# Patient Record
Sex: Male | Born: 1960 | Race: Black or African American | Hispanic: No | Smoking: Heavy tobacco smoker
Health system: Southern US, Community
[De-identification: ages and names within clinical notes are randomized; demographics above are authoritative.]

## PROBLEM LIST (undated history)

## (undated) DIAGNOSIS — J45909 Unspecified asthma, uncomplicated: Secondary | ICD-10-CM

## (undated) DIAGNOSIS — E119 Type 2 diabetes mellitus without complications: Secondary | ICD-10-CM

## (undated) DIAGNOSIS — K219 Gastro-esophageal reflux disease without esophagitis: Secondary | ICD-10-CM

## (undated) DIAGNOSIS — I499 Cardiac arrhythmia, unspecified: Secondary | ICD-10-CM

## (undated) DIAGNOSIS — I509 Heart failure, unspecified: Secondary | ICD-10-CM

## (undated) DIAGNOSIS — F32A Depression, unspecified: Secondary | ICD-10-CM

## (undated) DIAGNOSIS — E7800 Pure hypercholesterolemia, unspecified: Secondary | ICD-10-CM

## (undated) DIAGNOSIS — G8929 Other chronic pain: Secondary | ICD-10-CM

## (undated) DIAGNOSIS — F209 Schizophrenia, unspecified: Secondary | ICD-10-CM

## (undated) DIAGNOSIS — J189 Pneumonia, unspecified organism: Secondary | ICD-10-CM

## (undated) DIAGNOSIS — Z8719 Personal history of other diseases of the digestive system: Secondary | ICD-10-CM

## (undated) DIAGNOSIS — J449 Chronic obstructive pulmonary disease, unspecified: Secondary | ICD-10-CM

## (undated) DIAGNOSIS — J42 Unspecified chronic bronchitis: Secondary | ICD-10-CM

## (undated) DIAGNOSIS — F329 Major depressive disorder, single episode, unspecified: Secondary | ICD-10-CM

## (undated) DIAGNOSIS — M1990 Unspecified osteoarthritis, unspecified site: Secondary | ICD-10-CM

## (undated) DIAGNOSIS — F319 Bipolar disorder, unspecified: Secondary | ICD-10-CM

## (undated) DIAGNOSIS — F419 Anxiety disorder, unspecified: Secondary | ICD-10-CM

## (undated) DIAGNOSIS — M25559 Pain in unspecified hip: Secondary | ICD-10-CM

## (undated) DIAGNOSIS — I10 Essential (primary) hypertension: Secondary | ICD-10-CM

## (undated) HISTORY — PX: CLOSED REDUCTION HAND FRACTURE: SHX973

## (undated) HISTORY — PX: ELBOW FRACTURE SURGERY: SHX616

## (undated) HISTORY — PX: FRACTURE SURGERY: SHX138

---

## 2013-04-28 ENCOUNTER — Encounter (HOSPITAL_BASED_OUTPATIENT_CLINIC_OR_DEPARTMENT_OTHER): Payer: Self-pay | Admitting: Emergency Medicine

## 2013-04-28 ENCOUNTER — Emergency Department (HOSPITAL_BASED_OUTPATIENT_CLINIC_OR_DEPARTMENT_OTHER)
Admission: EM | Admit: 2013-04-28 | Discharge: 2013-04-28 | Disposition: A | Discharge status: Home / Self Care | Payer: Medicaid Other | Attending: Emergency Medicine | Admitting: Emergency Medicine

## 2013-04-28 DIAGNOSIS — J441 Chronic obstructive pulmonary disease with (acute) exacerbation: Secondary | ICD-10-CM | POA: Insufficient documentation

## 2013-04-28 HISTORY — DX: Bipolar disorder, unspecified: F31.9

## 2013-04-28 HISTORY — DX: Heart failure, unspecified: I50.9

## 2013-04-28 HISTORY — DX: Other chronic pain: G89.29

## 2013-04-28 HISTORY — DX: Chronic obstructive pulmonary disease, unspecified: J44.9

## 2013-04-28 HISTORY — DX: Essential (primary) hypertension: I10

## 2013-04-28 HISTORY — DX: Pain in unspecified hip: M25.559

## 2013-04-28 MED ORDER — ALBUTEROL SULFATE (2.5 MG/3ML) 0.083% IN NEBU: 2.5000 mg | INHALATION_SOLUTION | RESPIRATORY_TRACT | Status: DC | PRN

## 2013-04-28 MED ORDER — ALBUTEROL SULFATE (5 MG/ML) 0.5% IN NEBU
15.0000 mg/h | INHALATION_SOLUTION | Freq: Once | RESPIRATORY_TRACT | Status: AC
  Administered 2013-04-28: 15 mg/h via RESPIRATORY_TRACT
  Filled 2013-04-28: qty 3

## 2013-04-28 MED ORDER — PREDNISONE 20 MG PO TABS: 60.0000 mg | ORAL_TABLET | Freq: Every day | ORAL | Status: DC

## 2013-04-28 MED ORDER — ALBUTEROL SULFATE HFA 108 (90 BASE) MCG/ACT IN AERS
2.0000 | INHALATION_SPRAY | RESPIRATORY_TRACT | Status: DC | PRN
  Administered 2013-04-28: 2 via RESPIRATORY_TRACT
  Filled 2013-04-28: qty 6.7

## 2013-04-28 NOTE — ED Provider Notes (Signed)
CSN: [id number]     Arrival date & time 04/28/13  1721 History   First MD Initiated Contact with Patient 04/28/13 1722     No chief complaint on file.  (Consider location/radiation/quality/duration/timing/severity/associated sxs/prior Treatment) HPI Pt with history of ?COPD or asthma who continues to smoke brought to the ED by EMS for 2-3 days of SOB, wheezing and cough but no fever. States he was seen at [facility] recently and discharged. He reports he is at [facility] 'constantly'. 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 Given Albuterol/Atrovent and solu-medrol by EMS with some improvement in symptoms.    No past medical history on file. No past surgical history on file. No family history on file. History  Substance Use Topics  . Smoking status: Not on file  . Smokeless tobacco: Not on file  . Alcohol Use: Not on file    Review of Systems All other systems reviewed and are negative except as noted in HPI.   Allergies  Review of patient's allergies indicates not on file.  Home Medications  No current outpatient prescriptions on file. BP 129/74  Pulse 104  Resp 26  Ht 5\' 11"  (1.803 m)  Wt 174 lb (78.926 kg)  BMI 24.28 kg/m2  SpO2 100% Physical Exam  Nursing note and vitals reviewed. Constitutional: He is oriented to person, place, and time. He appears well-developed and well-nourished.  HENT:  Head: Normocephalic and atraumatic.  Eyes: EOM are normal. Pupils are equal, round, and reactive to light.  Neck: Normal range of motion. Neck supple.  Cardiovascular: Normal rate, normal heart sounds and intact distal pulses.   Pulmonary/Chest: Effort normal. He has wheezes.  Abdominal: Bowel sounds are normal. He exhibits no distension. There is no tenderness.  Musculoskeletal: Normal range of motion. He exhibits no edema and no tenderness.  Neurological: He is alert and oriented to person, place, and time. He has normal strength. No cranial  nerve deficit or sensory deficit.  Skin: Skin is warm and dry. No rash noted.  Psychiatric: He has a normal mood and affect.    ED Course  Procedures (including critical care time) Labs Review Labs Reviewed - No data to display Imaging Review No results found.  EKG Interpretation   None       MDM   1. COPD exacerbation     Review of available medical records from [facility] faxed to [place] shows visits there on the nights of 11/19-20 and 11/23-24. He was treated over night and discharged at the first visit after comprehensive cardiac examination was negative. He left AMA from the second visit after being offered IM solumedrol which he refused. He has been uncooperative at times here, refusing to change into a gown at first and cursing at nursing/EMT staff. On reassessment he is sleeping with continued moderate wheezing and SpO2 92% on RA. He will be given a 15mg  CAT. Pt asking for pain medication, offered APAP or Motrin but he refused saying he can't take either, although review of [place] Controlled Substance Database reveals he takes chronic oxycodone/APAP 10/325 prescribed by his PCP, Dd. Georgena Spurling. No narcotics indicated for this ED visit at this time. Will plan reassessment after CAT.   7:42 PM Pt with improved exam. Still 90-92% on RA but patient states he is close enough to his baseline for discharge. Will give HFA and refill for nebs as well as increasing Prednisone back to 60mg  daily for 5 days with PCP followup.  Magie Ciampa B. Bernette Mayers, MD 04/28/13 1946

## 2013-04-28 NOTE — ED Notes (Signed)
Patient demands his door be left open. Explained privacy regulations that involve keeping patient doors cloised. He states "well I have rights and you gon keep the door open, because I'm bi-polar". Charge RN made aware.

## 2013-04-28 NOTE — ED Notes (Signed)
[facility] report-pt c/o SOB "for days"-released from [facility] 2 days ago for same-upon EMS arrival, pt with audible wheezing-reports he was taking prednisone and nebs x 15-pt initial O2 sat 93%-given A/A neb en route-sats up to 100%-IV started left forearm

## 2013-04-28 NOTE — ED Notes (Signed)
MD at bedside. 

## 2013-04-28 NOTE — ED Notes (Signed)
Patient was asked to get undressed per Dr. Domenica Fail request.  Pt stated he was trying to get in touch with his mother and could not be bothered at this time.  Explained that the Doctor needed to examine him and requested he undress.  Pt took off his nebulizer and threw it on the floor, and stated,"I don't need this shit, I am bipolar and I have not had my medication".  Pt then grabbed the gown out of my hand and told me he does not need any help.  Pt demanded to have the door to his room open, explained that we would open the door after the pt had on his gown.  The door was shut for privacy as this nurse exited the room.  Approximately two minutes later the patient opened the door and was standing at the foot of the bed. Dr. Bernette Mayers notified of activity.

## 2013-08-14 ENCOUNTER — Emergency Department (HOSPITAL_COMMUNITY)
Admission: EM | Admit: 2013-08-14 | Discharge: 2013-08-20 | Disposition: A | Discharge status: Other Acute Inpatient Hospital | Payer: Medicaid Other | Attending: Emergency Medicine | Admitting: Emergency Medicine

## 2013-08-14 ENCOUNTER — Encounter (HOSPITAL_COMMUNITY): Payer: Self-pay | Admitting: Emergency Medicine

## 2013-08-14 ENCOUNTER — Emergency Department (HOSPITAL_COMMUNITY): Payer: Medicaid Other

## 2013-08-14 DIAGNOSIS — R45851 Suicidal ideations: Secondary | ICD-10-CM

## 2013-08-14 DIAGNOSIS — J4489 Other specified chronic obstructive pulmonary disease: Secondary | ICD-10-CM | POA: Insufficient documentation

## 2013-08-14 DIAGNOSIS — R0789 Other chest pain: Secondary | ICD-10-CM | POA: Insufficient documentation

## 2013-08-14 DIAGNOSIS — K469 Unspecified abdominal hernia without obstruction or gangrene: Secondary | ICD-10-CM | POA: Insufficient documentation

## 2013-08-14 DIAGNOSIS — M25559 Pain in unspecified hip: Secondary | ICD-10-CM | POA: Insufficient documentation

## 2013-08-14 DIAGNOSIS — J449 Chronic obstructive pulmonary disease, unspecified: Secondary | ICD-10-CM | POA: Insufficient documentation

## 2013-08-14 DIAGNOSIS — G8929 Other chronic pain: Secondary | ICD-10-CM | POA: Insufficient documentation

## 2013-08-14 DIAGNOSIS — F329 Major depressive disorder, single episode, unspecified: Secondary | ICD-10-CM | POA: Insufficient documentation

## 2013-08-14 DIAGNOSIS — J45909 Unspecified asthma, uncomplicated: Secondary | ICD-10-CM | POA: Insufficient documentation

## 2013-08-14 DIAGNOSIS — R0602 Shortness of breath: Secondary | ICD-10-CM | POA: Insufficient documentation

## 2013-08-14 DIAGNOSIS — I10 Essential (primary) hypertension: Secondary | ICD-10-CM | POA: Insufficient documentation

## 2013-08-14 DIAGNOSIS — F3289 Other specified depressive episodes: Secondary | ICD-10-CM | POA: Insufficient documentation

## 2013-08-14 DIAGNOSIS — I509 Heart failure, unspecified: Secondary | ICD-10-CM | POA: Insufficient documentation

## 2013-08-14 DIAGNOSIS — F319 Bipolar disorder, unspecified: Secondary | ICD-10-CM | POA: Insufficient documentation

## 2013-08-14 HISTORY — DX: Major depressive disorder, single episode, unspecified: F32.9

## 2013-08-14 HISTORY — DX: Depression, unspecified: F32.A

## 2013-08-14 LAB — BASIC METABOLIC PANEL
BUN: 11 mg/dL (ref 6–23)
CO2: 26 meq/L (ref 19–32)
CREATININE: 1.04 mg/dL (ref 0.50–1.35)
Calcium: 9.8 mg/dL (ref 8.4–10.5)
Chloride: 103 mEq/L (ref 96–112)
GFR calc non Af Amer: 80 mL/min — ABNORMAL LOW (ref >90)
Glucose, Bld: 83 mg/dL (ref 70–99)
POTASSIUM: 4.2 meq/L (ref 3.7–5.3)
SODIUM: 143 meq/L (ref 137–147)

## 2013-08-14 LAB — PRO B NATRIURETIC PEPTIDE: PRO B NATRI PEPTIDE: 16.3 pg/mL (ref 0–125)

## 2013-08-14 LAB — LIPASE, BLOOD: Lipase: 23 U/L (ref 11–59)

## 2013-08-14 LAB — RAPID URINE DRUG SCREEN, HOSP PERFORMED
AMPHETAMINES: NOT DETECTED
BENZODIAZEPINES: NOT DETECTED
Barbiturates: NOT DETECTED
COCAINE: POSITIVE — AB
OPIATES: NOT DETECTED
Tetrahydrocannabinol: NOT DETECTED

## 2013-08-14 LAB — CBC
HCT: 44.9 % (ref 39.0–52.0)
Hemoglobin: 14.9 g/dL (ref 13.0–17.0)
MCH: 29.2 pg (ref 26.0–34.0)
MCHC: 33.2 g/dL (ref 30.0–36.0)
MCV: 87.9 fL (ref 78.0–100.0)
Platelets: 294 10*3/uL (ref 150–400)
RBC: 5.11 MIL/uL (ref 4.22–5.81)
RDW: 14.7 % (ref 11.5–15.5)
WBC: 4.6 10*3/uL (ref 4.0–10.5)

## 2013-08-14 LAB — ETHANOL

## 2013-08-14 MED ORDER — ALBUTEROL SULFATE (2.5 MG/3ML) 0.083% IN NEBU
5.0000 mg | INHALATION_SOLUTION | Freq: Once | RESPIRATORY_TRACT | Status: AC
  Administered 2013-08-14: 5 mg via RESPIRATORY_TRACT
  Filled 2013-08-14: qty 6

## 2013-08-14 MED ORDER — DULOXETINE HCL 30 MG PO CPEP
30.0000 mg | ORAL_CAPSULE | Freq: Every day | ORAL | Status: DC
  Administered 2013-08-14 – 2013-08-20 (×7): 30 mg via ORAL
  Filled 2013-08-14 (×8): qty 1

## 2013-08-14 MED ORDER — ATORVASTATIN CALCIUM 20 MG PO TABS
20.0000 mg | ORAL_TABLET | Freq: Every day | ORAL | Status: DC
Start: 2013-08-14 — End: 2013-08-20
  Administered 2013-08-14 – 2013-08-20 (×6): 20 mg via ORAL
  Filled 2013-08-14 (×8): qty 1

## 2013-08-14 MED ORDER — ALBUTEROL SULFATE (2.5 MG/3ML) 0.083% IN NEBU: 2.5000 mg | INHALATION_SOLUTION | RESPIRATORY_TRACT | Status: DC | PRN

## 2013-08-14 MED ORDER — ONDANSETRON HCL 4 MG PO TABS
4.0000 mg | ORAL_TABLET | Freq: Three times a day (TID) | ORAL | Status: DC | PRN
  Administered 2013-08-14 – 2013-08-19 (×6): 4 mg via ORAL
  Filled 2013-08-14 (×6): qty 1

## 2013-08-14 MED ORDER — ACETAMINOPHEN 500 MG PO TABS
1000.0000 mg | ORAL_TABLET | Freq: Once | ORAL | Status: AC
  Administered 2013-08-14: 1000 mg via ORAL
  Filled 2013-08-14: qty 2

## 2013-08-14 MED ORDER — GI COCKTAIL ~~LOC~~
30.0000 mL | Freq: Once | ORAL | Status: AC
  Administered 2013-08-14: 30 mL via ORAL
  Filled 2013-08-14: qty 30

## 2013-08-14 MED ORDER — PANTOPRAZOLE SODIUM 40 MG PO TBEC
40.0000 mg | DELAYED_RELEASE_TABLET | Freq: Every day | ORAL | Status: DC
  Filled 2013-08-14: qty 1

## 2013-08-14 MED ORDER — NICOTINE 21 MG/24HR TD PT24
21.0000 mg | MEDICATED_PATCH | Freq: Every day | TRANSDERMAL | Status: DC
  Filled 2013-08-14: qty 1

## 2013-08-14 MED ORDER — IPRATROPIUM BROMIDE 0.02 % IN SOLN
0.5000 mg | Freq: Once | RESPIRATORY_TRACT | Status: AC
  Administered 2013-08-14: 0.5 mg via RESPIRATORY_TRACT
  Filled 2013-08-14: qty 2.5

## 2013-08-14 MED ORDER — AMLODIPINE BESYLATE 5 MG PO TABS
5.0000 mg | ORAL_TABLET | Freq: Every day | ORAL | Status: DC
  Administered 2013-08-14 – 2013-08-20 (×6): 5 mg via ORAL
  Filled 2013-08-14 (×9): qty 1

## 2013-08-14 MED ORDER — DIVALPROEX SODIUM 250 MG PO DR TAB
1000.0000 mg | DELAYED_RELEASE_TABLET | Freq: Three times a day (TID) | ORAL | Status: DC
  Administered 2013-08-14 – 2013-08-15 (×3): 1000 mg via ORAL
  Filled 2013-08-14 (×4): qty 4

## 2013-08-14 MED ORDER — THEOPHYLLINE ER 300 MG PO CP24
300.0000 mg | ORAL_CAPSULE | Freq: Every day | ORAL | Status: DC
  Filled 2013-08-14 (×2): qty 1

## 2013-08-14 MED ORDER — QUETIAPINE FUMARATE 25 MG PO TABS
150.0000 mg | ORAL_TABLET | Freq: Every day | ORAL | Status: DC
  Administered 2013-08-14: 150 mg via ORAL
  Filled 2013-08-14: qty 6

## 2013-08-14 MED ORDER — PANTOPRAZOLE SODIUM 40 MG PO TBEC
40.0000 mg | DELAYED_RELEASE_TABLET | Freq: Every day | ORAL | Status: DC
  Administered 2013-08-14 – 2013-08-20 (×6): 40 mg via ORAL
  Filled 2013-08-14 (×7): qty 1

## 2013-08-14 MED ORDER — METHYLPREDNISOLONE SODIUM SUCC 125 MG IJ SOLR
125.0000 mg | Freq: Once | INTRAMUSCULAR | Status: AC
  Administered 2013-08-14: 125 mg via INTRAVENOUS
  Filled 2013-08-14: qty 2

## 2013-08-14 MED ORDER — FLUPHENAZINE HCL 5 MG PO TABS
5.0000 mg | ORAL_TABLET | Freq: Every day | ORAL | Status: DC
  Administered 2013-08-14 – 2013-08-20 (×7): 5 mg via ORAL
  Filled 2013-08-14 (×9): qty 1

## 2013-08-14 MED ORDER — THEOPHYLLINE ER 300 MG PO TB12
300.0000 mg | ORAL_TABLET | Freq: Every day | ORAL | Status: DC
  Administered 2013-08-14 – 2013-08-20 (×7): 300 mg via ORAL
  Filled 2013-08-14 (×8): qty 1

## 2013-08-14 NOTE — ED Notes (Signed)
Pt reports he's had tightness in his chest since last night. He says he vomited 3 times last night and noticed blood. He says he has a hx of hiatal hernia and copd. He thinks "they are acting up."

## 2013-08-14 NOTE — ED Notes (Signed)
IV team paged and responded.  Dr Judd Lienelo aware.

## 2013-08-14 NOTE — ED Provider Notes (Addendum)
CSN: [id number]     Arrival date & time 08/14/13  0905 History   First MD Initiated Contact with Patient 08/14/13 [phone]     Chief Complaint  Patient presents with  . Chest Pain     (Consider location/radiation/quality/duration/timing/severity/associated sxs/prior Treatment) HPI Comments: Patient is a 53 year old male with history of COPD and congestive heart failure. He is [facility] and it sounds as though he is treated there quite frequently. He presents here today with complaints of difficulty breathing. He also complains of burning of the epigastric region and throat. When he has difficulty breathing, he states that he makes himself throw up and this sometimes helps. He induced vomiting and has noted a slight amount of blood. He denies any radiation of this discomfort to the arm or jaw. He feels short of breath but denies diaphoresis. He tells me that they always admit him at [facility] when he has the symptoms, otherwise he always has to come back to the ER.  Patient is a 53 y.o. male presenting with chest pain. The history is provided by the patient.  Chest Pain Pain location:  Epigastric Pain quality: burning   Pain radiates to:  Does not radiate Pain radiates to the back: no   Pain severity:  Moderate Onset quality:  Gradual Timing:  Constant Progression:  Worsening Chronicity:  New Relieved by:  Nothing Worsened by:  Nothing tried Ineffective treatments:  None tried   Past Medical History  Diagnosis Date  . Asthma   . CHF (congestive heart failure)   . COPD (chronic obstructive pulmonary disease)   . Hypertension   . Bipolar disorder   . Hernia   . Chronic hip pain    Past Surgical History  Procedure Laterality Date  . Hand surgery    . Shoulder surgery     History reviewed. No pertinent family history. History  Substance Use Topics  . Smoking status: Current Every Day Smoker  . Smokeless tobacco: Not on file  . Alcohol  Use: Yes    Review of Systems  Cardiovascular: Positive for chest pain.  All other systems reviewed and are negative.      Allergies  Aspirin and Penicillins  Home Medications   Current Outpatient Rx  Name  Route  Sig  Dispense  Refill  . albuterol (PROVENTIL) (2.5 MG/3ML) 0.083% nebulizer solution   Nebulization   Take 3 mLs (2.5 mg total) by nebulization every 4 (four) hours as needed for wheezing or shortness of breath.   10 vial   0   . amLODipine (NORVASC) 5 MG tablet   Oral   Take 5 mg by mouth daily.         Marland Kitchen. atorvastatin (LIPITOR) 20 MG tablet   Oral   Take 20 mg by mouth daily.         . divalproex (DEPAKOTE) 500 MG DR tablet   Oral   Take 1,000 mg by mouth 3 (three) times daily.         . DULoxetine (CYMBALTA) 30 MG capsule   Oral   Take 30 mg by mouth daily.         . fluPHENAZine (PROLIXIN) 5 MG tablet   Oral   Take 5 mg by mouth daily.         Marland Kitchen. omeprazole (PRILOSEC) 40 MG capsule   Oral   Take 40 mg by mouth daily.         . predniSONE (DELTASONE)  20 MG tablet   Oral   Take 3 tablets (60 mg total) by mouth daily.   15 tablet   0   . predniSONE (STERAPRED UNI-PAK) 10 MG tablet   Oral   Take by mouth daily.         . QUEtiapine (SEROQUEL) 100 MG tablet   Oral   Take 150 mg by mouth at bedtime.         . sulfamethoxazole-trimethoprim (BACTRIM DS) 800-160 MG per tablet   Oral   Take 1 tablet by mouth 2 (two) times daily.         . theophylline (THEO-24) 300 MG 24 hr capsule   Oral   Take 300 mg by mouth daily.          BP 113/67  Pulse 67  Temp(Src) 98.6 F (37 C) (Oral)  Resp 17  Ht 5\' 11"  (1.803 m)  Wt 175 lb 11.2 oz (79.697 kg)  BMI 24.52 kg/m2  SpO2 95% Physical Exam  Nursing note and vitals reviewed. Constitutional: He is oriented to person, place, and time. He appears well-developed and well-nourished. No distress.  HENT:  Head: Normocephalic.  Mouth/Throat: Oropharynx is clear and moist.  Neck:  Normal range of motion. Neck supple.  Cardiovascular: Normal rate, regular rhythm and normal heart sounds.   No murmur heard. Pulmonary/Chest: Effort normal. He has wheezes. He has no rales. He exhibits no tenderness.  There are slight rales present bilaterally. There is no respiratory distress or tachypnea.  Abdominal: Soft. Bowel sounds are normal. He exhibits no distension. There is no tenderness.  Musculoskeletal: Normal range of motion. He exhibits no edema.  Neurological: He is alert and oriented to person, place, and time.  Skin: Skin is warm and dry. He is not diaphoretic.    ED Course  Procedures (including critical care time) Labs Review Labs Reviewed  CBC  BASIC METABOLIC PANEL  PRO B NATRIURETIC PEPTIDE  LIPASE, BLOOD  I-STAT TROPOININ, ED   Imaging Review No results found.   EKG Interpretation   Date/Time:  Saturday August 14 2013 09:11:37 EDT Ventricular Rate:  57 PR Interval:  150 QRS Duration: 80 QT Interval:  406 QTC Calculation: 395 R Axis:   68 Text Interpretation:  Sinus bradycardia Otherwise normal ECG Confirmed by  DELOS  MD, Avion Kutzer ([id number]) on 08/14/2013 9:40:01 AM      MDM   Final diagnoses:  None    Patient is a 53 year old male with history of CHF COPD hypertension and bipolar. He is currently at a drug abuse treatment center in [place]. He is normally a patient of regional physicians in [facility]. He presents today initially with complaints of tightness in his chest and difficulty breathing. He is given an albuterol treatment and Solu-Medrol and appears to be feeling better. His oxygen saturations are in the upper 90s and his respiratory rate is very normal. He appears to be medically stable.  Is now complaining that he feels suicidal and depressed. States his mother died 2 weeks ago and he has a lot going on. He feels as though he will require hospitalization. TTS will be consult with and they will give the final disposition. From my  standpoint, I feel as though he is medically cleared for this.    Geoffery Lyons, MD 08/14/13 1422  Geoffery Lyons, MD 08/14/13 1515
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
## 2013-08-14 NOTE — ED Notes (Signed)
2 attempts at IV placement with no success by this rn.

## 2013-08-15 LAB — CBG MONITORING, ED: Glucose-Capillary: 104 mg/dL — ABNORMAL HIGH (ref 70–99)

## 2013-08-15 LAB — TROPONIN I: Troponin I: <0.3 ng/mL (ref <0.30)

## 2013-08-15 MED ORDER — QUETIAPINE FUMARATE 25 MG PO TABS
100.0000 mg | ORAL_TABLET | Freq: Every day | ORAL | Status: DC
  Administered 2013-08-18 – 2013-08-19 (×2): 100 mg via ORAL
  Filled 2013-08-15 (×4): qty 4

## 2013-08-15 MED ORDER — PANTOPRAZOLE SODIUM 40 MG PO TBEC
40.0000 mg | DELAYED_RELEASE_TABLET | Freq: Once | ORAL | Status: AC
  Administered 2013-08-15: 40 mg via ORAL

## 2013-08-15 MED ORDER — DIVALPROEX SODIUM 250 MG PO DR TAB
500.0000 mg | DELAYED_RELEASE_TABLET | Freq: Three times a day (TID) | ORAL | Status: DC
  Administered 2013-08-16 – 2013-08-20 (×14): 500 mg via ORAL
  Filled 2013-08-15 (×15): qty 2

## 2013-08-15 MED ORDER — PROMETHAZINE HCL 25 MG/ML IJ SOLN
25.0000 mg | Freq: Once | INTRAMUSCULAR | Status: AC
  Administered 2013-08-15: 25 mg via INTRAMUSCULAR
  Filled 2013-08-15: qty 1

## 2013-08-15 MED ORDER — GI COCKTAIL ~~LOC~~
30.0000 mL | Freq: Once | ORAL | Status: AC
  Administered 2013-08-15: 30 mL via ORAL
  Filled 2013-08-15: qty 30

## 2013-08-15 NOTE — Progress Notes (Signed)
No Case Manager needs identified awaits placement.

## 2013-08-15 NOTE — ED Provider Notes (Signed)
Filed Vitals:   08/15/13 0627  BP: 127/91  Pulse: 83  Temp:   Resp: 17   Pt is resting comfortably, no issues overnight.  [facility], has problems with drugs, has been depressed and SI passively due to passing of mother a few weeks ago.  TTS assessment recommends inpt admission.  Awaiting placement.      Gavin PoundMichael Y. Oletta LamasGhim, MD 08/15/13 [id number]

## 2013-08-15 NOTE — Progress Notes (Signed)
Spoke with pt's ACT team member at [facility] through their crisis line [phone]) with pt's permission, team members name is Charmin.  Team member states that pt just had an appointment on Thursday or Friday with his therapist and things seemed fine with him.  Stated that pt frequents the ED's and most of the time just needs a ride back home in which if that's the case will get him a voucher for.  Team member was given the number to the ED and name to pt's nurse.  Stated she will call the ED to get more information form pt and update TTS.   Francisco Beard Disposition [facility]

## 2013-08-15 NOTE — Progress Notes (Addendum)
Spoke with pt's ACT team member, Charmin, who reported talked with pt and was told that pt was no longer SI/HI but [facility], pt has a therapist that he meets with once a week and has an appointment coming up this week either Monday or Tuesday.   ACT team member then reported she asked pt if she would like for her to come and pick him up and pt stated after his stomach feels better then he would be ready to go.   Pt was then re-assessed by counselor here in TTS but reported to her that he was still feeling SI/HI.  TTS will continue to seek inpt placement for pt.  Again spoke with Charmin, pts ACT team member, after re-assessment.  Reported that at this point pt still meets criteria for inptx and TTS will continue to seek placement for him.  Phone number [phone] was given to TTS in case we should need to contact them at any time.  Stated they could help with finding placement for him and had been trying to get pt into [facility] just this past week.  TTS number was given to ACT team, will pass on to oncoming shift.    Tomi BambergerMariya Jesseka Drinkard Disposition MHT

## 2013-08-15 NOTE — Progress Notes (Addendum)
MHT initiated inpatient placement for treatment at the following hospitals:  1)[facility]-declined no outside referrals per Dannielle Huhanny, [facility] when [place] told to fax 2)[facility]-declined due to acuity 3)[facility]-fax referral 4)[facility]-under review 5)[facility]-faxed referral 6)[facility]  [facility] ZOXW#[id number]#[id number].  Blain PaisMichelle L Najir Roop, MHT/NS

## 2013-08-15 NOTE — BH Assessment (Signed)
[facility] Assessment Progress Note  Spoke with pt via tele assessment, and he states that he is not feeling well and hasn't been able to eat or drink since arriving in the ED, and is vomiting blood.  Rn, Kriste BasqueBecky is aware.   Pt states that he is continuing to think of his mom as he is lying there and is still suicidal and homicidal.  TTS will continue to seek IP placement.

## 2013-08-16 MED ORDER — ONDANSETRON 4 MG PO TBDP
4.0000 mg | ORAL_TABLET | Freq: Once | ORAL | Status: AC
  Administered 2013-08-16: 4 mg via ORAL
  Filled 2013-08-16: qty 1

## 2013-08-16 MED ORDER — GI COCKTAIL ~~LOC~~
30.0000 mL | Freq: Three times a day (TID) | ORAL | Status: DC | PRN
  Administered 2013-08-16 – 2013-08-18 (×6): 30 mL via ORAL
  Filled 2013-08-16 (×9): qty 30

## 2013-08-16 NOTE — ED Provider Notes (Signed)
Pt alert, content, no distress. Vitals normal. Pt states feeling somewhat better, but still depressed.  Pt is from [place], and references multiple visits to their ED/hospital - ?whether patients current/recent symptoms more chronic than acute. Will order telepsychiatrist reassessment.  Psych team dispo remains pending.    Suzi RootsKevin E Mysty Kielty, MD 08/16/13 [phone]

## 2013-08-16 NOTE — Progress Notes (Signed)
MHT contacted the following hospitals with bed availability for psychiatry:  1)[facility]-declined due to acuity 2)[facility]- 3)[facility]-no beds 4)[facility]-no beds 5)[facility]-no beds 6)[facility]-declined due to acuity 7)[facility]-no beds 8)[facility]-pt accepted to waitlist per Britta MccreedyBarbara, RN  Blain PaisMichelle L Halle Davlin, MHT/NS

## 2013-08-16 NOTE — ED Notes (Signed)
Pt reports indigestion, states GI cocktail has helped in the past. Dr. Anitra LauthPlunkett made aware

## 2013-08-16 NOTE — ED Notes (Signed)
PT had 300mL emesis. Dr. Anitra LauthPlunkett made aware.

## 2013-08-16 NOTE — Progress Notes (Signed)
B.Sacred Roa, MHT completed placement search by contacting the following facilities listed below;  [facility] faxed for review [facility] fax[facility] for review [facility] fax[facility] for review [facility] fax[facility] for review 

## 2013-08-16 NOTE — ED Notes (Signed)
States he has not been drinking anything since he has been here-- continues to c/o nausea-- requesting GI Cocktail

## 2013-08-16 NOTE — Progress Notes (Signed)
[facility] as [facility] stated cannot handle dual diagnosis.  Demographics called into [place], RN with [facility] [id number]#[id number].  Blain PaisMichelle L Lawrance Wiedemann, MHT/NS

## 2013-08-17 ENCOUNTER — Inpatient Hospital Stay (HOSPITAL_COMMUNITY): Admission: AD | Admit: 2013-08-17 | Payer: Self-pay | Source: Intra-hospital | Admitting: Psychiatry

## 2013-08-17 ENCOUNTER — Encounter (HOSPITAL_COMMUNITY): Payer: Self-pay | Admitting: Family

## 2013-08-17 DIAGNOSIS — F1010 Alcohol abuse, uncomplicated: Secondary | ICD-10-CM

## 2013-08-17 DIAGNOSIS — R45851 Suicidal ideations: Secondary | ICD-10-CM

## 2013-08-17 DIAGNOSIS — F3130 Bipolar disorder, current episode depressed, mild or moderate severity, unspecified: Secondary | ICD-10-CM

## 2013-08-17 DIAGNOSIS — F1994 Other psychoactive substance use, unspecified with psychoactive substance-induced mood disorder: Secondary | ICD-10-CM

## 2013-08-17 DIAGNOSIS — R45850 Homicidal ideations: Secondary | ICD-10-CM

## 2013-08-17 NOTE — Progress Notes (Signed)
Per, Nani SkillernJohn Conrad Withrow, NP the patient meets criteria for inpatient hospitalization on a [address].  [facility]  The Dispo Tech Smitty Cords(Bruce) has referred the patient for placement.

## 2013-08-17 NOTE — ED Notes (Signed)
Patient ambulated to the BR using the walker, back to bed.  Requested something for his indigestion.
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
## 2013-08-17 NOTE — Progress Notes (Signed)
The patient will receive a Tele Psych at 1:00pm with the NP, Nani SkillernJohn Conrad Withrow.

## 2013-08-17 NOTE — ED Notes (Signed)
PT reports central epigastric pain and nausea. Going to give GI cocktail as this seems to work best for the PT

## 2013-08-17 NOTE — Treatment Plan (Addendum)
On 3/14 [facility] and declined by out on-call extender due to acuity related to inability for care for himself.  Pt needs home health nurse for med administration and ADL's due to chronic medical conditions.  Pt. Still needs inpatient treatment and it will be sought in a more appropriate hospital elsewhere by the disposition tech.

## 2013-08-18 NOTE — ED Notes (Signed)
C/o abd pain-- no vomiting

## 2013-08-18 NOTE — Progress Notes (Signed)
[facility] per, Leotis ShamesJeffery.  The [employer] [employer] has referred the patient to the following facilities Review:[facility], [facility], [facility], [facility], [address], [facility], [facility], [facility], [facility].
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
## 2013-08-18 NOTE — ED Notes (Signed)
Dinner tray delivered.

## 2013-08-19 MED ORDER — GI COCKTAIL ~~LOC~~
30.0000 mL | Freq: Three times a day (TID) | ORAL | Status: DC | PRN
  Administered 2013-08-19 (×2): 30 mL via ORAL
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
## 2013-08-19 NOTE — ED Provider Notes (Signed)
Patient calm this AM, eating breakfast. No issues as per nursing. Psych saw patient yesterday, recommend admission for suicidal, homicidal ideations. Pending placement today.   Richardean Canalavid H Yao, MD 08/19/13 [phone]

## 2013-08-19 NOTE — ED Notes (Signed)
Pt requests to not have his meds now. Pt negotiated to have his meds in 20 minutes.

## 2013-08-19 NOTE — ED Notes (Signed)
Pt reports belly pain, pt asking for medications to relieve belly pain.

## 2013-08-19 NOTE — ED Notes (Signed)
Patient is resting comfortably, with sitter at the bedside. 

## 2013-08-19 NOTE — ED Notes (Signed)
telepsych being done 

## 2013-08-20 LAB — VALPROIC ACID LEVEL: Valproic Acid Lvl: 49.6 ug/mL — ABNORMAL LOW (ref 50.0–100.0)

## 2013-08-20 NOTE — ED Provider Notes (Signed)
5:31 PM Pt accepted by [facility], Wende CreaseJane Russom, RN with [facility] team accepting. I was not directly involved in pt care or dispo planning.   1. Suicidal ideations      Shanna CiscoMegan E Docherty, MD 08/20/13 [phone]

## 2013-08-20 NOTE — Discharge Instructions (Signed)
Suicidal Feelings, How to Help Yourself °Everyone feels sad or unhappy at times, but depressing thoughts and feelings of hopelessness can lead to thoughts of suicide. It can seem as if life is too tough to handle. If you feel as though you have reached the point where suicide is the only answer, it is time to let someone know immediately.  °HOW TO COPE AND PREVENT SUICIDE °· Let family, friends, teachers, or counselors know. Get help. Try not to isolate yourself from those who care about you. Even though you may not feel sociable, talk with someone every day. It is best if it is face-to-face. Remember, they will want to help you. °· Eat a regularly spaced and well-balanced diet. °· Get plenty of rest. °· Avoid alcohol and drugs because they will only make you feel worse and may also lower your inhibitions. Remove them from the home. If you are thinking of taking an overdose of your prescribed medicines, give your medicines to someone who can give them to you one day at a time. If you are on antidepressants, let your caregiver know of your feelings so he or she can provide a safer medicine, if that is a concern. °· Remove weapons or poisons from your home. °· Try to stick to routines. Follow a schedule and remind yourself that you have to keep that schedule every day. °· Set some realistic goals and achieve them. Make a list and cross things off as you go. Accomplishments give a sense of worth. Wait until you are feeling better before doing things you find difficult or unpleasant to do. °· If you are able, try to start exercising. Even half-hour periods of exercise each day will make you feel better. Getting out in the sun or into nature helps you recover from depression faster. If you have a favorite place to walk, take advantage of that. °· Increase safe activities that have always given you pleasure. This may include playing your favorite music, reading a good book, painting a picture, or playing your favorite  instrument. Do whatever takes your mind off your depression. °· Keep your living space well-lighted. °GET HELP °Contact a suicide hotline, crisis center, or local suicide prevention center for help right away. Local centers may include a hospital, clinic, community service organization, social service provider, or health department. °· Call your local emergency services (911 in the [place]). °· Call a suicide hotline: °· 1-800-273-TALK (1-800-273-8255) in the [place]. °· 1-800-SUICIDE (1-800-784-2433) in the [place]. °· 1-888-628-9454 in the [place] for Spanish-speaking counselors. °· 1-800-799-4TTY (1-800-799-4889) in the [place] for TTY users. °· Visit the following websites for information and help: °· National Suicide Prevention Lifeline: www.suicidepreventionlifeline.org °· Hopeline: www.hopeline.com °· American Foundation for Suicide Prevention: www.afsp.org °· For lesbian, gay, bisexual, transgender, or questioning youth, contact The Trevor Project: °· 1-866-4-U-TREVOR (1-866-488-7386) in the [place]. °· www.thetrevorproject.org °· In [place], treatment resources are listed in each province with listings available under [employer] or similar titles. Another source for Crisis Centres by Province is located at http://www.suicideprevention.ca/in-crisis-now/find-a-crisis-centre-now/crisis-centres °Document Released: 11/24/2002 Document Revised: 08/12/2011 Document Reviewed: 04/14/2007 °ExitCare® Patient Information ©2014 [employer]. ° °

## 2013-08-20 NOTE — Progress Notes (Signed)
[facility] report [facility] at any time.  She was accepted per Lorine Bearsonnie Mullins, RN.  RN report#[phone].  Donnella ShamMichelle L Dianne Bady,MHT/NS

## 2013-08-20 NOTE — Progress Notes (Signed)
CSW spoke with pt who shared that he has [facility]. Pt gave CSW permission to speak with team and they will send over a representative from the team to see him. Peyton NajjarLarry, [place] counselor from [facility] to spoke with pt and pt voices he would [facility]. This information shared with Dietitianrogram Manager at [facility],Darrell Rivers. Mr Anda KraftRivers reported that they are meeting in regards to pt to day to determine if he meets requirements to remain in program because of pt.'s physical and medical issues. Pt [facility]. Pt is homeless.   [address]Salam Micucci, [place] [phone]

## 2013-08-20 NOTE — ED Notes (Signed)
[facility] HAS ARRIVED TO TRANSPORT PT TO [facility]. HE WAS GIVEN A  SANDWICH AND SODA BEFORE TRANSPORT

## 2016-04-01 ENCOUNTER — Inpatient Hospital Stay (HOSPITAL_COMMUNITY)
Admission: EM | Admit: 2016-04-01 | Discharge: 2016-04-04 | DRG: 191 | Disposition: A | Discharge status: Home / Self Care | Payer: Medicaid Other | Attending: Family Medicine | Admitting: Family Medicine

## 2016-04-01 ENCOUNTER — Encounter (HOSPITAL_COMMUNITY): Payer: Self-pay

## 2016-04-01 ENCOUNTER — Emergency Department (HOSPITAL_COMMUNITY): Payer: Medicaid Other

## 2016-04-01 DIAGNOSIS — Z880 Allergy status to penicillin: Secondary | ICD-10-CM | POA: Diagnosis not present

## 2016-04-01 DIAGNOSIS — Z9114 Patient's other noncompliance with medication regimen: Secondary | ICD-10-CM | POA: Diagnosis not present

## 2016-04-01 DIAGNOSIS — M879 Osteonecrosis, unspecified: Secondary | ICD-10-CM | POA: Diagnosis present

## 2016-04-01 DIAGNOSIS — Z7951 Long term (current) use of inhaled steroids: Secondary | ICD-10-CM

## 2016-04-01 DIAGNOSIS — I10 Essential (primary) hypertension: Secondary | ICD-10-CM | POA: Diagnosis present

## 2016-04-01 DIAGNOSIS — W19XXXA Unspecified fall, initial encounter: Secondary | ICD-10-CM

## 2016-04-01 DIAGNOSIS — M87051 Idiopathic aseptic necrosis of right femur: Secondary | ICD-10-CM | POA: Diagnosis not present

## 2016-04-01 DIAGNOSIS — F259 Schizoaffective disorder, unspecified: Secondary | ICD-10-CM | POA: Diagnosis present

## 2016-04-01 DIAGNOSIS — R0602 Shortness of breath: Secondary | ICD-10-CM | POA: Diagnosis present

## 2016-04-01 DIAGNOSIS — Z79899 Other long term (current) drug therapy: Secondary | ICD-10-CM | POA: Diagnosis not present

## 2016-04-01 DIAGNOSIS — F17210 Nicotine dependence, cigarettes, uncomplicated: Secondary | ICD-10-CM | POA: Diagnosis present

## 2016-04-01 DIAGNOSIS — M25551 Pain in right hip: Secondary | ICD-10-CM | POA: Diagnosis present

## 2016-04-01 DIAGNOSIS — F319 Bipolar disorder, unspecified: Secondary | ICD-10-CM | POA: Diagnosis present

## 2016-04-01 DIAGNOSIS — E785 Hyperlipidemia, unspecified: Secondary | ICD-10-CM | POA: Diagnosis present

## 2016-04-01 DIAGNOSIS — K219 Gastro-esophageal reflux disease without esophagitis: Secondary | ICD-10-CM | POA: Diagnosis present

## 2016-04-01 DIAGNOSIS — Z91018 Allergy to other foods: Secondary | ICD-10-CM | POA: Diagnosis not present

## 2016-04-01 DIAGNOSIS — G8929 Other chronic pain: Secondary | ICD-10-CM | POA: Diagnosis present

## 2016-04-01 DIAGNOSIS — Z888 Allergy status to other drugs, medicaments and biological substances status: Secondary | ICD-10-CM

## 2016-04-01 DIAGNOSIS — J441 Chronic obstructive pulmonary disease with (acute) exacerbation: Secondary | ICD-10-CM | POA: Diagnosis present

## 2016-04-01 LAB — BASIC METABOLIC PANEL
ANION GAP: 8 (ref 5–15)
BUN: 15 mg/dL (ref 6–20)
CALCIUM: 9.4 mg/dL (ref 8.9–10.3)
CO2: 22 mmol/L (ref 22–32)
Chloride: 113 mmol/L — ABNORMAL HIGH (ref 101–111)
Creatinine, Ser: 1.08 mg/dL (ref 0.61–1.24)
GLUCOSE: 85 mg/dL (ref 65–99)
POTASSIUM: 4 mmol/L (ref 3.5–5.1)
SODIUM: 143 mmol/L (ref 135–145)

## 2016-04-01 LAB — CBC
HCT: 43.8 % (ref 39.0–52.0)
HEMOGLOBIN: 14.6 g/dL (ref 13.0–17.0)
MCH: 28.1 pg (ref 26.0–34.0)
MCHC: 33.3 g/dL (ref 30.0–36.0)
MCV: 84.4 fL (ref 78.0–100.0)
Platelets: 255 10*3/uL (ref 150–400)
RBC: 5.19 MIL/uL (ref 4.22–5.81)
RDW: 15 % (ref 11.5–15.5)
WBC: 6 10*3/uL (ref 4.0–10.5)

## 2016-04-01 LAB — I-STAT TROPONIN, ED: TROPONIN I, POC: 0 ng/mL (ref 0.00–0.08)

## 2016-04-01 LAB — BRAIN NATRIURETIC PEPTIDE: B NATRIURETIC PEPTIDE 5: 43.2 pg/mL (ref 0.0–100.0)

## 2016-04-01 LAB — GLUCOSE, CAPILLARY: GLUCOSE-CAPILLARY: 321 mg/dL — AB (ref 65–99)

## 2016-04-01 MED ORDER — MORPHINE SULFATE (PF) 4 MG/ML IV SOLN
4.0000 mg | Freq: Once | INTRAVENOUS | Status: AC
  Administered 2016-04-01: 4 mg via INTRAVENOUS
  Filled 2016-04-01: qty 1

## 2016-04-01 MED ORDER — PANTOPRAZOLE SODIUM 40 MG PO TBEC
40.0000 mg | DELAYED_RELEASE_TABLET | Freq: Two times a day (BID) | ORAL | Status: DC
  Administered 2016-04-01 – 2016-04-04 (×6): 40 mg via ORAL
  Filled 2016-04-01 (×6): qty 1

## 2016-04-01 MED ORDER — ONDANSETRON HCL 4 MG/2ML IJ SOLN: 4.0000 mg | Freq: Three times a day (TID) | INTRAMUSCULAR | Status: AC | PRN

## 2016-04-01 MED ORDER — THEOPHYLLINE ER 300 MG PO CP24
300.0000 mg | ORAL_CAPSULE | Freq: Every day | ORAL | Status: DC
  Filled 2016-04-01: qty 1

## 2016-04-01 MED ORDER — ALBUTEROL (5 MG/ML) CONTINUOUS INHALATION SOLN
10.0000 mg/h | INHALATION_SOLUTION | RESPIRATORY_TRACT | Status: DC
  Administered 2016-04-01: 10 mg/h via RESPIRATORY_TRACT
  Filled 2016-04-01: qty 20

## 2016-04-01 MED ORDER — ALBUTEROL SULFATE HFA 108 (90 BASE) MCG/ACT IN AERS: 1.0000 | INHALATION_SPRAY | RESPIRATORY_TRACT | Status: DC | PRN

## 2016-04-01 MED ORDER — DULOXETINE HCL 30 MG PO CPEP
30.0000 mg | ORAL_CAPSULE | Freq: Every day | ORAL | Status: DC
  Administered 2016-04-01 – 2016-04-04 (×4): 30 mg via ORAL
  Filled 2016-04-01 (×4): qty 1

## 2016-04-01 MED ORDER — ENOXAPARIN SODIUM 40 MG/0.4ML ~~LOC~~ SOLN
40.0000 mg | SUBCUTANEOUS | Status: DC
  Filled 2016-04-01 (×2): qty 0.4

## 2016-04-01 MED ORDER — FLUPHENAZINE HCL 10 MG PO TABS: 10.0000 mg | ORAL_TABLET | Freq: Every day | ORAL | Status: DC

## 2016-04-01 MED ORDER — PREDNISONE 20 MG PO TABS
50.0000 mg | ORAL_TABLET | Freq: Every day | ORAL | Status: DC
  Administered 2016-04-02 – 2016-04-04 (×3): 50 mg via ORAL
  Filled 2016-04-01 (×3): qty 3

## 2016-04-01 MED ORDER — THEOPHYLLINE ER 300 MG PO TB12
300.0000 mg | ORAL_TABLET | Freq: Every evening | ORAL | Status: DC
  Administered 2016-04-01: 300 mg via ORAL
  Filled 2016-04-01 (×2): qty 1

## 2016-04-01 MED ORDER — AMANTADINE HCL 100 MG PO CAPS
100.0000 mg | ORAL_CAPSULE | Freq: Three times a day (TID) | ORAL | Status: DC
  Administered 2016-04-01 – 2016-04-03 (×5): 100 mg via ORAL
  Filled 2016-04-01 (×5): qty 1

## 2016-04-01 MED ORDER — ACETAMINOPHEN 325 MG PO TABS
650.0000 mg | ORAL_TABLET | Freq: Three times a day (TID) | ORAL | Status: DC
  Administered 2016-04-01 – 2016-04-04 (×8): 650 mg via ORAL
  Filled 2016-04-01 (×8): qty 2

## 2016-04-01 MED ORDER — LORATADINE 10 MG PO TABS: 10.0000 mg | ORAL_TABLET | Freq: Every day | ORAL | Status: DC | PRN

## 2016-04-01 MED ORDER — ALBUTEROL SULFATE (2.5 MG/3ML) 0.083% IN NEBU
2.5000 mg | INHALATION_SOLUTION | RESPIRATORY_TRACT | Status: AC
  Administered 2016-04-01 – 2016-04-02 (×3): 2.5 mg via RESPIRATORY_TRACT
  Filled 2016-04-01 (×4): qty 3

## 2016-04-01 MED ORDER — HYDROCODONE-ACETAMINOPHEN 5-325 MG PO TABS
1.0000 | ORAL_TABLET | Freq: Four times a day (QID) | ORAL | Status: DC | PRN
  Administered 2016-04-02 – 2016-04-04 (×6): 1 via ORAL
  Filled 2016-04-01 (×6): qty 1

## 2016-04-01 MED ORDER — ALBUTEROL SULFATE (2.5 MG/3ML) 0.083% IN NEBU
5.0000 mg | INHALATION_SOLUTION | Freq: Once | RESPIRATORY_TRACT | Status: AC
  Administered 2016-04-01: 5 mg via RESPIRATORY_TRACT
  Filled 2016-04-01: qty 6

## 2016-04-01 MED ORDER — ONDANSETRON HCL 4 MG/2ML IJ SOLN
4.0000 mg | Freq: Once | INTRAMUSCULAR | Status: AC
  Administered 2016-04-01: 4 mg via INTRAVENOUS
  Filled 2016-04-01: qty 2

## 2016-04-01 MED ORDER — MONTELUKAST SODIUM 10 MG PO TABS
10.0000 mg | ORAL_TABLET | Freq: Every day | ORAL | Status: DC
  Administered 2016-04-01 – 2016-04-03 (×3): 10 mg via ORAL
  Filled 2016-04-01 (×3): qty 1

## 2016-04-01 MED ORDER — ATORVASTATIN CALCIUM 10 MG PO TABS
20.0000 mg | ORAL_TABLET | Freq: Every day | ORAL | Status: DC
  Administered 2016-04-01 – 2016-04-03 (×3): 20 mg via ORAL
  Filled 2016-04-01 (×3): qty 2

## 2016-04-01 MED ORDER — HYDROMORPHONE HCL 2 MG/ML IJ SOLN
1.0000 mg | INTRAMUSCULAR | Status: AC | PRN
  Administered 2016-04-01 – 2016-04-02 (×2): 1 mg via INTRAVENOUS
  Filled 2016-04-01 (×2): qty 1

## 2016-04-01 MED ORDER — INSULIN ASPART 100 UNIT/ML ~~LOC~~ SOLN
5.0000 [IU] | Freq: Once | SUBCUTANEOUS | Status: AC
  Administered 2016-04-02: 5 [IU] via SUBCUTANEOUS

## 2016-04-01 MED ORDER — TIOTROPIUM BROMIDE MONOHYDRATE 18 MCG IN CAPS
18.0000 ug | ORAL_CAPSULE | Freq: Every day | RESPIRATORY_TRACT | Status: DC
  Administered 2016-04-02 – 2016-04-04 (×3): 18 ug via RESPIRATORY_TRACT
  Filled 2016-04-01 (×2): qty 5

## 2016-04-01 MED ORDER — ALBUTEROL SULFATE (2.5 MG/3ML) 0.083% IN NEBU
2.5000 mg | INHALATION_SOLUTION | RESPIRATORY_TRACT | Status: DC | PRN
  Administered 2016-04-02 (×3): 2.5 mg via RESPIRATORY_TRACT
  Filled 2016-04-01 (×3): qty 3

## 2016-04-01 MED ORDER — FOLIC ACID 1 MG PO TABS
1.0000 mg | ORAL_TABLET | Freq: Every day | ORAL | Status: DC
  Administered 2016-04-02 – 2016-04-04 (×3): 1 mg via ORAL
  Filled 2016-04-01 (×3): qty 1

## 2016-04-01 MED ORDER — SUCRALFATE 1 G PO TABS
1.0000 g | ORAL_TABLET | Freq: Three times a day (TID) | ORAL | Status: DC
  Administered 2016-04-01 – 2016-04-04 (×11): 1 g via ORAL
  Filled 2016-04-01 (×11): qty 1

## 2016-04-01 MED ORDER — METHYLPREDNISOLONE SODIUM SUCC 125 MG IJ SOLR
125.0000 mg | Freq: Once | INTRAMUSCULAR | Status: AC
  Administered 2016-04-01: 125 mg via INTRAVENOUS
  Filled 2016-04-01: qty 2

## 2016-04-01 MED ORDER — DICLOFENAC SODIUM 1 % TD GEL
4.0000 g | Freq: Four times a day (QID) | TRANSDERMAL | Status: DC
  Filled 2016-04-01: qty 100

## 2016-04-01 MED ORDER — AZITHROMYCIN 500 MG PO TABS
250.0000 mg | ORAL_TABLET | ORAL | Status: DC
  Administered 2016-04-02 – 2016-04-03 (×2): 250 mg via ORAL
  Filled 2016-04-01 (×2): qty 1

## 2016-04-01 MED ORDER — MOMETASONE FURO-FORMOTEROL FUM 200-5 MCG/ACT IN AERO
1.0000 | INHALATION_SPRAY | Freq: Two times a day (BID) | RESPIRATORY_TRACT | Status: DC
  Administered 2016-04-01 – 2016-04-04 (×5): 1 via RESPIRATORY_TRACT
  Filled 2016-04-01: qty 8.8

## 2016-04-01 MED ORDER — INSULIN ASPART 100 UNIT/ML ~~LOC~~ SOLN
0.0000 [IU] | Freq: Three times a day (TID) | SUBCUTANEOUS | Status: DC
  Administered 2016-04-02 (×3): 2 [IU] via SUBCUTANEOUS
  Administered 2016-04-03: 3 [IU] via SUBCUTANEOUS
  Administered 2016-04-04: 2 [IU] via SUBCUTANEOUS

## 2016-04-01 MED ORDER — AZITHROMYCIN 500 MG PO TABS
500.0000 mg | ORAL_TABLET | Freq: Every day | ORAL | Status: AC
  Administered 2016-04-01: 500 mg via ORAL
  Filled 2016-04-01: qty 1

## 2016-04-01 NOTE — ED Notes (Signed)
Pt returned from x-ray and placed back on monitor.  

## 2016-04-01 NOTE — H&P (Signed)
History and Physical    Marrio Scribner ZOX:[id number] DOB: [birth year] DOA: 04/01/2016   PCP: [name], MD Chief Complaint:  Chief Complaint  Patient presents with  . Shortness of Breath  . Chest Pain    HPI: Francisco Beard is a 55 y.o. male with medical history significant of COPD, still smokes, recent COPD admission and just discharged from [facility] 11 days ago.  Patient presents to the ED with SOB, chest tightness, wheezing.  Symptoms not improved with albuterol, symptoms are persistent and severe.   ED Course: He has respiratory distress with accessory muscle use initially in the ED, this is improved with neb treatments and solumedrol.   Review of Systems: As per HPI otherwise 10 point review of systems negative.    Past Medical History:  Diagnosis Date  . Asthma   . Bipolar disorder (HCC)   . CHF (congestive heart failure) (HCC)   . Chronic hip pain   . COPD (chronic obstructive pulmonary disease) (HCC)   . Depression   . Hernia   . Hypertension     Past Surgical History:  Procedure Laterality Date  . HAND SURGERY    . shoulder surgery       reports that he has been smoking.  He does not have any smokeless tobacco history on file. He reports that he drinks about 17.2 oz of alcohol per week . He reports that he uses drugs, including Cocaine.  Allergies  Allergen Reactions  . Strawberry Extract Anaphylaxis  . Tomato Anaphylaxis  . Aleve [Naproxen] Other (See Comments)    Heart problems so MD told him to not take  . Aspirin Swelling  . Penicillins Swelling    Has patient had a PCN reaction causing immediate rash, facial/tongue/throat swelling, SOB or lightheadedness with hypotension: Yes Has patient had a PCN reaction causing severe rash involving mucus membranes or skin necrosis: Yes Has patient had a PCN reaction that required hospitalization: Yes Has patient had a PCN reaction occurring within the last 10 years: No If all of the above answers are "NO", then may  proceed with Cephalosporin use.   . Seroquel [Quetiapine] Other (See Comments)    In higher doses, this causes excessive sedation  . Onion Nausea And Vomiting and Rash    No family history on file. Significant for HTN, arthritis, asthma, EtOH abuse, and heart disease.   Prior to Admission medications   Medication Sig Start Date End Date Taking? Authorizing Provider  acetaminophen (TYLENOL) 650 MG CR tablet Take 650 mg by mouth every 8 (eight) hours as needed for pain.   Yes Historical Provider, MD  albuterol (PROVENTIL HFA;VENTOLIN HFA) 108 (90 BASE) MCG/ACT inhaler Inhale 1-2 puffs into the lungs every 4 (four) hours as needed for wheezing or shortness of breath.   Yes Historical Provider, MD  albuterol (PROVENTIL) (2.5 MG/3ML) 0.083% nebulizer solution Take 3 mLs (2.5 mg total) by nebulization every 4 (four) hours as needed for wheezing or shortness of breath. 04/28/13  Yes Susy Frizzle, MD  amantadine (SYMMETREL) 100 MG capsule Take 100 mg by mouth 3 (three) times daily.   Yes Historical Provider, MD  Ascorbic Acid (VITAMIN C PO) Take 1 tablet by mouth daily.   Yes Historical Provider, MD  atorvastatin (LIPITOR) 20 MG tablet Take 20 mg by mouth daily.   Yes Historical Provider, MD  Cholecalciferol (VITAMIN D3) 1000 units CAPS Take 1 capsule by mouth daily.   Yes Historical Provider, MD  diclofenac sodium (VOLTAREN) 1 % GEL Apply  4 g topically 4 (four) times daily. TO RIGHT HIP   Yes Historical Provider, MD  DULoxetine (CYMBALTA) 30 MG capsule Take 30 mg by mouth daily.   Yes Historical Provider, MD  folic acid (FOLVITE) 1 MG tablet Take 1 mg by mouth daily.   Yes Historical Provider, MD  HYDROcodone-acetaminophen (NORCO/VICODIN) 5-325 MG tablet Take 1 tablet by mouth every 6 (six) hours as needed for moderate pain.   Yes Historical Provider, MD  loratadine (CLARITIN) 10 MG tablet Take 10 mg by mouth daily as needed for allergies.   Yes Historical Provider, MD  mometasone-formoterol  (DULERA) 200-5 MCG/ACT AERO Inhale 1 puff into the lungs 2 (two) times daily.   Yes Historical Provider, MD  montelukast (SINGULAIR) 10 MG tablet Take 10 mg by mouth at bedtime.   Yes Historical Provider, MD  pantoprazole (PROTONIX) 40 MG tablet Take 40 mg by mouth 2 (two) times daily.   Yes Historical Provider, MD  sucralfate (CARAFATE) 1 g tablet Take 1 g by mouth 4 (four) times daily -  with meals and at bedtime.   Yes Historical Provider, MD  theophylline (THEO-24) 300 MG 24 hr capsule Take 300 mg by mouth daily.   Yes Historical Provider, MD  tiotropium (SPIRIVA HANDIHALER) 18 MCG inhalation capsule Place 18 mcg into inhaler and inhale daily.   Yes Historical Provider, MD  amLODipine (NORVASC) 5 MG tablet Take 5 mg by mouth daily.    Historical Provider, MD  divalproex (DEPAKOTE) 500 MG DR tablet Take 500-1,000 mg by mouth 2 (two) times daily. Take 1 tablet at supper and 2 tablets at bedtime    Historical Provider, MD  fluPHENAZine (PROLIXIN) 10 MG tablet Take 10 mg by mouth at bedtime.    Historical Provider, MD    Physical Exam: Vitals:   04/01/16 1830 04/01/16 1845 04/01/16 1900 04/01/16 1915  BP: 132/88 130/81 132/91 (!) 143/102  Pulse: (!) 54 60 65 78  Resp: 22 17 21 17   Temp:      TempSrc:      SpO2: 99% 98% 100% 99%  Weight:      Height:          Constitutional: NAD, calm, comfortable Eyes: PERRL, lids and conjunctivae normal ENMT: Mucous membranes are moist. Posterior pharynx clear of any exudate or lesions.Normal dentition.  Neck: normal, supple, no masses, no thyromegaly Respiratory: Diffuse wheezing. Cardiovascular: Regular rate and rhythm, no murmurs / rubs / gallops. No extremity edema. 2+ pedal pulses. No carotid bruits.  Abdomen: no tenderness, no masses palpated. No hepatosplenomegaly. Bowel sounds positive.  Musculoskeletal: no clubbing / cyanosis. No joint deformity upper and lower extremities. Good ROM, no contractures. Normal muscle tone.  Skin: no rashes,  lesions, ulcers. No induration Neurologic: CN 2-12 grossly intact. Sensation intact, DTR normal. Strength 5/5 in all 4.  Psychiatric: Normal judgment and insight. Alert and oriented x 3. Normal mood.    Labs on Admission: I have personally reviewed following labs and imaging studies  CBC:  Recent Labs Lab 04/01/16 1412  WBC 6.0  HGB 14.6  HCT 43.8  MCV 84.4  PLT 255   Basic Metabolic Panel:  Recent Labs Lab 04/01/16 1412  NA 143  K 4.0  CL 113*  CO2 22  GLUCOSE 85  BUN 15  CREATININE 1.08  CALCIUM 9.4   GFR: Estimated Creatinine Clearance: 80.4 mL/min (by C-G formula based on SCr of 1.08 mg/dL). Liver Function Tests: No results for input(s): AST, ALT, ALKPHOS, BILITOT, PROT, ALBUMIN  in the last 168 hours. No results for input(s): LIPASE, AMYLASE in the last 168 hours. No results for input(s): AMMONIA in the last 168 hours. Coagulation Profile: No results for input(s): INR, PROTIME in the last 168 hours. Cardiac Enzymes: No results for input(s): CKTOTAL, CKMB, CKMBINDEX, TROPONINI in the last 168 hours. BNP (last 3 results) No results for input(s): PROBNP in the last 8760 hours. HbA1C: No results for input(s): HGBA1C in the last 72 hours. CBG: No results for input(s): GLUCAP in the last 168 hours. Lipid Profile: No results for input(s): CHOL, HDL, LDLCALC, TRIG, CHOLHDL, LDLDIRECT in the last 72 hours. Thyroid Function Tests: No results for input(s): TSH, T4TOTAL, FREET4, T3FREE, THYROIDAB in the last 72 hours. Anemia Panel: No results for input(s): VITAMINB12, FOLATE, FERRITIN, TIBC, IRON, RETICCTPCT in the last 72 hours. Urine analysis: No results found for: COLORURINE, APPEARANCEUR, LABSPEC, PHURINE, GLUCOSEU, HGBUR, BILIRUBINUR, KETONESUR, PROTEINUR, UROBILINOGEN, NITRITE, LEUKOCYTESUR Sepsis Labs: @LABRCNTIP (procalcitonin:4,lacticidven:4) )No results found for this or any previous visit (from the past 240 hour(s)).   Radiological Exams on Admission: Dg  Chest 2 View  Result Date: 04/01/2016 CLINICAL DATA:  Chest pain, chest congestion and wheezing, 3 days duration. EXAM: CHEST  2 VIEW COMPARISON:  03/18/2016 and multiple previous FINDINGS: Heart size is normal. Mediastinal shadows are normal. There are areas of chronic pulmonary scarring in the perihilar lungs bilaterally. There is emphysematous change at both lung apices. No sign of active infiltrate, mass, effusion or collapse. Chronic degenerative changes affect the spine. IMPRESSION: Findings of chronic lung disease. No sign of active infiltrate, collapse or effusion. Electronically Signed   By: Paulina FusiMark  Shogry M.D.   On: 04/01/2016 14:38   Dg Hip Unilat With Pelvis 2-3 Views Right  Result Date: 04/01/2016 CLINICAL DATA:  Larey SeatFell a week ago onto RIGHT hip, has been walking but with worsening hip pain, history of arthritis EXAM: DG HIP (WITH OR WITHOUT PELVIS) 2-3V RIGHT COMPARISON:  03/21/2015 FINDINGS: Osseous demineralization. Mixed lytic and sclerotic process involving the RIGHT femoral head with significant femoral head deformity. Findings are consistent with avascular necrosis of the RIGHT femoral head with chronic partial collapse of the femoral head. 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 No definite acute fracture, dislocation, or bone destruction. 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 No definite acute bony abnormalities. Electronically Signed   By: Ulyses SouthwardMark  Boles M.D.   On: 04/01/2016 17:57    EKG: Independently reviewed.  Assessment/Plan Principal Problem:   COPD with acute exacerbation (HCC)    1. COPD exacerbation - 1. Adult wheeze protocol for neb treatments 2. Prednisone 3. z pack 4. Needs to quit smoking!   DVT prophylaxis: Lovenox Code Status: Full Family Communication: No family in room Consults called: None Admission status: Admit to  inpatient   Hillary BowGARDNER, JARED M. DO [facility] Pager [phone] from 7PM-7AM  If 7AM-7PM, please contact the day physician for the patient [email or web address] Password [email or web address]  04/01/2016, 7:41 PM

## 2016-04-01 NOTE — ED Triage Notes (Signed)
Patient here with chest pain and increased congestion with wheezing x 3 days. Has CHF and thinks this related, speaking complete sentences but wheezing noted. Alert and oriented, also complains of ongoing left hip pain

## 2016-04-01 NOTE — Significant Event (Signed)
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 1) SSI AC/HS mod scale 2) one time novolog dose of 5 units

## 2016-04-01 NOTE — ED Provider Notes (Signed)
[facility]-EMERGENCY DEPT Provider Note   CSN: [id number] Arrival date & time: 04/01/16  1306     History   Chief Complaint Chief Complaint  Patient presents with  . Shortness of Breath  . Chest Pain    HPI Francisco Beard is a 55 y.o. male.  HPI Patient presents with concern of chest pain, dyspnea. Symptoms began several days ago without clear precipitant. Since onset symptoms of been persistent, with no relief in spite of using his albuterol inhaler. There is chest tightness, but no focal chest pain. No syncope, near syncope, nausea, vomiting. Patient has used his albuterol inhaler, has none left. Patient also notes pain in the right hip following a fall that occurred about 10 days ago. Baseline patient ambulatory with a cane, this is unchanged, he can still bear weight, but the prescription pain throughout the right hip with weightbearing on the right side. Patient notes that he continues to smoke cigarettes, but has reduced his intake from 4 packs daily down to 3 cigarettes daily.    Past Medical History:  Diagnosis Date  . Asthma   . Bipolar disorder (HCC)   . CHF (congestive heart failure) (HCC)   . Chronic hip pain   . COPD (chronic obstructive pulmonary disease) (HCC)   . Depression   . Hernia   . Hypertension     Patient Active Problem List   Diagnosis Date Noted  . Suicidal ideations 08/17/2013    Past Surgical History:  Procedure Laterality Date  . HAND SURGERY    . shoulder surgery         Home Medications    Prior to Admission medications   Medication Sig Start Date End Date Taking? Authorizing Provider  albuterol (PROVENTIL HFA;VENTOLIN HFA) 108 (90 BASE) MCG/ACT inhaler Inhale 1-2 puffs into the lungs every 4 (four) hours as needed for wheezing or shortness of breath.    Historical Provider, MD  albuterol (PROVENTIL) (2.5 MG/3ML) 0.083% nebulizer solution Take 3 mLs (2.5 mg total) by nebulization every 4 (four) hours as needed for wheezing or  shortness of breath. 04/28/13   Susy Frizzle, MD  amLODipine (NORVASC) 5 MG tablet Take 5 mg by mouth daily.    Historical Provider, MD  atorvastatin (LIPITOR) 20 MG tablet Take 20 mg by mouth daily.    Historical Provider, MD  divalproex (DEPAKOTE) 500 MG DR tablet Take 500-1,000 mg by mouth 2 (two) times daily. Take 1 tablet at supper and 2 tablets at bedtime    Historical Provider, MD  DULoxetine (CYMBALTA) 30 MG capsule Take 30 mg by mouth daily.    Historical Provider, MD  fluPHENAZine (PROLIXIN) 10 MG tablet Take 10 mg by mouth at bedtime.    Historical Provider, MD  montelukast (SINGULAIR) 10 MG tablet Take 10 mg by mouth at bedtime.    Historical Provider, MD  omeprazole (PRILOSEC) 40 MG capsule Take 40 mg by mouth daily.    Historical Provider, MD  predniSONE (DELTASONE) 20 MG tablet Take 3 tablets (60 mg total) by mouth daily. 04/28/13   Susy Frizzle, MD  QUEtiapine (SEROQUEL) 100 MG tablet Take 150 mg by mouth at bedtime.    Historical Provider, MD  theophylline (THEO-24) 300 MG 24 hr capsule Take 300 mg by mouth daily.    Historical Provider, MD    Family History No family history on file.  Social History Social History  Substance Use Topics  . Smoking status: Current Every Day Smoker  . Smokeless tobacco: Not on file  .  Alcohol use 17.2 oz/week    12 Cans of beer, 20 Standard drinks or equivalent per week     Allergies   Aleve [naproxen]; Aspirin; Penicillins; Strawberry extract; Tomato; and Onion   Review of Systems Review of Systems  Constitutional:       Per HPI, otherwise negative  HENT:       Per HPI, otherwise negative  Respiratory:       Per HPI, otherwise negative  Cardiovascular:       Per HPI, otherwise negative  Gastrointestinal: Negative for vomiting.  Endocrine:       Negative aside from HPI  Genitourinary:       Neg aside from HPI   Musculoskeletal:       Per HPI, otherwise negative  Skin: Negative.   Neurological: Negative for syncope.      Physical Exam Updated Vital Signs BP 112/70   Pulse (!) 48   Temp 98 F (36.7 C) (Oral)   Resp 17   Ht 5\' 11"  (1.803 m)   Wt 180 lb (81.6 kg)   SpO2 95%   BMI 25.10 kg/m   Physical Exam  Constitutional: He is oriented to person, place, and time. He appears well-developed. No distress.  HENT:  Head: Normocephalic and atraumatic.  Eyes: Conjunctivae and EOM are normal.  Cardiovascular: Normal rate and regular rhythm.   Pulmonary/Chest: Accessory muscle usage present. No stridor. Tachypnea noted. No respiratory distress. He has decreased breath sounds. He has wheezes.  Abdominal: He exhibits no distension.  Musculoskeletal: He exhibits no edema.       Legs: Neurological: He is alert and oriented to person, place, and time.  Skin: Skin is warm and dry.  Psychiatric: He has a normal mood and affect.  Nursing note and vitals reviewed.    ED Treatments / Results  Labs (all labs ordered are listed, but only abnormal results are displayed) Labs Reviewed  BASIC METABOLIC PANEL - Abnormal; Notable for the following:       Result Value   Chloride 113 (*)    All other components within normal limits  CBC  BRAIN NATRIURETIC PEPTIDE  I-STAT TROPOININ, ED    EKG  EKG Interpretation  Date/Time:  Monday April 01 2016 13:24:40 EDT Ventricular Rate:  59 PR Interval:  140 QRS Duration: 68 QT Interval:  392 QTC Calculation: 388 R Axis:   87 Text Interpretation:  Sinus rhythm Artifact ST-t wave abnormality No significant change since last tracing Abnormal ekg Confirmed by Gerhard MunchLOCKWOOD, Alvena Kiernan  MD [id number]) on 04/01/2016 4:23:57 PM       Radiology Dg Chest 2 View  Result Date: 04/01/2016 CLINICAL DATA:  Chest pain, chest congestion and wheezing, 3 days duration. EXAM: CHEST  2 VIEW COMPARISON:  03/18/2016 and multiple previous FINDINGS: Heart size is normal. Mediastinal shadows are normal. There are areas of chronic pulmonary scarring in the perihilar lungs bilaterally. There  is emphysematous change at both lung apices. No sign of active infiltrate, mass, effusion or collapse. Chronic degenerative changes affect the spine. IMPRESSION: Findings of chronic lung disease. No sign of active infiltrate, collapse or effusion. Electronically Signed   By: Paulina FusiMark  Shogry M.D.   On: 04/01/2016 14:38    Procedures Procedures (including critical care time)  Medications Ordered in ED Medications  albuterol (PROVENTIL) (2.5 MG/3ML) 0.083% nebulizer solution 5 mg (5 mg Nebulization Given 04/01/16 1700)  methylPREDNISolone sodium succinate (SOLU-MEDROL) 125 mg/2 mL injection 125 mg (125 mg Intravenous Given 04/01/16 1710)  Initial Impression / Assessment and Plan / ED Course  I have reviewed the triage vital signs and the nursing notes.  Pertinent labs & imaging results that were available during my care of the patient were reviewed by me and considered in my medical decision making (see chart for details).  Clinical Course  Comment By Time  Following initial neb, the patient continues to have wheezing, coarse lung sounds Gerhard Munchobert Elvert Cumpton, MD 10/30 1810    Following continues neb the patient continues to have wheezing.   Final Clinical Impressions(s) / ED Diagnoses  Multiple medical issues presents with chest pain, dyspnea. Patient does have CHF, as well as COPD. Here, evaluation somewhat reassuring, no evidence for comfort heart failure. However, patient does have persistent increased work of breathing, large multiple albuterol sessions, steroids, with moderate improvement in his respiratory status. Given concern for COPD exacerbation, persistent increased work of breathing, the patient was admitted for further evaluation and management.   Gerhard Munchobert Kasey Ewings, MD 04/01/16 [phone]

## 2016-04-01 NOTE — ED Notes (Signed)
Pt transported to xray 

## 2016-04-01 NOTE — ED Notes (Signed)
Pt c/o SOB and sounds congested when speaking

## 2016-04-02 ENCOUNTER — Encounter (HOSPITAL_COMMUNITY): Payer: Self-pay | Admitting: *Deleted

## 2016-04-02 DIAGNOSIS — J441 Chronic obstructive pulmonary disease with (acute) exacerbation: Principal | ICD-10-CM

## 2016-04-02 DIAGNOSIS — M87051 Idiopathic aseptic necrosis of right femur: Secondary | ICD-10-CM

## 2016-04-02 DIAGNOSIS — K219 Gastro-esophageal reflux disease without esophagitis: Secondary | ICD-10-CM

## 2016-04-02 LAB — GLUCOSE, CAPILLARY
GLUCOSE-CAPILLARY: 127 mg/dL — AB (ref 65–99)
GLUCOSE-CAPILLARY: 137 mg/dL — AB (ref 65–99)
GLUCOSE-CAPILLARY: 170 mg/dL — AB (ref 65–99)
Glucose-Capillary: 136 mg/dL — ABNORMAL HIGH (ref 65–99)

## 2016-04-02 MED ORDER — ALUM & MAG HYDROXIDE-SIMETH 200-200-20 MG/5ML PO SUSP
30.0000 mL | Freq: Four times a day (QID) | ORAL | Status: DC | PRN
  Administered 2016-04-02: 30 mL via ORAL
  Filled 2016-04-02: qty 30

## 2016-04-02 MED ORDER — THEOPHYLLINE ER 300 MG PO CP24
300.0000 mg | ORAL_CAPSULE | Freq: Every day | ORAL | Status: DC
  Filled 2016-04-02 (×2): qty 1

## 2016-04-02 NOTE — Progress Notes (Signed)
PROGRESS NOTE    Leanord AsalJohn Perkins  ZOX:[id number] DOB: August 24, 1960 DOA: 04/01/2016 PCP: [name], [name], MD  Brief Narrative: Leanord AsalJohn Ridge is a 55 y.o. male with medical history significant of COPD, still smokes, recent COPD admission and just discharged from [facility] 11 days ago.  Patient presents to the ED with SOB, chest tightness, wheezing.  Symptoms not improved with albuterol, symptoms are persistent and severe. Diagnosed with COPD Exacerbation and found to have AVN of Right Hip so Orthopedics was consulted.   Assessment & Plan:   Acute Exacerbation of COPD -C/w Albuterol Nebs q4h and q4hprn for Wheezing SOB -C/w Montelukast 10 mg daily, Prednisone 50 mg daily, and Duleara 1 puff BID -C/w Theophylline and Spiriva -Given Continuous Neb yesterday -C/w Zithromax 500 mg po -CXR showed Findings of chronic lung disease. No sign of active infiltrate, collapse or effusion. -Smoking Cessation Counseling given  Avascular Necrosis of Right Hip/Arthritis -Ortho Consulted; Likely Outpatient procedure per Dr. Luiz BlareGraves -Pain control with Norco, Diclofenac Gel,  Bipolar Disorder/Depression/Schizoaffective Disorder -C/w Duloxetine -Patient noncompliant with Depakote or Fluphenazine  -  GERD -C/w Pantoprazole 40 mg po BID and with Sucrlafate 1 g TID with Meals and Bedtime -C/w Maalox/Mylanta  CHF  -Per Records Diagnosed 06/08/2015 -Clinically Euvolemic -No ECHO Done at this hospital   HTN -Patient not taking His home Amlodipine at home -Hydralazine prn for elevated BP  HLD -C/w Atorvastatin  DVT prophylaxis: Lovenox Code Status: FULL Family Communication: No Family at Bedside Disposition Plan: Pending PT Eval  Consultants:   Orthopedics  Procedures: None  Antimicrobials: Zithromax  Subjective: Seen and examined at bedside and stated his Right hip hurt tremendously. Breathing was somewhat better.   Objective: Vitals:   04/02/16 0742 04/02/16 1135 04/02/16 1408 04/02/16 1544  BP:    112/61   Pulse:   62   Resp:   16   Temp:   98 F (36.7 C)   TempSrc:   Oral   SpO2: 98% 99% 97% 98%  Weight:      Height:        Intake/Output Summary (Last 24 hours) at 04/02/16 2010 Last data filed at 04/02/16 [id number]  Gross per 24 hour  Intake              170 ml  Output                0 ml  Net              170 ml   Filed Weights   04/01/16 1326  Weight: 81.6 kg (180 lb)    Examination: Physical Exam:  Constitutional: WN/WD, NAD and appears calm and comfortable laying in bed Eyes: Lids and conjunctivae normal, sclerae anicteric  ENMT: External Ears, Nose appear normal. Grossly normal hearing.  Neck: Appears normal, supple, no cervical masses, normal ROM, no appreciable thyromegaly Respiratory: Diminished bilaterally with mild wheezing, no rales, rhonchi or crackles. Normal respiratory effort and patient is not tachypenic. No accessory muscle use.  Cardiovascular: RRR, no murmurs / rubs / gallops. S1 and S2 auscultated. No extremity edema.  Abdomen: Soft, non-tender, non-distended. No masses palpated. No appreciable hepatosplenomegaly. Bowel sounds positive.  GU: Deferred. Musculoskeletal: No clubbing / cyanosis of digits/nails. No joint deformity upper and lower extremities. Decreased ROM in Right Hip; Significant pain on passive motion.  Skin: No rashes, lesions, ulcers. No induration; Warm and dry.  Neurologic: CN 2-12 grossly intact with no focal deficits. Sensation intact in all 4 Extremities. Romberg sign cerebellar reflexes  not assessed.  Psychiatric: Normal judgment and insight. Alert and oriented x 3. Anxious mood and flat affect.   Data Reviewed: I have personally reviewed following labs and imaging studies  CBC:  Recent Labs Lab 04/01/16 1412  WBC 6.0  HGB 14.6  HCT 43.8  MCV 84.4  PLT 255   Basic Metabolic Panel:  Recent Labs Lab 04/01/16 1412  NA 143  K 4.0  CL 113*  CO2 22  GLUCOSE 85  BUN 15  CREATININE 1.08  CALCIUM 9.4    GFR: Estimated Creatinine Clearance: 80.4 mL/min (by C-G formula based on SCr of 1.08 mg/dL). Liver Function Tests: No results for input(s): AST, ALT, ALKPHOS, BILITOT, PROT, ALBUMIN in the last 168 hours. No results for input(s): LIPASE, AMYLASE in the last 168 hours. No results for input(s): AMMONIA in the last 168 hours. Coagulation Profile: No results for input(s): INR, PROTIME in the last 168 hours. Cardiac Enzymes: No results for input(s): CKTOTAL, CKMB, CKMBINDEX, TROPONINI in the last 168 hours. BNP (last 3 results) No results for input(s): PROBNP in the last 8760 hours. HbA1C: No results for input(s): HGBA1C in the last 72 hours. CBG:  Recent Labs Lab 04/01/16 2332 04/02/16 0740 04/02/16 1212 04/02/16 1712  GLUCAP 321* 136* 127* 137*   Lipid Profile: No results for input(s): CHOL, HDL, LDLCALC, TRIG, CHOLHDL, LDLDIRECT in the last 72 hours. Thyroid Function Tests: No results for input(s): TSH, T4TOTAL, FREET4, T3FREE, THYROIDAB in the last 72 hours. Anemia Panel: No results for input(s): VITAMINB12, FOLATE, FERRITIN, TIBC, IRON, RETICCTPCT in the last 72 hours. Sepsis Labs: No results for input(s): PROCALCITON, LATICACIDVEN in the last 168 hours.  No results found for this or any previous visit (from the past 240 hour(s)).   Radiology Studies: Dg Chest 2 View  Result Date: 04/01/2016 CLINICAL DATA:  Chest pain, chest congestion and wheezing, 3 days duration. EXAM: CHEST  2 VIEW COMPARISON:  03/18/2016 and multiple previous FINDINGS: Heart size is normal. Mediastinal shadows are normal. There are areas of chronic pulmonary scarring in the perihilar lungs bilaterally. There is emphysematous change at both lung apices. No sign of active infiltrate, mass, effusion or collapse. Chronic degenerative changes affect the spine. IMPRESSION: Findings of chronic lung disease. No sign of active infiltrate, collapse or effusion. Electronically Signed   By: Paulina Fusi M.D.    On: 04/01/2016 14:38   Dg Hip Unilat With Pelvis 2-3 Views Right  Result Date: 04/01/2016 CLINICAL DATA:  Larey Seat a week ago onto RIGHT hip, has been walking but with worsening hip pain, history of arthritis EXAM: DG HIP (WITH OR WITHOUT PELVIS) 2-3V RIGHT COMPARISON:  03/21/2015 FINDINGS: Osseous demineralization. Mixed lytic and sclerotic process involving the RIGHT femoral head with significant femoral head deformity. Findings are consistent with avascular necrosis of the RIGHT femoral head with chronic partial collapse of the femoral head. 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 No definite acute fracture, dislocation, or bone destruction. 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 No definite acute bony abnormalities. Electronically Signed   By: Ulyses Southward M.D.   On: 04/01/2016 17:57   Scheduled Meds: . acetaminophen  650 mg Oral Q8H  . amantadine  100 mg Oral TID  . atorvastatin  20 mg Oral q1800  . azithromycin  250 mg Oral Q24H  . diclofenac sodium  4 g Topical QID  . DULoxetine  30 mg Oral  Daily  . enoxaparin (LOVENOX) injection  40 mg Subcutaneous Q24H  . folic acid  1 mg Oral Daily  . insulin aspart  0-15 Units Subcutaneous TID WC  . mometasone-formoterol  1 puff Inhalation BID  . montelukast  10 mg Oral QHS  . pantoprazole  40 mg Oral BID  . predniSONE  50 mg Oral Q breakfast  . sucralfate  1 g Oral TID WC & HS  . theophylline  300 mg Oral QHS  . tiotropium  18 mcg Inhalation Daily   Continuous Infusions: . albuterol Stopped (04/01/16 2008)     LOS: 1 day   Merlene Laughtermair Latif Sheikh, DO [facility] Pager [phone]  If 7PM-7AM, please contact night-coverage [email or web address] Password [email or web address] 04/02/2016, 8:10 PM

## 2016-04-02 NOTE — Care Management Note (Signed)
Case Management Note  Patient Details  Name: Francisco Beard MRN: [id number] Date of Birth: [birth year]  Subjective/Objective:                 Patient admitted from home with COPD exacerbation. Patient is indpendent for ADLs. No DME needed.   PCP [facility]. Medicaid for meds.    Action/Plan:  CM will continue to follow as needed, no needs identified at this time.   Expected Discharge Date:                  Expected Discharge Plan:  Home/Self Care  In-House Referral:  NA  Discharge planning Services  CM Consult  Post Acute Care Choice:    Choice offered to:     DME Arranged:    DME Agency:     HH Arranged:    HH Agency:     Status of Service:  Completed, signed off  If discussed at [employer] Length of Stay Meetings, dates discussed:    Additional Comments:  Francisco SabalDebbie Annagrace Carr, RN 04/02/2016, 2:47 PM

## 2016-04-02 NOTE — Consult Note (Signed)
Reason for Consult: Severe right hip pain Referring Physician: Hospitalists  Francisco Beard is an 55 y.o. male.  HPI: The patient is a 55 year old male with known long history of right hip problems. He has been worsening over the last several years. He is currently admitted with an exacerbation of COPD. We are consult in for evaluation and management of right hip pain.  Past Medical History:  Diagnosis Date  . Asthma   . Bipolar disorder ([place])   . CHF (congestive heart failure) ([place])   . Chronic hip pain   . COPD (chronic obstructive pulmonary disease) ([place])   . Depression   . Hernia   . Hypertension     Past Surgical History:  Procedure Laterality Date  . HAND SURGERY    . shoulder surgery      History reviewed. No pertinent family history.  Social History:  reports that he has been smoking.  He has never used smokeless tobacco. He reports that he drinks about 17.2 oz of alcohol per week . He reports that he uses drugs, including Cocaine.  Allergies:  Allergies  Allergen Reactions  . Strawberry Extract Anaphylaxis  . Tomato Anaphylaxis  . Aleve [Naproxen] Other (See Comments)    Heart problems so MD told him to not take  . Aspirin Swelling  . Penicillins Swelling    Has patient had a PCN reaction causing immediate rash, facial/tongue/throat swelling, SOB or lightheadedness with hypotension: Yes Has patient had a PCN reaction causing severe rash involving mucus membranes or skin necrosis: Yes Has patient had a PCN reaction that required hospitalization: Yes Has patient had a PCN reaction occurring within the last 10 years: No If all of the above answers are "NO", then may proceed with Cephalosporin use.   . Seroquel [Quetiapine] Other (See Comments)    In higher doses, this causes excessive sedation  . Onion Nausea And Vomiting and Rash    Medications: I have reviewed the patient's current medications.  Results for orders placed or performed during the hospital  encounter of 04/01/16 (from the past 48 hour(s))  Basic metabolic panel     Status: Abnormal   Collection Time: 04/01/16  2:12 PM  Result Value Ref Range   Sodium 143 135 - 145 mmol/L   Potassium 4.0 3.5 - 5.1 mmol/L   Chloride 113 (H) 101 - 111 mmol/L   CO2 22 22 - 32 mmol/L   Glucose, Bld 85 65 - 99 mg/dL   BUN 15 6 - 20 mg/dL   Creatinine, Ser 1.08 0.61 - 1.24 mg/dL   Calcium 9.4 8.9 - 10.3 mg/dL   GFR calc non Af Amer >60 >60 mL/min   GFR calc Af Amer >60 >60 mL/min    Comment: (NOTE) The eGFR has been calculated using the CKD EPI equation. This calculation has not been validated in all clinical situations. eGFR's persistently <60 mL/min signify possible Chronic Kidney Disease.    Anion gap 8 5 - 15  CBC     Status: None   Collection Time: 04/01/16  2:12 PM  Result Value Ref Range   WBC 6.0 4.0 - 10.5 K/uL   RBC 5.19 4.22 - 5.81 MIL/uL   Hemoglobin 14.6 13.0 - 17.0 g/dL   HCT 43.8 39.0 - 52.0 %   MCV 84.4 78.0 - 100.0 fL   MCH 28.1 26.0 - 34.0 pg   MCHC 33.3 30.0 - 36.0 g/dL   RDW 15.0 11.5 - 15.5 %   Platelets 255 150 -  400 K/uL  Brain natriuretic peptide     Status: None   Collection Time: 04/01/16  2:12 PM  Result Value Ref Range   B Natriuretic Peptide 43.2 0.0 - 100.0 pg/mL  I-stat troponin, ED     Status: None   Collection Time: 04/01/16  2:21 PM  Result Value Ref Range   Troponin i, poc 0.00 0.00 - 0.08 ng/mL   Comment 3            Comment: Due to the release kinetics of cTnI, a negative result within the first hours of the onset of symptoms does not rule out myocardial infarction with certainty. If myocardial infarction is still suspected, repeat the test at appropriate intervals.   Glucose, capillary     Status: Abnormal   Collection Time: 04/01/16 11:32 PM  Result Value Ref Range   Glucose-Capillary 321 (H) 65 - 99 mg/dL  Glucose, capillary     Status: Abnormal   Collection Time: 04/02/16  7:40 AM  Result Value Ref Range   Glucose-Capillary 136  (H) 65 - 99 mg/dL  Glucose, capillary     Status: Abnormal   Collection Time: 04/02/16 12:12 PM  Result Value Ref Range   Glucose-Capillary 127 (H) 65 - 99 mg/dL    Dg Chest 2 View  Result Date: 04/01/2016 CLINICAL DATA:  Chest pain, chest congestion and wheezing, 3 days duration. EXAM: CHEST  2 VIEW COMPARISON:  03/18/2016 and multiple previous FINDINGS: Heart size is normal. Mediastinal shadows are normal. There are areas of chronic pulmonary scarring in the perihilar lungs bilaterally. There is emphysematous change at both lung apices. No sign of active infiltrate, mass, effusion or collapse. Chronic degenerative changes affect the spine. IMPRESSION: Findings of chronic lung disease. No sign of active infiltrate, collapse or effusion. Electronically Signed   By: Nelson Chimes M.D.   On: 04/01/2016 14:38   Dg Hip Unilat With Pelvis 2-3 Views Right  Result Date: 04/01/2016 CLINICAL DATA:  [place] a week ago onto RIGHT hip, has been walking but with worsening hip pain, history of arthritis EXAM: DG HIP (WITH OR WITHOUT PELVIS) 2-3V RIGHT COMPARISON:  03/21/2015 FINDINGS: Osseous demineralization. Mixed lytic and sclerotic process involving the RIGHT femoral head with significant femoral head deformity. Findings are consistent with avascular necrosis of the RIGHT femoral head with chronic partial collapse of the femoral head. 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 No definite acute fracture, dislocation, or bone destruction. 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 No definite acute bony abnormalities. Electronically Signed   By: Lavonia Dana M.D.   On: 04/01/2016 17:57    ROS  ROS: I have reviewed the patient's review of systems thoroughly and there are no positive responses as relates to the HPI. EXAM: Blood pressure 107/66, pulse (!) 59, temperature 97.8 F (36.6  C), temperature source Oral, resp. rate 18, height 5' 11"  (1.803 m), weight 81.6 kg (180 lb), SpO2 99 %. Physical Exam Well-developed well-nourished patient in no acute distress. Alert and oriented x3 HEENT:within normal limits Cardiac: Regular rate and rhythm Pulmonary: Lungs clear to auscultation Abdomen: Soft and nontender.  Normal active bowel sounds  Musculoskeletal: (Right hip: Pain with range of motion. Limited internal rotation. Neurovascularly intact distally. Assessment/Plan: 55 year old male with long history of right hip pain with known COPD.//The patient has avascular necrosis of the right hip with collapse. He will need a right  total hip replacement. Given the patient's complex medical history and issues of social problems I think he needs to come to my office and have a clear line of sight of where he is going post surgery and how he is going to be taking care of. I think he needs medical, pulmonary, and cardiology clearance. Once he has these things I think we can sit down with him and discuss anterior total hip replacement on the right side. He will obviously do well with this procedure. He had talked a little bit about wanting to get it done on this admission and I just feel that the data shows that he will be better having this done as an elective situation with clear line of sight of where he is going to do post surgery and who is going to be taking care of him. If there needs to be further discussion about trying to do this on this admission basal hesitate to contact me but he would obviously need full clearance from pulmonary and cardiology before we consider this.  Gracyn Santillanes,Cobe L 04/02/2016, 1:39 PM   Cell [phone]

## 2016-04-03 LAB — GLUCOSE, CAPILLARY
GLUCOSE-CAPILLARY: 86 mg/dL (ref 65–99)
GLUCOSE-CAPILLARY: 123 mg/dL — AB (ref 65–99)
Glucose-Capillary: 120 mg/dL — ABNORMAL HIGH (ref 65–99)
Glucose-Capillary: 161 mg/dL — ABNORMAL HIGH (ref 65–99)

## 2016-04-03 MED ORDER — THEOPHYLLINE ER 100 MG PO TB12
100.0000 mg | ORAL_TABLET | Freq: Three times a day (TID) | ORAL | Status: DC
  Administered 2016-04-03 – 2016-04-04 (×3): 100 mg via ORAL
  Filled 2016-04-03 (×4): qty 1

## 2016-04-03 NOTE — Progress Notes (Signed)
PROGRESS NOTE    Francisco Beard  [id number] DOB: July 12, 1960 DOA: 04/01/2016 PCP: [name], [name], MD   Brief Narrative: Francisco Beard is a 55 y.o. with medical history significant of COPD. Patient found to have a COPD exacerbation.   Assessment & Plan:   Principal Problem:   COPD with acute exacerbation (HCC)  COPD exacerbation Improved but still wheezing. -continue DuoNeb -continue azithromycin -continue theophylline -continue prednisone -continue Dulera -discussed smoking cessataion  Right leg pain Patient with a history of avascular necrosis of right femoral head with chronic partial femoral head collapse. Orthopedic surgery recommends outpatient follow-up. -pain management -pt eval   DVT prophylaxis: Lovenox Code Status: Full code Family Communication: None at bedside Disposition Plan: Likely discharge home tomorrow   Consultants:   Orthopedic surgery  Procedures:   None  Antimicrobials:   Azithromycin (10/31>>    Subjective: Patient reports right leg pain and some wheezing. Feels better  Objective: Vitals:   04/02/16 1544 04/02/16 2152 04/03/16 0502 04/03/16 1500  BP:  (!) 104/52 122/68 117/66  Pulse:  63 (!) 59 67  Resp:  20 20   Temp:  98.1 F (36.7 C) 98.5 F (36.9 C)   TempSrc:  Oral    SpO2: 98% 96% 98% 98%  Weight:   74.2 kg (163 lb 8 oz)   Height:        Intake/Output Summary (Last 24 hours) at 04/03/16 1632 Last data filed at 04/03/16 0950  Gross per 24 hour  Intake              360 ml  Output                0 ml  Net              360 ml   Filed Weights   04/01/16 1326 04/03/16 0502  Weight: 81.6 kg (180 lb) 74.2 kg (163 lb 8 oz)    Examination:  General exam: Appears calm and comfortable. Lying on left side Respiratory system: Diminished breath sounds with bilateral wheezing. Respiratory effort normal. Cardiovascular system: S1 & S2 heard, RRR. No murmurs, rubs, gallops or clicks. Gastrointestinal system: Abdomen is  nondistended, soft and nontender. Normal bowel sounds heard. Central nervous system: Alert and oriented. No focal neurological deficits. Extremities: No edema. No calf tenderness. Right leg tenderness Skin: No cyanosis. No rashes Psychiatry: Judgement and insight appear normal. Mood & affect appropriate.     Data Reviewed: I have personally reviewed following labs and imaging studies  CBC:  Recent Labs Lab 04/01/16 1412  WBC 6.0  HGB 14.6  HCT 43.8  MCV 84.4  PLT 255   Basic Metabolic Panel:  Recent Labs Lab 04/01/16 1412  NA 143  K 4.0  CL 113*  CO2 22  GLUCOSE 85  BUN 15  CREATININE 1.08  CALCIUM 9.4   GFR: Estimated Creatinine Clearance: 79.2 mL/min (by C-G formula based on SCr of 1.08 mg/dL). Liver Function Tests: No results for input(s): AST, ALT, ALKPHOS, BILITOT, PROT, ALBUMIN in the last 168 hours. No results for input(s): LIPASE, AMYLASE in the last 168 hours. No results for input(s): AMMONIA in the last 168 hours. Coagulation Profile: No results for input(s): INR, PROTIME in the last 168 hours. Cardiac Enzymes: No results for input(s): CKTOTAL, CKMB, CKMBINDEX, TROPONINI in the last 168 hours. BNP (last 3 results) No results for input(s): PROBNP in the last 8760 hours. HbA1C: No results for input(s): HGBA1C in the last 72 hours. CBG:  Recent  Labs Lab 04/02/16 1212 04/02/16 1712 04/02/16 2154 04/03/16 0752 04/03/16 1200  GLUCAP 127* 137* 170* 86 120*   Lipid Profile: No results for input(s): CHOL, HDL, LDLCALC, TRIG, CHOLHDL, LDLDIRECT in the last 72 hours. Thyroid Function Tests: No results for input(s): TSH, T4TOTAL, FREET4, T3FREE, THYROIDAB in the last 72 hours. Anemia Panel: No results for input(s): VITAMINB12, FOLATE, FERRITIN, TIBC, IRON, RETICCTPCT in the last 72 hours. Sepsis Labs: No results for input(s): PROCALCITON, LATICACIDVEN in the last 168 hours.  No results found for this or any previous visit (from the past 240 hour(s)).      Radiology Studies: Dg Hip Unilat With Pelvis 2-3 Views Right  Result Date: 04/01/2016 CLINICAL DATA:  Larey SeatFell a week ago onto RIGHT hip, has been walking but with worsening hip pain, history of arthritis EXAM: DG HIP (WITH OR WITHOUT PELVIS) 2-3V RIGHT COMPARISON:  03/21/2015 FINDINGS: Osseous demineralization. Mixed lytic and sclerotic process involving the RIGHT femoral head with significant femoral head deformity. Findings are consistent with avascular necrosis of the RIGHT femoral head with chronic partial collapse of the femoral head. 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 No definite acute fracture, dislocation, or bone destruction. 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 No definite acute bony abnormalities. Electronically Signed   By: Ulyses SouthwardMark  Boles M.D.   On: 04/01/2016 17:57     Scheduled Meds: . acetaminophen  650 mg Oral Q8H  . atorvastatin  20 mg Oral q1800  . azithromycin  250 mg Oral Q24H  . diclofenac sodium  4 g Topical QID  . DULoxetine  30 mg Oral Daily  . enoxaparin (LOVENOX) injection  40 mg Subcutaneous Q24H  . folic acid  1 mg Oral Daily  . insulin aspart  0-15 Units Subcutaneous TID WC  . mometasone-formoterol  1 puff Inhalation BID  . montelukast  10 mg Oral QHS  . pantoprazole  40 mg Oral BID  . predniSONE  50 mg Oral Q breakfast  . sucralfate  1 g Oral TID WC & HS  . theophylline  100 mg Oral Q8H  . tiotropium  18 mcg Inhalation Daily   Continuous Infusions: . albuterol Stopped (04/01/16 2008)     LOS: 2 days     Jacquelin Hawkingalph Cecilio Ohlrich [facility] 04/03/2016, 4:32 PM Pager: [phone]  If 7PM-7AM, please contact night-coverage [email or web address] Password [email or web address] 04/03/2016, 4:32 PM

## 2016-04-03 NOTE — Progress Notes (Signed)
Subjective: The patient complains of right hip pain which she has had chronically. He reports hip pain since 2009. He ambulates with a cane.  Objective: Vital signs in last 24 hours: Temp:  [98.1 F (36.7 C)-98.5 F (36.9 C)] 98.5 F (36.9 C) (11/01 0502) Pulse Rate:  [59-67] 67 (11/01 1500) Resp:  [20] 20 (11/01 0502) BP: (104-122)/(52-68) 117/66 (11/01 1500) SpO2:  [96 %-98 %] 98 % (11/01 1500) Weight:  [74.2 kg (163 lb 8 oz)] 74.2 kg (163 lb 8 oz) (11/01 0502)  Intake/Output from previous day: No intake/output data recorded. Intake/Output this shift: Total I/O In: 360 [P.O.:360] Out: -    Recent Labs  04/01/16 1412  HGB 14.6    Recent Labs  04/01/16 1412  WBC 6.0  RBC 5.19  HCT 43.8  PLT 255    Recent Labs  04/01/16 1412  NA 143  K 4.0  CL 113*  CO2 22  BUN 15  CREATININE 1.08  GLUCOSE 85  CALCIUM 9.4   No results for input(s): LABPT, INR in the last 72 hours. Right hip exam: Patient appears comfortable in bed. He has pain with range of motion of his right hip.   Assessment/Plan: Chronic avascular necrosis right hip with chronic partial femoral head collapse. Plan: May weight-bear as tolerated either using a cane or a walker. The patient will need follow-up with Dr. Luiz BlareGraves on an outpatient basis in the office in 1-2 weeks. I asked the patient to call our office to make an appointment.   Seniya Stoffers G 04/03/2016, 6:11 PM

## 2016-04-04 LAB — GLUCOSE, CAPILLARY
GLUCOSE-CAPILLARY: 108 mg/dL — AB (ref 65–99)
Glucose-Capillary: 143 mg/dL — ABNORMAL HIGH (ref 65–99)

## 2016-04-04 MED ORDER — PREDNISONE 50 MG PO TABS: 50.0000 mg | ORAL_TABLET | Freq: Every day | ORAL | 0 refills | Status: AC

## 2016-04-04 MED ORDER — AMLODIPINE BESYLATE 5 MG PO TABS: 5.0000 mg | ORAL_TABLET | Freq: Every day | ORAL | 0 refills | Status: DC

## 2016-04-04 MED ORDER — TIOTROPIUM BROMIDE MONOHYDRATE 18 MCG IN CAPS: 18.0000 ug | ORAL_CAPSULE | Freq: Every day | RESPIRATORY_TRACT | 0 refills | Status: DC

## 2016-04-04 MED ORDER — HYDROCODONE-ACETAMINOPHEN 5-325 MG PO TABS: 1.0000 | ORAL_TABLET | Freq: Four times a day (QID) | ORAL | 0 refills | Status: DC | PRN

## 2016-04-04 MED ORDER — AZITHROMYCIN 250 MG PO TABS: 250.0000 mg | ORAL_TABLET | ORAL | 0 refills | Status: AC

## 2016-04-04 MED ORDER — MOMETASONE FURO-FORMOTEROL FUM 200-5 MCG/ACT IN AERO: 1.0000 | INHALATION_SPRAY | Freq: Two times a day (BID) | RESPIRATORY_TRACT | 0 refills | Status: DC

## 2016-04-04 NOTE — Evaluation (Signed)
Physical Therapy Evaluation Patient Details Name: Francisco AsalJohn Beard MRN: [id number] DOB: May 25, 1961 Today's Date: 04/04/2016   History of Present Illness  Patient is a 55 yo male admitted 04/01/16 with chest pain and SOB.  Patient with COPD exacerbation, and has chronic Rt hip pain.   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  Good balance with gait.  No PT needs identified.  Patient to d/c home today.    Follow Up Recommendations No PT follow up;Supervision - Intermittent    Equipment Recommendations  None recommended by PT    Recommendations for Other Services       Precautions / Restrictions Precautions Precautions: None Restrictions Weight Bearing Restrictions: No      Mobility  Bed Mobility Overal bed mobility: Independent                Transfers Overall transfer level: Independent Equipment used: None                Ambulation/Gait Ambulation/Gait assistance: Modified independent (Device/Increase time) Ambulation Distance (Feet): 86 Feet Assistive device: None Gait Pattern/deviations: Step-through pattern;Decreased stance time - right;Decreased step length - left;Decreased step length - right;Decreased stride length;Decreased weight shift to right;Antalgic Gait velocity: decreased Gait velocity interpretation: Below normal speed for age/gender General Gait Details: Patient with slow, antalgic gait.  Good balance during gait.  Stairs            Wheelchair Mobility    Modified Rankin (Stroke Patients Only)       Balance Overall balance assessment: No apparent balance deficits (not formally assessed)                                           Pertinent Vitals/Pain Pain Assessment: Faces Faces Pain Scale: Hurts even more Pain Location: Rt  hip Pain Descriptors / Indicators: Aching;Grimacing Pain Intervention(s): Limited activity within patient's tolerance;Monitored during session;Repositioned    Home Living Family/patient expects to be discharged to:: Private residence Living Arrangements: Alone Available Help at Discharge: Friend(s);Available PRN/intermittently;Other (Comment) (HH "nursing") Type of Home: Apartment         Home Equipment: Cane - single point      Prior Function Level of Independence: Independent with assistive device(s);Needs assistance   Gait / Transfers Assistance Needed: Ambulates with cane  ADL's / Homemaking Assistance Needed: "HH Nursing" helps patient with bathing per patient.        Hand Dominance        Extremity/Trunk Assessment   Upper Extremity Assessment: Overall WFL for tasks assessed           Lower Extremity Assessment: RLE deficits/detail RLE Deficits / Details: Decreased ROM and strength due to hip pain    Cervical / Trunk Assessment: Normal  Communication   Communication: No difficulties  Cognition Arousal/Alertness: Awake/alert Behavior During Therapy: WFL for tasks assessed/performed;Anxious Overall Cognitive Status: Within Functional Limits for tasks assessed                      General Comments      Exercises     Assessment/Plan    PT Assessment Patent does not need any further PT services  PT Problem List  PT Treatment Interventions      PT Goals (Current goals can be found in the Care Plan section)  Acute Rehab PT Goals PT Goal Formulation: All assessment and education complete, DC therapy    Frequency     Barriers to discharge        Co-evaluation               End of Session   Activity Tolerance: Patient tolerated treatment well;Patient limited by pain Patient left: in bed;with call bell/phone within reach (sitting EOB) Nurse Communication: Mobility status         Time: [id number] PT Time  Calculation (min) (ACUTE ONLY): 9 min   Charges:   PT Evaluation $PT Eval Low Complexity: 1 Procedure     PT G CodesVena Austria:        [name] 04/04/2016, 2:16 PM Durenda HurtSusan H. [name], PT, [facility] Acute [facility]hab [facility] Pager [phone]

## 2016-04-04 NOTE — Discharge Instructions (Signed)
Francisco Beard, you were admitted because of your COPD exacerbation. You were treated with breathing treatments, steroids and an antibiotic. You have improved significantly. You were also found to have some breakdown of one of the bones of your leg. Orthopedic surgery will see you as an outpatient to have this surgery done.

## 2016-04-04 NOTE — Progress Notes (Signed)
Nsg Discharge Note  Admit Date:  04/01/2016 Discharge date: 04/04/2016   Francisco Beard to be D/C'd Home per MD order.  AVS completed.  Copy for chart, and copy for patient signed, and dated. Patient/caregiver able to verbalize understanding.  Discharge Medication:   Medication List    STOP taking these medications   amantadine 100 MG capsule Commonly known as:  SYMMETREL   divalproex 500 MG DR tablet Commonly known as:  DEPAKOTE   fluPHENAZine 10 MG tablet Commonly known as:  PROLIXIN     TAKE these medications   acetaminophen 650 MG CR tablet Commonly known as:  TYLENOL Take 650 mg by mouth every 8 (eight) hours as needed for pain.   albuterol 108 (90 Base) MCG/ACT inhaler Commonly known as:  PROVENTIL HFA;VENTOLIN HFA Inhale 1-2 puffs into the lungs every 4 (four) hours as needed for wheezing or shortness of breath.   albuterol (2.5 MG/3ML) 0.083% nebulizer solution Commonly known as:  PROVENTIL Take 3 mLs (2.5 mg total) by nebulization every 4 (four) hours as needed for wheezing or shortness of breath.   amLODipine 5 MG tablet Commonly known as:  NORVASC Take 1 tablet (5 mg total) by mouth daily.   atorvastatin 20 MG tablet Commonly known as:  LIPITOR Take 20 mg by mouth daily.   azithromycin 250 MG tablet Commonly known as:  ZITHROMAX Take 1 tablet (250 mg total) by mouth daily.   DULoxetine 30 MG capsule Commonly known as:  CYMBALTA Take 30 mg by mouth daily.   folic acid 1 MG tablet Commonly known as:  FOLVITE Take 1 mg by mouth daily.   HYDROcodone-acetaminophen 5-325 MG tablet Commonly known as:  NORCO/VICODIN Take 1 tablet by mouth every 6 (six) hours as needed for moderate pain.   loratadine 10 MG tablet Commonly known as:  CLARITIN Take 10 mg by mouth daily as needed for allergies.   mometasone-formoterol 200-5 MCG/ACT Aero Commonly known as:  DULERA Inhale 1 puff into the lungs 2 (two) times daily. What changed:  when to take this    montelukast 10 MG tablet Commonly known as:  SINGULAIR Take 10 mg by mouth at bedtime.   pantoprazole 40 MG tablet Commonly known as:  PROTONIX Take 40 mg by mouth 2 (two) times daily.   predniSONE 50 MG tablet Commonly known as:  DELTASONE Take 1 tablet (50 mg total) by mouth daily with breakfast. Start taking on:  04/05/2016   sucralfate 1 g tablet Commonly known as:  CARAFATE Take 1 g by mouth 4 (four) times daily -  with meals and at bedtime.   theophylline 300 MG 24 hr capsule Commonly known as:  THEO-24 Take 300 mg by mouth daily.   tiotropium 18 MCG inhalation capsule Commonly known as:  SPIRIVA HANDIHALER Place 1 capsule (18 mcg total) into inhaler and inhale daily. Start taking on:  04/05/2016 What changed:  when to take this   VITAMIN C PO Take 1 tablet by mouth daily.   Vitamin D3 1000 units Caps Take 1 capsule by mouth daily.   VOLTAREN 1 % Gel Generic drug:  diclofenac sodium Apply 4 g topically 4 (four) times daily. TO RIGHT HIP       Discharge Assessment: Vitals:   04/03/16 2212 04/04/16 0607  BP: 117/66 128/70  Pulse: 60 (!) 56  Resp: 17 17  Temp: 98.8 F (37.1 C) 98 F (36.7 C)   Skin clean, dry and intact without evidence of skin break down, no evidence  of skin tears noted. IV catheter discontinued intact. Site without signs and symptoms of complications - no redness or edema noted at insertion site, patient denies c/o pain - only slight tenderness at site.  Dressing with slight pressure applied.  D/c Instructions-Education: Discharge instructions given to patient/family with verbalized understanding. D/c education completed with patient/family including follow up instructions, medication list, d/c activities limitations if indicated, with other d/c instructions as indicated by MD - patient able to verbalize understanding, all questions fully answered. Patient instructed to return to ED, call 911, or call MD for any changes in condition.   Patient escorted via WC, and D/C home via private auto.  Francisco Deschepper Consuella Loselaine, RN 04/04/2016 2:15 PM

## 2016-04-08 DIAGNOSIS — M87051 Idiopathic aseptic necrosis of right femur: Secondary | ICD-10-CM

## 2016-04-08 NOTE — Discharge Summary (Signed)
Physician Discharge Summary  Francisco Beard RUE:[id number] DOB: 05/26/1961 DOA: 04/01/2016  PCP: [name], MD  Admit date: 04/01/2016 Discharge date: 04/08/2016  Admitted From: Home Disposition:  Home  Recommendations for Outpatient Follow-up:  1. Follow up with PCP in 1 week 2. Follow up with Pulmonology 3. Follow-up with cardiology for surgical clearance 4. Follow-up with orthopedic surgery in 1 week for follow-up of avascular necrosis   Discharge Condition: Stable CODE STATUS: Full code  Brief/Interim Summary:  HPI written by Dr. Lyda Perone on 04/01/2016  HPI: Francisco Beard is a 55 y.o. male with medical history significant of COPD, still smokes, recent COPD admission and just discharged from [facility] days ago.  Patient presents to the ED with SOB, chest tightness, wheezing.  Symptoms not improved with albuterol, symptoms are persistent and severe.   ED Course: He has respiratory distress with accessory muscle use initially in the ED, this is improved with neb treatments and solumedrol.   Hospital course:  COPD exacerbation Patient found to be wheezing on presentation. On room air. Started on prednisone, azithromycin and albuterol nebulizer treatments.  Avascular necrosis of  Right femoral head This is a chronic issue for the past 8 years. Orthopedic surgery consulted and will manage as an outpatient. Patient sent home with a short prescription of narcotics for analgesics   Discharge Diagnoses:  Principal Problem:   COPD with acute exacerbation (HCC) Active Problems:   Avascular necrosis of bone of right hip [facility])    Discharge Instructions     Medication List    STOP taking these medications   amantadine 100 MG capsule Commonly known as:  SYMMETREL   divalproex 500 MG DR tablet Commonly known as:  DEPAKOTE   fluPHENAZine 10 MG tablet Commonly known as:  PROLIXIN     TAKE these medications   acetaminophen 650 MG CR tablet Commonly known as:   TYLENOL Take 650 mg by mouth every 8 (eight) hours as needed for pain.   albuterol 108 (90 Base) MCG/ACT inhaler Commonly known as:  PROVENTIL HFA;VENTOLIN HFA Inhale 1-2 puffs into the lungs every 4 (four) hours as needed for wheezing or shortness of breath.   albuterol (2.5 MG/3ML) 0.083% nebulizer solution Commonly known as:  PROVENTIL Take 3 mLs (2.5 mg total) by nebulization every 4 (four) hours as needed for wheezing or shortness of breath.   amLODipine 5 MG tablet Commonly known as:  NORVASC Take 1 tablet (5 mg total) by mouth daily.   atorvastatin 20 MG tablet Commonly known as:  LIPITOR Take 20 mg by mouth daily.   DULoxetine 30 MG capsule Commonly known as:  CYMBALTA Take 30 mg by mouth daily.   folic acid 1 MG tablet Commonly known as:  FOLVITE Take 1 mg by mouth daily.   HYDROcodone-acetaminophen 5-325 MG tablet Commonly known as:  NORCO/VICODIN Take 1 tablet by mouth every 6 (six) hours as needed for moderate pain.   loratadine 10 MG tablet Commonly known as:  CLARITIN Take 10 mg by mouth daily as needed for allergies.   mometasone-formoterol 200-5 MCG/ACT Aero Commonly known as:  DULERA Inhale 1 puff into the lungs 2 (two) times daily. What changed:  when to take this   montelukast 10 MG tablet Commonly known as:  SINGULAIR Take 10 mg by mouth at bedtime.   pantoprazole 40 MG tablet Commonly known as:  PROTONIX Take 40 mg by mouth 2 (two) times daily.   sucralfate 1 g tablet Commonly known as:  CARAFATE Take  1 g by mouth 4 (four) times daily -  with meals and at bedtime.   theophylline 300 MG 24 hr capsule Commonly known as:  THEO-24 Take 300 mg by mouth daily.   tiotropium 18 MCG inhalation capsule Commonly known as:  SPIRIVA HANDIHALER Place 1 capsule (18 mcg total) into inhaler and inhale daily. What changed:  when to take this   VITAMIN C PO Take 1 tablet by mouth daily.   Vitamin D3 1000 units Caps Take 1 capsule by mouth daily.    VOLTAREN 1 % Gel Generic drug:  diclofenac sodium Apply 4 g topically 4 (four) times daily. TO RIGHT HIP     ASK your doctor about these medications   azithromycin 250 MG tablet Commonly known as:  ZITHROMAX Take 1 tablet (250 mg total) by mouth daily. Ask about: Should I take this medication?   predniSONE 50 MG tablet Commonly known as:  DELTASONE Take 1 tablet (50 mg total) by mouth daily with breakfast. Ask about: Should I take this medication?      Follow-up Information    GRAVES,Hezzie L, MD. Schedule an appointment as soon as possible for a visit in 10 day(s).   Specialty:  Orthopedic Surgery Contact information: [address] [place] [address] [phone]        [name], [name], MD. Schedule an appointment as soon as possible for a visit in 1 week(s).   Specialty:  Internal Medicine Contact information: [address] [facility] [address] [phone]        [facility]. Schedule an appointment as soon as possible for a visit in 1 week(s).   Specialty:  Pulmonology Why:  Pulmonary function testing Contact information: [address] [place] [id number] [phone]       [facility]. Schedule an appointment as soon as possible for a visit in 1 week(s).   Contact information: [address] [place] [id number] [phone]         Allergies  Allergen Reactions  . Strawberry Extract Anaphylaxis  . Tomato Anaphylaxis  . Aleve [Naproxen] Other (See Comments)    Heart problems so MD told him to not take  . Aspirin Swelling  . Penicillins Swelling    Has patient had a PCN reaction causing immediate rash, facial/tongue/throat swelling, SOB or lightheadedness with hypotension: Yes Has patient had a PCN reaction causing severe rash involving mucus membranes or skin necrosis: Yes Has patient had a PCN reaction that required  hospitalization: Yes Has patient had a PCN reaction occurring within the last 10 years: No If all of the above answers are "NO", then may proceed with Cephalosporin use.   . Seroquel [Quetiapine] Other (See Comments)    In higher doses, this causes excessive sedation  . Onion Nausea And Vomiting and Rash    Consultations:  None   Procedures/Studies: Dg Chest 2 View  Result Date: 04/01/2016 CLINICAL DATA:  Chest pain, chest congestion and wheezing, 3 days duration. EXAM: CHEST  2 VIEW COMPARISON:  03/18/2016 and multiple previous FINDINGS: Heart size is normal. Mediastinal shadows are normal. There are areas of chronic pulmonary scarring in the perihilar lungs bilaterally. There is emphysematous change at both lung apices. No sign of active infiltrate, mass, effusion or collapse. Chronic degenerative changes affect the spine. IMPRESSION: Findings of chronic lung disease. No sign of active infiltrate, collapse or effusion. Electronically Signed   By: Scherrie BatemanMark  Shogry M.D.  On: 04/01/2016 14:38   Dg Hip Unilat With Pelvis 2-3 Views Right  Result Date: 04/01/2016 CLINICAL DATA:  Larey SeatFell a week ago onto RIGHT hip, has been walking but with worsening hip pain, history of arthritis EXAM: DG HIP (WITH OR WITHOUT PELVIS) 2-3V RIGHT COMPARISON:  03/21/2015 FINDINGS: Osseous demineralization. Mixed lytic and sclerotic process involving the RIGHT femoral head with significant femoral head deformity. Findings are consistent with avascular necrosis of the RIGHT femoral head with chronic partial collapse of the femoral head. 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 No definite acute fracture, dislocation, or bone destruction. 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 No definite acute bony abnormalities. Electronically Signed   By: Ulyses SouthwardMark  Boles M.D.   On: 04/01/2016 17:57       Subjective: Patient reports improvement in dyspnea. Still has significant right hip pain.  Discharge Exam: Vitals:   04/03/16 2212 04/04/16 0607  BP: 117/66 128/70  Pulse: 60 (!) 56  Resp: 17 17  Temp: 98.8 F (37.1 C) 98 F (36.7 C)   Vitals:   04/03/16 1500 04/03/16 1958 04/03/16 2212 04/04/16 0607  BP: 117/66  117/66 128/70  Pulse: 67  60 (!) 56  Resp:   17 17  Temp:   98.8 F (37.1 C) 98 F (36.7 C)  TempSrc:   Oral Oral  SpO2: 98% 98% 97% 99%  Weight:    75.4 kg (166 lb 3.6 oz)  Height:        General exam: Appears calm and comfortable. Lying on left side Respiratory system: Clear to auscultation bilaterally. Respiratory effort normal. Cardiovascular system: S1 & S2 heard, RRR. No murmurs, rubs, gallops or clicks. Gastrointestinal system: Abdomen is nondistended, soft and nontender. Normal bowel sounds heard. Central nervous system: Alert and oriented. No focal neurological deficits. Extremities: No edema. No calf tenderness. Right leg tenderness Skin: No cyanosis. No rashes Psychiatry: Judgement and insight appear normal. Mood & affect appropriate.   The results of significant diagnostics from this hospitalization (including imaging, microbiology, ancillary and laboratory) are listed below for reference.     Microbiology: No results found for this or any previous visit (from the past 240 hour(s)).   Labs: BNP (last 3 results)  Recent Labs  04/01/16 1412  BNP 43.2   CBG:  Recent Labs Lab 04/03/16 1200 04/03/16 1636 04/03/16 2213 04/04/16 0752 04/04/16 1205  GLUCAP 120* 161* 123* 143* 108*    Time coordinating discharge: Over 30 minutes  SIGNED:   Jacquelin Hawkingalph Joren Rehm, MD [facility] 04/08/2016, 10:57 PM Pager [phone]  If 7PM-7AM, please contact night-coverage [email or web address] Password [email or web address]

## 2016-04-13 ENCOUNTER — Emergency Department (HOSPITAL_COMMUNITY): Payer: Medicaid Other

## 2016-04-13 ENCOUNTER — Inpatient Hospital Stay (HOSPITAL_COMMUNITY)
Admission: EM | Admit: 2016-04-13 | Discharge: 2016-04-17 | DRG: 191 | Disposition: A | Discharge status: Home / Self Care | Payer: Medicaid Other | Attending: Internal Medicine | Admitting: Internal Medicine

## 2016-04-13 ENCOUNTER — Encounter (HOSPITAL_COMMUNITY): Payer: Self-pay

## 2016-04-13 DIAGNOSIS — E785 Hyperlipidemia, unspecified: Secondary | ICD-10-CM | POA: Diagnosis present

## 2016-04-13 DIAGNOSIS — R791 Abnormal coagulation profile: Secondary | ICD-10-CM | POA: Diagnosis present

## 2016-04-13 DIAGNOSIS — I10 Essential (primary) hypertension: Secondary | ICD-10-CM | POA: Diagnosis present

## 2016-04-13 DIAGNOSIS — R079 Chest pain, unspecified: Secondary | ICD-10-CM

## 2016-04-13 DIAGNOSIS — F319 Bipolar disorder, unspecified: Secondary | ICD-10-CM | POA: Diagnosis present

## 2016-04-13 DIAGNOSIS — K5790 Diverticulosis of intestine, part unspecified, without perforation or abscess without bleeding: Secondary | ICD-10-CM | POA: Diagnosis present

## 2016-04-13 DIAGNOSIS — J449 Chronic obstructive pulmonary disease, unspecified: Secondary | ICD-10-CM | POA: Diagnosis present

## 2016-04-13 DIAGNOSIS — F17200 Nicotine dependence, unspecified, uncomplicated: Secondary | ICD-10-CM | POA: Diagnosis present

## 2016-04-13 DIAGNOSIS — Z880 Allergy status to penicillin: Secondary | ICD-10-CM

## 2016-04-13 DIAGNOSIS — R911 Solitary pulmonary nodule: Secondary | ICD-10-CM | POA: Diagnosis present

## 2016-04-13 DIAGNOSIS — Z79899 Other long term (current) drug therapy: Secondary | ICD-10-CM

## 2016-04-13 DIAGNOSIS — R109 Unspecified abdominal pain: Secondary | ICD-10-CM

## 2016-04-13 DIAGNOSIS — J9811 Atelectasis: Secondary | ICD-10-CM | POA: Diagnosis present

## 2016-04-13 DIAGNOSIS — Z886 Allergy status to analgesic agent status: Secondary | ICD-10-CM | POA: Diagnosis not present

## 2016-04-13 DIAGNOSIS — J441 Chronic obstructive pulmonary disease with (acute) exacerbation: Principal | ICD-10-CM | POA: Diagnosis present

## 2016-04-13 DIAGNOSIS — Z888 Allergy status to other drugs, medicaments and biological substances status: Secondary | ICD-10-CM

## 2016-04-13 DIAGNOSIS — K8020 Calculus of gallbladder without cholecystitis without obstruction: Secondary | ICD-10-CM | POA: Diagnosis present

## 2016-04-13 DIAGNOSIS — K2990 Gastroduodenitis, unspecified, without bleeding: Secondary | ICD-10-CM | POA: Diagnosis not present

## 2016-04-13 DIAGNOSIS — Z91018 Allergy to other foods: Secondary | ICD-10-CM

## 2016-04-13 DIAGNOSIS — R1013 Epigastric pain: Secondary | ICD-10-CM | POA: Diagnosis not present

## 2016-04-13 DIAGNOSIS — R0602 Shortness of breath: Secondary | ICD-10-CM | POA: Diagnosis not present

## 2016-04-13 DIAGNOSIS — K2970 Gastritis, unspecified, without bleeding: Secondary | ICD-10-CM | POA: Diagnosis not present

## 2016-04-13 LAB — CBC
HEMATOCRIT: 46.5 % (ref 39.0–52.0)
Hemoglobin: 15.4 g/dL (ref 13.0–17.0)
MCH: 27.8 pg (ref 26.0–34.0)
MCHC: 33.1 g/dL (ref 30.0–36.0)
MCV: 84.1 fL (ref 78.0–100.0)
PLATELETS: 232 10*3/uL (ref 150–400)
RBC: 5.53 MIL/uL (ref 4.22–5.81)
RDW: 15.3 % (ref 11.5–15.5)
WBC: 5.6 10*3/uL (ref 4.0–10.5)

## 2016-04-13 LAB — BASIC METABOLIC PANEL
ANION GAP: 8 (ref 5–15)
BUN: 11 mg/dL (ref 6–20)
CHLORIDE: 108 mmol/L (ref 101–111)
CO2: 23 mmol/L (ref 22–32)
Calcium: 9.6 mg/dL (ref 8.9–10.3)
Creatinine, Ser: 1.19 mg/dL (ref 0.61–1.24)
GFR calc non Af Amer: >60 mL/min (ref >60)
Glucose, Bld: 95 mg/dL (ref 65–99)
POTASSIUM: 3.7 mmol/L (ref 3.5–5.1)
SODIUM: 139 mmol/L (ref 135–145)

## 2016-04-13 LAB — D-DIMER, QUANTITATIVE (NOT AT ARMC): D DIMER QUANT: 0.71 ug{FEU}/mL — AB (ref 0.00–0.50)

## 2016-04-13 LAB — I-STAT TROPONIN, ED: Troponin i, poc: 0 ng/mL (ref 0.00–0.08)

## 2016-04-13 MED ORDER — GUAIFENESIN ER 600 MG PO TB12
600.0000 mg | ORAL_TABLET | Freq: Two times a day (BID) | ORAL | Status: DC
  Administered 2016-04-13 – 2016-04-17 (×8): 600 mg via ORAL
  Filled 2016-04-13 (×8): qty 1

## 2016-04-13 MED ORDER — LEVOFLOXACIN IN D5W 750 MG/150ML IV SOLN
750.0000 mg | Freq: Once | INTRAVENOUS | Status: AC
  Administered 2016-04-13: 750 mg via INTRAVENOUS
  Filled 2016-04-13: qty 150

## 2016-04-13 MED ORDER — ATORVASTATIN CALCIUM 20 MG PO TABS
20.0000 mg | ORAL_TABLET | Freq: Every day | ORAL | Status: DC
  Administered 2016-04-13 – 2016-04-17 (×5): 20 mg via ORAL
  Filled 2016-04-13 (×5): qty 1

## 2016-04-13 MED ORDER — ONDANSETRON HCL 4 MG/2ML IJ SOLN
4.0000 mg | Freq: Once | INTRAMUSCULAR | Status: AC
  Administered 2016-04-13: 4 mg via INTRAVENOUS
  Filled 2016-04-13: qty 2

## 2016-04-13 MED ORDER — ALBUTEROL SULFATE (2.5 MG/3ML) 0.083% IN NEBU
5.0000 mg | INHALATION_SOLUTION | Freq: Once | RESPIRATORY_TRACT | Status: AC
  Administered 2016-04-13: 5 mg via RESPIRATORY_TRACT
  Filled 2016-04-13: qty 6

## 2016-04-13 MED ORDER — PROCHLORPERAZINE EDISYLATE 5 MG/ML IJ SOLN
10.0000 mg | Freq: Once | INTRAMUSCULAR | Status: AC
  Administered 2016-04-13: 10 mg via INTRAVENOUS
  Filled 2016-04-13: qty 2

## 2016-04-13 MED ORDER — ENOXAPARIN SODIUM 40 MG/0.4ML ~~LOC~~ SOLN
40.0000 mg | SUBCUTANEOUS | Status: DC
  Filled 2016-04-13: qty 0.4

## 2016-04-13 MED ORDER — LEVALBUTEROL HCL 0.63 MG/3ML IN NEBU: 0.6300 mg | INHALATION_SOLUTION | Freq: Four times a day (QID) | RESPIRATORY_TRACT | Status: DC | PRN

## 2016-04-13 MED ORDER — LEVALBUTEROL HCL 0.63 MG/3ML IN NEBU
0.6300 mg | INHALATION_SOLUTION | Freq: Four times a day (QID) | RESPIRATORY_TRACT | Status: DC
  Administered 2016-04-14 – 2016-04-16 (×6): 0.63 mg via RESPIRATORY_TRACT
  Filled 2016-04-13 (×11): qty 3

## 2016-04-13 MED ORDER — DICLOFENAC SODIUM 1 % TD GEL
4.0000 g | Freq: Four times a day (QID) | TRANSDERMAL | Status: DC
  Administered 2016-04-13 – 2016-04-16 (×12): 4 g via TOPICAL
  Filled 2016-04-13: qty 100

## 2016-04-13 MED ORDER — FENTANYL CITRATE (PF) 100 MCG/2ML IJ SOLN
50.0000 ug | Freq: Once | INTRAMUSCULAR | Status: AC
  Administered 2016-04-13: 50 ug via INTRAVENOUS
  Filled 2016-04-13: qty 2

## 2016-04-13 MED ORDER — ONDANSETRON HCL 4 MG PO TABS: 4.0000 mg | ORAL_TABLET | Freq: Four times a day (QID) | ORAL | Status: DC | PRN

## 2016-04-13 MED ORDER — ACETAMINOPHEN 325 MG PO TABS: 650.0000 mg | ORAL_TABLET | Freq: Three times a day (TID) | ORAL | Status: DC | PRN

## 2016-04-13 MED ORDER — TIOTROPIUM BROMIDE MONOHYDRATE 18 MCG IN CAPS
18.0000 ug | ORAL_CAPSULE | Freq: Every day | RESPIRATORY_TRACT | Status: DC
Start: 2016-04-14 — End: 2016-04-14
  Filled 2016-04-13: qty 5

## 2016-04-13 MED ORDER — IOPAMIDOL (ISOVUE-370) INJECTION 76%
INTRAVENOUS | Status: AC
  Administered 2016-04-13: 100 mL
  Filled 2016-04-13: qty 100

## 2016-04-13 MED ORDER — METHYLPREDNISOLONE SODIUM SUCC 40 MG IJ SOLR
40.0000 mg | Freq: Two times a day (BID) | INTRAMUSCULAR | Status: DC
  Administered 2016-04-13 – 2016-04-15 (×4): 40 mg via INTRAVENOUS
  Filled 2016-04-13 (×4): qty 1

## 2016-04-13 MED ORDER — AMLODIPINE BESYLATE 5 MG PO TABS
5.0000 mg | ORAL_TABLET | Freq: Every day | ORAL | Status: DC
  Administered 2016-04-13 – 2016-04-14 (×2): 5 mg via ORAL
  Filled 2016-04-13 (×2): qty 1

## 2016-04-13 MED ORDER — FOLIC ACID 1 MG PO TABS
1.0000 mg | ORAL_TABLET | Freq: Every day | ORAL | Status: DC
  Administered 2016-04-14 – 2016-04-17 (×4): 1 mg via ORAL
  Filled 2016-04-13 (×4): qty 1

## 2016-04-13 MED ORDER — ONDANSETRON HCL 4 MG/2ML IJ SOLN
4.0000 mg | Freq: Four times a day (QID) | INTRAMUSCULAR | Status: DC | PRN
  Administered 2016-04-13 – 2016-04-15 (×3): 4 mg via INTRAVENOUS
  Filled 2016-04-13 (×3): qty 2

## 2016-04-13 MED ORDER — GI COCKTAIL ~~LOC~~
30.0000 mL | Freq: Once | ORAL | Status: AC
  Administered 2016-04-13: 30 mL via ORAL
  Filled 2016-04-13: qty 30

## 2016-04-13 MED ORDER — METHYLPREDNISOLONE SODIUM SUCC 125 MG IJ SOLR
125.0000 mg | Freq: Once | INTRAMUSCULAR | Status: AC
  Administered 2016-04-13: 125 mg via INTRAVENOUS
  Filled 2016-04-13: qty 2

## 2016-04-13 MED ORDER — HYDROCODONE-ACETAMINOPHEN 5-325 MG PO TABS
1.0000 | ORAL_TABLET | Freq: Four times a day (QID) | ORAL | Status: DC | PRN
  Administered 2016-04-13 – 2016-04-17 (×8): 1 via ORAL
  Filled 2016-04-13 (×8): qty 1

## 2016-04-13 MED ORDER — DULOXETINE HCL 30 MG PO CPEP
30.0000 mg | ORAL_CAPSULE | Freq: Every day | ORAL | Status: DC
  Administered 2016-04-13 – 2016-04-17 (×5): 30 mg via ORAL
  Filled 2016-04-13 (×5): qty 1

## 2016-04-13 MED ORDER — LORATADINE 10 MG PO TABS: 10.0000 mg | ORAL_TABLET | Freq: Every day | ORAL | Status: DC | PRN

## 2016-04-13 MED ORDER — SUCRALFATE 1 G PO TABS
1.0000 g | ORAL_TABLET | Freq: Three times a day (TID) | ORAL | Status: DC
  Administered 2016-04-13 – 2016-04-17 (×13): 1 g via ORAL
  Filled 2016-04-13 (×13): qty 1

## 2016-04-13 MED ORDER — THEOPHYLLINE ER 300 MG PO CP24
300.0000 mg | ORAL_CAPSULE | Freq: Every day | ORAL | Status: DC
  Filled 2016-04-13: qty 1

## 2016-04-13 MED ORDER — SODIUM CHLORIDE 0.9% FLUSH
3.0000 mL | Freq: Two times a day (BID) | INTRAVENOUS | Status: DC
  Administered 2016-04-13 – 2016-04-16 (×5): 3 mL via INTRAVENOUS

## 2016-04-13 MED ORDER — PROCHLORPERAZINE MALEATE 10 MG PO TABS
10.0000 mg | ORAL_TABLET | Freq: Four times a day (QID) | ORAL | Status: DC | PRN
  Administered 2016-04-14 – 2016-04-15 (×2): 10 mg via ORAL
  Filled 2016-04-13 (×3): qty 1

## 2016-04-13 MED ORDER — THEOPHYLLINE ER 300 MG PO TB12
300.0000 mg | ORAL_TABLET | Freq: Every day | ORAL | Status: DC
  Administered 2016-04-13 – 2016-04-17 (×5): 300 mg via ORAL
  Filled 2016-04-13 (×5): qty 1

## 2016-04-13 MED ORDER — IPRATROPIUM BROMIDE 0.02 % IN SOLN
0.5000 mg | Freq: Four times a day (QID) | RESPIRATORY_TRACT | Status: DC
  Administered 2016-04-14 – 2016-04-16 (×6): 0.5 mg via RESPIRATORY_TRACT
  Filled 2016-04-13 (×11): qty 2.5

## 2016-04-13 MED ORDER — PANTOPRAZOLE SODIUM 40 MG PO TBEC
40.0000 mg | DELAYED_RELEASE_TABLET | Freq: Two times a day (BID) | ORAL | Status: DC
  Administered 2016-04-13 – 2016-04-17 (×8): 40 mg via ORAL
  Filled 2016-04-13 (×8): qty 1

## 2016-04-13 MED ORDER — MONTELUKAST SODIUM 10 MG PO TABS
10.0000 mg | ORAL_TABLET | Freq: Every day | ORAL | Status: DC
  Administered 2016-04-13 – 2016-04-16 (×4): 10 mg via ORAL
  Filled 2016-04-13 (×4): qty 1

## 2016-04-13 MED ORDER — MOMETASONE FURO-FORMOTEROL FUM 200-5 MCG/ACT IN AERO
1.0000 | INHALATION_SPRAY | Freq: Two times a day (BID) | RESPIRATORY_TRACT | Status: DC
  Administered 2016-04-14 – 2016-04-17 (×5): 1 via RESPIRATORY_TRACT
  Filled 2016-04-13: qty 8.8

## 2016-04-13 NOTE — ED Notes (Signed)
Pt reports he has sleep apnea and his oxygen saturation drops when he falls asleep. Pts O2 dropped tp 69%. Dr. Rubin PayorPickering was made aware. Pt placed on NRB and O2 increased to 100%. Pt awake now. NRB removed anf pt placed on 2L [place]. Then, Pt transported to CT accompanied by Kennyth ArnoldStacy, RN with continuous pulse oximetry monitoring.

## 2016-04-13 NOTE — Progress Notes (Signed)
Notified provider of patient continued nausea and complaint of chest pain. Orders received for EKG, stat Troponin and IV dose of compazine. Dr notified of EKG results. Compazine given IV per order. Will continue to monitor. Petra Kubarica Zayneb Baucum RN 04/13/2016 11:26 PM

## 2016-04-13 NOTE — H&P (Signed)
History and Physical    Francisco Beard:[id number] DOB: [birth year] DOA: 04/13/2016  Referring MD/NP/PA: Dr. Criss Alvine  PCP: [name], MD   Patient coming from: home  Chief Complaint: dyspnea   HPI: Francisco Beard is a 55 y.o. male with known COPD, still smokes, recent COPD admission and just discharged recently. Patient presents to the ED with SOB, chest tightness, wheezing. Symptoms not improved with albuterol, symptoms are persistent and severe, worse with exertion, associated with productive cough of yellow sputum, no fevers, chills. Pt denies chest pain,no abd or urinary concerns.   ED Course:He is currenlty hemodynamically stable and with oxygen saturation 96% on 2 L oxygen via [place]. Blood work reviewed, stable. TEle bed requested.   Review of Systems:  Constitutional: Negative for appetite change and fatigue.  HENT: Negative for ear pain, nosebleeds, congestion, facial swelling, rhinorrhea, neck pain, neck stiffness and ear discharge.   Eyes: Negative for pain, discharge, redness, itching and visual disturbance.  Respiratory: Negative for chocking or stridor.   Cardiovascular: Negative for chest pain, palpitations and leg swelling.  Gastrointestinal: Negative for abdominal distention.  Genitourinary: Negative for dysuria, urgency, frequency, hematuria, flank pain, decreased urine volume, difficulty urinating and dyspareunia.  Musculoskeletal: Negative for back pain, joint swelling, arthralgias and gait problem.  Neurological: Negative for dizziness, tremors, seizures, syncope, facial asymmetry, speech difficulty, weakness  Hematological: Negative for adenopathy. Does not bruise/bleed easily.  Psychiatric/Behavioral: Negative for hallucinations, behavioral problems, confusion, dysphoric mood, decreased concentration and agitation.   Past Medical History:  Diagnosis Date  . Asthma   . Bipolar disorder (HCC)   . CHF (congestive heart failure) (HCC)   . Chronic hip pain   .  COPD (chronic obstructive pulmonary disease) (HCC)   . Depression   . Hernia   . Hypertension     Past Surgical History:  Procedure Laterality Date  . HAND SURGERY    . shoulder surgery     Social Hx:  reports that he has been smoking.  He has never used smokeless tobacco. He reports that he drinks about 17.2 oz of alcohol per week . He reports that he uses drugs, including Cocaine.  Allergies  Allergen Reactions  . Strawberry Extract Anaphylaxis  . Tomato Anaphylaxis  . Aleve [Naproxen] Other (See Comments)    Heart problems so MD told him to not take  . Aspirin Swelling  . Penicillins Swelling    Has patient had a PCN reaction causing immediate rash, facial/tongue/throat swelling, SOB or lightheadedness with hypotension: Yes Has patient had a PCN reaction causing severe rash involving mucus membranes or skin necrosis: Yes Has patient had a PCN reaction that required hospitalization: Yes Has patient had a PCN reaction occurring within the last 10 years: No If all of the above answers are "NO", then may proceed with Cephalosporin use.   . Seroquel [Quetiapine] Other (See Comments)    In higher doses, this causes excessive sedation  . Onion Nausea And Vomiting and Rash    Family history of HTN  Prior to Admission medications   Medication Sig Start Date End Date Taking? Authorizing Provider  acetaminophen (TYLENOL) 650 MG CR tablet Take 650 mg by mouth every 8 (eight) hours as needed for pain.    Historical Provider, MD  albuterol (PROVENTIL HFA;VENTOLIN HFA) 108 (90 BASE) MCG/ACT inhaler Inhale 1-2 puffs into the lungs every 4 (four) hours as needed for wheezing or shortness of breath.    Historical Provider, MD  albuterol (PROVENTIL) (2.5 MG/3ML) 0.083% nebulizer solution  Take 3 mLs (2.5 mg total) by nebulization every 4 (four) hours as needed for wheezing or shortness of breath. 04/28/13   Susy Frizzleharles Sheldon, MD  amLODipine (NORVASC) 5 MG tablet Take 1 tablet (5 mg total) by  mouth daily. 04/04/16   Narda Bondsalph A Nettey, MD  Ascorbic Acid (VITAMIN C PO) Take 1 tablet by mouth daily.    Historical Provider, MD  atorvastatin (LIPITOR) 20 MG tablet Take 20 mg by mouth daily.    Historical Provider, MD  Cholecalciferol (VITAMIN D3) 1000 units CAPS Take 1 capsule by mouth daily.    Historical Provider, MD  diclofenac sodium (VOLTAREN) 1 % GEL Apply 4 g topically 4 (four) times daily. TO RIGHT HIP    Historical Provider, MD  DULoxetine (CYMBALTA) 30 MG capsule Take 30 mg by mouth daily.    Historical Provider, MD  folic acid (FOLVITE) 1 MG tablet Take 1 mg by mouth daily.    Historical Provider, MD  HYDROcodone-acetaminophen (NORCO/VICODIN) 5-325 MG tablet Take 1 tablet by mouth every 6 (six) hours as needed for moderate pain. 04/04/16   Narda Bondsalph A Nettey, MD  loratadine (CLARITIN) 10 MG tablet Take 10 mg by mouth daily as needed for allergies.    Historical Provider, MD  mometasone-formoterol (DULERA) 200-5 MCG/ACT AERO Inhale 1 puff into the lungs 2 (two) times daily. 04/04/16   Narda Bondsalph A Nettey, MD  montelukast (SINGULAIR) 10 MG tablet Take 10 mg by mouth at bedtime.    Historical Provider, MD  pantoprazole (PROTONIX) 40 MG tablet Take 40 mg by mouth 2 (two) times daily.    Historical Provider, MD  sucralfate (CARAFATE) 1 g tablet Take 1 g by mouth 4 (four) times daily -  with meals and at bedtime.    Historical Provider, MD  theophylline (THEO-24) 300 MG 24 hr capsule Take 300 mg by mouth daily.    Historical Provider, MD  tiotropium (SPIRIVA HANDIHALER) 18 MCG inhalation capsule Place 1 capsule (18 mcg total) into inhaler and inhale daily. 04/05/16   Narda Bondsalph A Nettey, MD    Physical Exam: Vitals:   04/13/16 1700 04/13/16 1730 04/13/16 1800 04/13/16 1830  BP: 115/73 115/74 121/75 136/87  Pulse: 77 62 75 (!) 59  Resp: (!) 32 19 22   Temp:      TempSrc:      SpO2: 97% 98% 100% 99%  Weight:      Height:        Constitutional: NAD, calm, comfortable Vitals:   04/13/16 1700  04/13/16 1730 04/13/16 1800 04/13/16 1830  BP: 115/73 115/74 121/75 136/87  Pulse: 77 62 75 (!) 59  Resp: (!) 32 19 22   Temp:      TempSrc:      SpO2: 97% 98% 100% 99%  Weight:      Height:       Eyes: PERRL, lids and conjunctivae normal, disheveled look  ENMT: Mucous membranes are moist. Posterior pharynx clear of any exudate or lesions.Normal dentition.  Neck: normal, supple, no masses, no thyromegaly Respiratory: course breath sounds bilaterally with exp wheezing  Cardiovascular: Regular rate and rhythm, no murmurs / rubs / gallops. No extremity edema. 2+ pedal pulses. No carotid bruits.  Abdomen: no tenderness, no masses palpated. No hepatosplenomegaly. Bowel sounds positive.  Musculoskeletal: no clubbing / cyanosis. No joint deformity upper and lower extremities. Good ROM, no contractures. Normal muscle tone.  Skin: no rashes, lesions, ulcers. No induration Neurologic: CN 2-12 grossly intact. Sensation intact, DTR normal. Strength 5/5 in  all 4.  Psychiatric: Normal judgment and insight. Alert and oriented x 3. Normal mood.   Labs on Admission: I have personally reviewed following labs and imaging studies  CBC:  Recent Labs Lab 04/13/16 1355  WBC 5.6  HGB 15.4  HCT 46.5  MCV 84.1  PLT 232   Basic Metabolic Panel:  Recent Labs Lab 04/13/16 1355  NA 139  K 3.7  CL 108  CO2 23  GLUCOSE 95  BUN 11  CREATININE 1.19  CALCIUM 9.6   Radiological Exams on Admission: Dg Chest 2 View  Result Date: 04/13/2016 CLINICAL DATA:  COPD, chest pain and shortness of breath EXAM: CHEST  2 VIEW COMPARISON:  04/01/2016 FINDINGS: Background bullous emphysema with apical blebs, worse on the right. Slightly lower lung volumes with bibasilar atelectasis. Normal heart size and vascularity. No focal pneumonia, collapse or consolidation. Negative for edema, effusion or pneumothorax. Trachea is midline. Minor thoracic endplate degenerative changes. IMPRESSION: Chronic background bullous  emphysema. lower lung volumes with bibasilar atelectasis. Electronically Signed   By: Judie Petit.  Shick M.D.   On: 04/13/2016 14:08   Ct Head Wo Contrast  Result Date: 04/13/2016 CLINICAL DATA:  Patient with cough, chest pain and shortness of breath. Unresponsive. Elevated D-dimer. EXAM: CT HEAD WITHOUT CONTRAST TECHNIQUE: Contiguous axial images were obtained from the base of the skull through the vertex without intravenous contrast. COMPARISON:  Brain CT 03/12/2016. FINDINGS: Brain: Ventricles and sulci are appropriate for patient's age. No evidence for acute cortically based infarct, intracranial hemorrhage, mass lesion or mass-effect. Vascular: Unremarkable. Skull: Intact. Sinuses/Orbits: Mucosal thickening and fluid within the left maxillary sinus. Ethmoid air cell mucosal thickening. Mastoid air cells unremarkable. Other: None. IMPRESSION: No acute intracranial process. Mucosal thickening and fluid within the left maxillary sinus and ethmoid air cells. Electronically Signed   By: Annia Belt M.D.   On: 04/13/2016 16:49   Ct Angio Chest Pe W And/or Wo Contrast  Result Date: 04/13/2016 CLINICAL DATA:  Chest pain and coughing.  Shortness of breath. EXAM: CT ANGIOGRAPHY CHEST WITH CONTRAST TECHNIQUE: Multidetector CT imaging of the chest was performed using the standard protocol during bolus administration of intravenous contrast. Multiplanar CT image reconstructions and MIPs were obtained to evaluate the vascular anatomy. CONTRAST:  100 cc of Isovue 370 COMPARISON:  Chest x-ray from earlier today and CT scan September 15, 2015 FINDINGS: Cardiovascular: Coronary artery calcifications are identified. The thoracic aorta is normal in caliber with no dissection. The heart is unchanged. Evaluation for emboli in the bases is limited due to respiratory motion. There is significant resulting stairstep artifact which limits evaluation for emboli beyond the segmental level. A filling defect seen in a left lower lobe pulmonary  artery on series 15, image 173 is favored to be artifactual, given the extensive artifact in this region and the lack of filling defects seen elsewhere. Mediastinum/Nodes: There is a small hiatal hernia. No adenopathy. No effusions. Lungs/Pleura: Central airways are normal. No pneumothorax. Bullous changes are seen in the lung apices, right greater than left. Emphysematous changes identified. In the left upper lobe. A nodule in the right mid lung on series 16, image 52, abutting the fissure, measures 6 mm on axial images. On coronal images, this appears to represent some atelectasis with adjacent thickening of the fissure. The finding is not particularly nodular on the sagittal image either. Mild atelectasis in the right upper lung laterally. There is a ground-glass nodule in the left apex which is ill defined measuring up to 10 mm.  This has been present since March of 2014. No other nodules. No masses or infiltrates. Upper Abdomen: No acute abnormality. Musculoskeletal: No chest wall abnormality. No acute or significant osseous findings. Review of the MIP images confirms the above findings. IMPRESSION: 1. Evaluation for pulmonary emboli is limited in the lung bases due to significant respiratory motion. No definitive pulmonary emboli identified. A filling defect in the left lower lobe as described above is felt to be artifactual rather than a single embolus. No central emboli are identified. If there is continued concern, a V/Q scan could further evaluate. 2. There is a ground-glass nodule in the left upper lobe. This has been present since 2014. It is more prominent today, possibly due to difference in slice selection. Recommend attention on a follow-up chest CT in 3 months. 3. 6 mm nodule in the right lung base. Based on coronal and sagittal images, this is felt to likely represent atelectasis and adjacent pleural thickening. Recommend attention on follow-up. Electronically Signed   By: Gerome Samavid  Williams III M.D    On: 04/13/2016 17:03    EKG: pending   Assessment/Plan Active Problems:   COPD exacerbation (HCC) - acute exacerbation, requiring admission for stabilization - place on solumedrol IV, BD' scheduled and as needed - keep on oxygen via [place]    HTN, essential - continue home medical regimen    HLD - continue statin  DVT prophylaxis: Lovenox SQ Code Status: Full Family Communication: Pt updated at bedside Disposition Plan: Home in few days  Consults called: None Admission status: Inpatient   Debbora PrestoMAGICK-Mcdaniel Ohms MD [facility] Pager [phone]  If 7PM-7AM, please contact night-coverage [email or web address] Password [email or web address]  04/13/2016, 7:10 PM

## 2016-04-13 NOTE — ED Provider Notes (Addendum)
[facility]-EMERGENCY DEPT Provider Note   CSN: [id number] Arrival date & time: 04/13/16  1312     History   Chief Complaint Chief Complaint  Patient presents with  . Chest Pain  . Shortness of Breath    HPI Tarvares Lant is a 55 y.o. male.  HPI Patient presents with chest pain and shortness of breath cough nausea vomiting. States his had it for the last 4 days. Recent admission to hospital for COPD exacerbation. Discharged around a week ago. States the pain is dull. States it is severe. States it is worse with the stone out. States he has had coughing. No relief with a 7 breathing treatments at home. Pain is dull.   Past Medical History:  Diagnosis Date  . Asthma   . Bipolar disorder (HCC)   . CHF (congestive heart failure) (HCC)   . Chronic hip pain   . COPD (chronic obstructive pulmonary disease) (HCC)   . Depression   . Hernia   . Hypertension     Patient Active Problem List   Diagnosis Date Noted  . Avascular necrosis of bone of right hip (HCC) 04/08/2016  . COPD with acute exacerbation (HCC) 04/01/2016  . Suicidal ideations 08/17/2013    Past Surgical History:  Procedure Laterality Date  . HAND SURGERY    . shoulder surgery         Home Medications    Prior to Admission medications   Medication Sig Start Date End Date Taking? Authorizing Provider  acetaminophen (TYLENOL) 650 MG CR tablet Take 650 mg by mouth every 8 (eight) hours as needed for pain.    Historical Provider, MD  albuterol (PROVENTIL HFA;VENTOLIN HFA) 108 (90 BASE) MCG/ACT inhaler Inhale 1-2 puffs into the lungs every 4 (four) hours as needed for wheezing or shortness of breath.    Historical Provider, MD  albuterol (PROVENTIL) (2.5 MG/3ML) 0.083% nebulizer solution Take 3 mLs (2.5 mg total) by nebulization every 4 (four) hours as needed for wheezing or shortness of breath. 04/28/13   Susy Frizzle, MD  amLODipine (NORVASC) 5 MG tablet Take 1 tablet (5 mg total) by mouth daily. 04/04/16   Narda Bonds, MD  Ascorbic Acid (VITAMIN C PO) Take 1 tablet by mouth daily.    Historical Provider, MD  atorvastatin (LIPITOR) 20 MG tablet Take 20 mg by mouth daily.    Historical Provider, MD  Cholecalciferol (VITAMIN D3) 1000 units CAPS Take 1 capsule by mouth daily.    Historical Provider, MD  diclofenac sodium (VOLTAREN) 1 % GEL Apply 4 g topically 4 (four) times daily. TO RIGHT HIP    Historical Provider, MD  DULoxetine (CYMBALTA) 30 MG capsule Take 30 mg by mouth daily.    Historical Provider, MD  folic acid (FOLVITE) 1 MG tablet Take 1 mg by mouth daily.    Historical Provider, MD  HYDROcodone-acetaminophen (NORCO/VICODIN) 5-325 MG tablet Take 1 tablet by mouth every 6 (six) hours as needed for moderate pain. 04/04/16   Narda Bonds, MD  loratadine (CLARITIN) 10 MG tablet Take 10 mg by mouth daily as needed for allergies.    Historical Provider, MD  mometasone-formoterol (DULERA) 200-5 MCG/ACT AERO Inhale 1 puff into the lungs 2 (two) times daily. 04/04/16   Narda Bonds, MD  montelukast (SINGULAIR) 10 MG tablet Take 10 mg by mouth at bedtime.    Historical Provider, MD  pantoprazole (PROTONIX) 40 MG tablet Take 40 mg by mouth 2 (two) times daily.    Historical Provider,  MD  sucralfate (CARAFATE) 1 g tablet Take 1 g by mouth 4 (four) times daily -  with meals and at bedtime.    Historical Provider, MD  theophylline (THEO-24) 300 MG 24 hr capsule Take 300 mg by mouth daily.    Historical Provider, MD  tiotropium (SPIRIVA HANDIHALER) 18 MCG inhalation capsule Place 1 capsule (18 mcg total) into inhaler and inhale daily. 04/05/16   Narda Bondsalph A Nettey, MD    Family History No family history on file.  Social History Social History  Substance Use Topics  . Smoking status: Current Every Day Smoker  . Smokeless tobacco: Never Used  . Alcohol use 17.2 oz/week    12 Cans of beer, 20 Standard drinks or equivalent per week     Allergies   Strawberry extract; Tomato; Aleve [naproxen]; Aspirin;  Penicillins; Seroquel [quetiapine]; and Onion   Review of Systems Review of Systems  Constitutional: Positive for appetite change.  HENT: Negative for congestion.   Eyes: Negative for pain.  Respiratory: Positive for shortness of breath.   Cardiovascular: Positive for chest pain.  Gastrointestinal: Positive for nausea and vomiting.  Genitourinary: Negative for dysuria.  Musculoskeletal: Negative for back pain.  Skin: Negative for wound.  Neurological: Negative for headaches.  Psychiatric/Behavioral: Negative for agitation.     Physical Exam Updated Vital Signs BP 126/86   Pulse 60   Temp 98.6 F (37 C) (Oral)   Resp 24   Ht 5\' 11"  (1.803 m)   Wt 180 lb (81.6 kg)   SpO2 100%   BMI 25.10 kg/m   Physical Exam  Constitutional: He appears well-developed.  HENT:  Head: Atraumatic.  Eyes: Pupils are equal, round, and reactive to light.  Neck: Neck supple.  Cardiovascular: Normal rate.   Pulmonary/Chest:  Diffuse harsh breath sounds  Abdominal: Soft. There is no tenderness.  Musculoskeletal: He exhibits no edema.  Neurological: He is alert.  Skin: Skin is warm.     ED Treatments / Results  Labs (all labs ordered are listed, but only abnormal results are displayed) Labs Reviewed  D-DIMER, QUANTITATIVE (NOT [facility]) - Abnormal; Notable for the following:       Result Value   D-Dimer, Quant 0.71 (*)    All other components within normal limits  BASIC METABOLIC PANEL  CBC  I-STAT TROPOININ, ED    EKG  EKG Interpretation  Date/Time:  Saturday April 13 2016 13:17:04 EST Ventricular Rate:  60 PR Interval:    QRS Duration: 75 QT Interval:  418 QTC Calculation: 418 R Axis:   87 Text Interpretation:  Sinus rhythm Minimal ST elevation, inferior leads No significant change since last tracing Confirmed by Rubin PayorPICKERING  MD, Harrold DonathNATHAN [phone]([id number]) on 04/13/2016 1:23:50 PM       Radiology Dg Chest 2 View  Result Date: 04/13/2016 CLINICAL DATA:  COPD, chest pain and  shortness of breath EXAM: CHEST  2 VIEW COMPARISON:  04/01/2016 FINDINGS: Background bullous emphysema with apical blebs, worse on the right. Slightly lower lung volumes with bibasilar atelectasis. Normal heart size and vascularity. No focal pneumonia, collapse or consolidation. Negative for edema, effusion or pneumothorax. Trachea is midline. Minor thoracic endplate degenerative changes. IMPRESSION: Chronic background bullous emphysema. lower lung volumes with bibasilar atelectasis. Electronically Signed   By: Judie PetitM.  Shick M.D.   On: 04/13/2016 14:08    Procedures Procedures (including critical care time)  Medications Ordered in ED Medications  methylPREDNISolone sodium succinate (SOLU-MEDROL) 125 mg/2 mL injection 125 mg (not administered)  albuterol (PROVENTIL) (  2.5 MG/3ML) 0.083% nebulizer solution 5 mg (5 mg Nebulization Given 04/13/16 1346)  ondansetron (ZOFRAN) injection 4 mg (4 mg Intravenous Given 04/13/16 1434)  gi cocktail (Maalox,Lidocaine,Donnatal) (30 mLs Oral Given 04/13/16 1433)  fentaNYL (SUBLIMAZE) injection 50 mcg (50 mcg Intravenous Given 04/13/16 1501)  ondansetron (ZOFRAN) injection 4 mg (4 mg Intravenous Given 04/13/16 1501)  iopamidol (ISOVUE-370) 76 % injection (100 mLs  Contrast Given 04/13/16 1515)     Initial Impression / Assessment and Plan / ED Course  I have reviewed the triage vital signs and the nursing notes.  Pertinent labs & imaging results that were available during my care of the patient were reviewed by me and considered in my medical decision making (see chart for details).  Clinical Course     Patient presents with chest pain nausea vomiting. Also shortness of breath. Recent admission for pneumonia. Found to have negative troponin. D-dimer done due to recent admission hospital and was elevated. Will require CT angiography, however has had difficulty getting good IV access and the axis we did had Lou during CT testing. Patient did fall asleep in the ER  and found that he had Sats to around 70%. Likely will require admission to hospital. Will get IV access.  Care turned over to Dr Criss AlvineGoldston   Final Clinical Impressions(s) / ED Diagnoses   Final diagnoses:  COPD exacerbation (HCC)  Chest pain, unspecified type    New Prescriptions New Prescriptions   No medications on file     Benjiman CoreNathan Tamesha Ellerbrock, MD 04/13/16 1533    Benjiman CoreNathan Corde Antonini, MD 04/13/16 1549

## 2016-04-13 NOTE — Progress Notes (Addendum)
Called re: Nursing due to emesis, chest pain.  These symptoms have been persistent since admission and ongoing for 4 days. Initial troponin was 0.00.  EKG showed sinus rhythm with TWI in V2 only.  He is being treated for a COPD exacerbation.  D dimer elevated, but no PE seen on CT scan of the chest.  He continues to have emesis with zofran not improving his symptoms.  Will given compazine in addition to see if this can help his symptoms.  It appears the chest pain was thought to be due to coughing and COPD exacerbation based on review of notes.  Vitals are WNL at this time with BP 142/80s, pulse in the 50s.  Saturating 100% at last check.   EKG being done now.  Troponin stat X 1 ordered followed by trend every 6 hours.   Nursing to call back if emesis not improved with compazine.   UPDATE:  EKG reassuring, unchanged from previous.  Discussed with nursing, will try IV compazine and I will follow up troponin later tonight.   Troponin X 2 also reassuring.  Patient N/V responded to compazine.    Debe CoderMULLEN, EMILY, MD

## 2016-04-13 NOTE — ED Notes (Signed)
Pt transported to xray at this time

## 2016-04-13 NOTE — ED Provider Notes (Signed)
CT scan without obvious PE. Probably has a COPD exacerbation. On my exam he is still a little wheezy. We'll give Levaquin, he has are to guns steroids, and Dr. Izola PriceMyers will admit to inpatient telemetry monitoring.   Pricilla LovelessScott Jahsir Rama, MD 04/13/16 [phone]

## 2016-04-13 NOTE — ED Triage Notes (Signed)
PER EMS: pt here for left sided CP, SOB and nausea x 4 days. CP radiates to left arm. Wheezing heard bilaterally en route, EMS gave one albuterol txt and one duoneb. BP-116/80, HR-60, CBG-107, O2-100%. A&Ox4.

## 2016-04-13 NOTE — Progress Notes (Signed)
Upon arrival sats were 84%, patient sats dropped to 69% with RN talking to patient telling him to take some deep breaths. Placed patient on NRB due to sats continuously dropping. Very difficult to awake patient at this time. Patient tachypneic and moaning at this time. MD made aware.

## 2016-04-14 DIAGNOSIS — E785 Hyperlipidemia, unspecified: Secondary | ICD-10-CM

## 2016-04-14 DIAGNOSIS — R1013 Epigastric pain: Secondary | ICD-10-CM

## 2016-04-14 LAB — BASIC METABOLIC PANEL
Anion gap: 12 (ref 5–15)
BUN: 13 mg/dL (ref 6–20)
CALCIUM: 9.3 mg/dL (ref 8.9–10.3)
CHLORIDE: 109 mmol/L (ref 101–111)
CO2: 17 mmol/L — ABNORMAL LOW (ref 22–32)
CREATININE: 1.08 mg/dL (ref 0.61–1.24)
GFR calc Af Amer: >60 mL/min (ref >60)
GFR calc non Af Amer: >60 mL/min (ref >60)
Glucose, Bld: 132 mg/dL — ABNORMAL HIGH (ref 65–99)
Potassium: 4.7 mmol/L (ref 3.5–5.1)
SODIUM: 138 mmol/L (ref 135–145)

## 2016-04-14 LAB — CBC
HEMATOCRIT: 46.2 % (ref 39.0–52.0)
Hemoglobin: 15.5 g/dL (ref 13.0–17.0)
MCH: 27.9 pg (ref 26.0–34.0)
MCHC: 33.5 g/dL (ref 30.0–36.0)
MCV: 83.1 fL (ref 78.0–100.0)
Platelets: 249 10*3/uL (ref 150–400)
RBC: 5.56 MIL/uL (ref 4.22–5.81)
RDW: 15.1 % (ref 11.5–15.5)
WBC: 5.9 10*3/uL (ref 4.0–10.5)

## 2016-04-14 LAB — TROPONIN I
Troponin I: <0.03 ng/mL (ref <0.03)
Troponin I: <0.03 ng/mL (ref <0.03)
Troponin I: <0.03 ng/mL (ref <0.03)

## 2016-04-14 MED ORDER — LEVALBUTEROL HCL 0.63 MG/3ML IN NEBU: 0.6300 mg | INHALATION_SOLUTION | RESPIRATORY_TRACT | Status: DC | PRN

## 2016-04-14 MED ORDER — GI COCKTAIL ~~LOC~~
30.0000 mL | Freq: Once | ORAL | Status: AC
  Administered 2016-04-14: 30 mL via ORAL
  Filled 2016-04-14: qty 30

## 2016-04-14 MED ORDER — ALUM & MAG HYDROXIDE-SIMETH 200-200-20 MG/5ML PO SUSP: 30.0000 mL | ORAL | Status: DC | PRN

## 2016-04-14 NOTE — Progress Notes (Signed)
PROGRESS NOTE    Francisco Beard  ZOX:[id number] DOB: 10-28-1960 DOA: 04/13/2016 PCP: [name], MD    Brief Narrative:  55 yo male with COPD, presents with 4 days of worsening dyspnea associated with nausea and vomiting, unable to eat. Admitted for copd exacerbation and intractable nausea and vomiting.    Assessment & Plan:   Active Problems:   COPD exacerbation (HCC)   COPD (chronic obstructive pulmonary disease) (HCC)  1. COPD exacerbation. Will continue systemic steroids with methylprednisolone 40 mg IV q12 hours, will continue oxymetry monitoring and supplemental 02 per [place]. Chest film personally reviewed noted hyperinflation but no infiltrates. Continue bronchodilator therapy with  Albuterol, ipratropium, xopenex. Inhaled corticosteroids with momentasone and long acting b agonist with formoterol. Will continue theophylline and will dc tiotropium.    2. Dyspepsia with intractable nausea and vomiting. Will continue antiemetics and antiacid therapy, will change diet to clear liquids. GI cocktail x1 and sucralfate.  3. HTN. Continue blood pressure monitoring, continue amlodipine, systolic blood pressure 120 to 140 systolic.   4. Dyslipidemia. Continue statin therapy with atorvastatin.   DVT prophylaxis: enoxaparin  Code Status: full  Family Communication: No family at the bedside  Disposition Plan: home .   Consultants:     Procedures:    Antimicrobials:     Subjective: Patient reports nausea and vomiting, moderate to severe in intensity, unable to eat, associated with dyspnea, no significant cough or increase sputum production. No fever or chills.   Objective: Vitals:   04/13/16 1925 04/14/16 0011 04/14/16 0413 04/14/16 0750  BP: (!) 142/81 113/63 127/77   Pulse: (!) 59 62 78   Resp: 16 18 20    Temp: 97.7 F (36.5 C) 98.1 F (36.7 C) 97.8 F (36.6 C)   TempSrc: Oral Oral Oral   SpO2: 100% 100% 100% 95%  Weight: 74 kg (163 lb 1.6 oz)  71.4 kg (157 lb 6.4 oz)    Height: 5\' 11"  (1.803 m)       Intake/Output Summary (Last 24 hours) at 04/14/16 0833 Last data filed at 04/14/16 0414  Gross per 24 hour  Intake                0 ml  Output              250 ml  Net             -250 ml   Filed Weights   04/13/16 1315 04/13/16 1925 04/14/16 0413  Weight: 81.6 kg (180 lb) 74 kg (163 lb 1.6 oz) 71.4 kg (157 lb 6.4 oz)    Examination:  General exam: in pain, deconditioned and ill looking appearing E ENT: mild pallor, but no icterus, oral mucosa dry. Respiratory system: Mild decrease ventilation, but no wheezing, rales or rhonchi. Respiratory effort normal. Cardiovascular system: S1 & S2 heard, RRR. No JVD, murmurs, rubs, gallops or clicks. No pedal edema. Gastrointestinal system: Abdomen is nondistended, soft and nontender. No organomegaly or masses felt. Normal bowel sounds heard. Central nervous system: Alert and oriented. No focal neurological deficits. Extremities: Symmetric 5 x 5 power. Skin: No rashes, lesions or ulcers .     Data Reviewed: I have personally reviewed following labs and imaging studies  CBC:  Recent Labs Lab 04/13/16 1355  WBC 5.6  HGB 15.4  HCT 46.5  MCV 84.1  PLT 232   Basic Metabolic Panel:  Recent Labs Lab 04/13/16 1355 04/14/16 0400  NA 139 138  K 3.7 4.7  CL 108  109  CO2 23 17*  GLUCOSE 95 132*  BUN 11 13  CREATININE 1.19 1.08  CALCIUM 9.6 9.3   GFR: Estimated Creatinine Clearance: 76.2 mL/min (by C-G formula based on SCr of 1.08 mg/dL). Liver Function Tests: No results for input(s): AST, ALT, ALKPHOS, BILITOT, PROT, ALBUMIN in the last 168 hours. No results for input(s): LIPASE, AMYLASE in the last 168 hours. No results for input(s): AMMONIA in the last 168 hours. Coagulation Profile: No results for input(s): INR, PROTIME in the last 168 hours. Cardiac Enzymes:  Recent Labs Lab 04/13/16 2306 04/14/16 0400  TROPONINI <0.03 <0.03   BNP (last 3 results) No results for input(s): PROBNP  in the last 8760 hours. HbA1C: No results for input(s): HGBA1C in the last 72 hours. CBG: No results for input(s): GLUCAP in the last 168 hours. Lipid Profile: No results for input(s): CHOL, HDL, LDLCALC, TRIG, CHOLHDL, LDLDIRECT in the last 72 hours. Thyroid Function Tests: No results for input(s): TSH, T4TOTAL, FREET4, T3FREE, THYROIDAB in the last 72 hours. Anemia Panel: No results for input(s): VITAMINB12, FOLATE, FERRITIN, TIBC, IRON, RETICCTPCT in the last 72 hours. Sepsis Labs: No results for input(s): PROCALCITON, LATICACIDVEN in the last 168 hours.  No results found for this or any previous visit (from the past 240 hour(s)).       Radiology Studies: Dg Chest 2 View  Result Date: 04/13/2016 CLINICAL DATA:  COPD, chest pain and shortness of breath EXAM: CHEST  2 VIEW COMPARISON:  04/01/2016 FINDINGS: Background bullous emphysema with apical blebs, worse on the right. Slightly lower lung volumes with bibasilar atelectasis. Normal heart size and vascularity. No focal pneumonia, collapse or consolidation. Negative for edema, effusion or pneumothorax. Trachea is midline. Minor thoracic endplate degenerative changes. IMPRESSION: Chronic background bullous emphysema. lower lung volumes with bibasilar atelectasis. Electronically Signed   By: Judie PetitM.  Shick M.D.   On: 04/13/2016 14:08   Ct Head Wo Contrast  Result Date: 04/13/2016 CLINICAL DATA:  Patient with cough, chest pain and shortness of breath. Unresponsive. Elevated D-dimer. EXAM: CT HEAD WITHOUT CONTRAST TECHNIQUE: Contiguous axial images were obtained from the base of the skull through the vertex without intravenous contrast. COMPARISON:  Brain CT 03/12/2016. FINDINGS: Brain: Ventricles and sulci are appropriate for patient's age. No evidence for acute cortically based infarct, intracranial hemorrhage, mass lesion or mass-effect. Vascular: Unremarkable. Skull: Intact. Sinuses/Orbits: Mucosal thickening and fluid within the left  maxillary sinus. Ethmoid air cell mucosal thickening. Mastoid air cells unremarkable. Other: None. IMPRESSION: No acute intracranial process. Mucosal thickening and fluid within the left maxillary sinus and ethmoid air cells. Electronically Signed   By: Annia Beltrew  Davis M.D.   On: 04/13/2016 16:49   Ct Angio Chest Pe W And/or Wo Contrast  Result Date: 04/13/2016 CLINICAL DATA:  Chest pain and coughing.  Shortness of breath. EXAM: CT ANGIOGRAPHY CHEST WITH CONTRAST TECHNIQUE: Multidetector CT imaging of the chest was performed using the standard protocol during bolus administration of intravenous contrast. Multiplanar CT image reconstructions and MIPs were obtained to evaluate the vascular anatomy. CONTRAST:  100 cc of Isovue 370 COMPARISON:  Chest x-ray from earlier today and CT scan September 15, 2015 FINDINGS: Cardiovascular: Coronary artery calcifications are identified. The thoracic aorta is normal in caliber with no dissection. The heart is unchanged. Evaluation for emboli in the bases is limited due to respiratory motion. There is significant resulting stairstep artifact which limits evaluation for emboli beyond the segmental level. A filling defect seen in a left lower lobe pulmonary artery  on series 15, image 173 is favored to be artifactual, given the extensive artifact in this region and the lack of filling defects seen elsewhere. Mediastinum/Nodes: There is a small hiatal hernia. No adenopathy. No effusions. Lungs/Pleura: Central airways are normal. No pneumothorax. Bullous changes are seen in the lung apices, right greater than left. Emphysematous changes identified. In the left upper lobe. A nodule in the right mid lung on series 16, image 52, abutting the fissure, measures 6 mm on axial images. On coronal images, this appears to represent some atelectasis with adjacent thickening of the fissure. The finding is not particularly nodular on the sagittal image either. Mild atelectasis in the right upper lung  laterally. There is a ground-glass nodule in the left apex which is ill defined measuring up to 10 mm. This has been present since March of 2014. No other nodules. No masses or infiltrates. Upper Abdomen: No acute abnormality. Musculoskeletal: No chest wall abnormality. No acute or significant osseous findings. Review of the MIP images confirms the above findings. IMPRESSION: 1. Evaluation for pulmonary emboli is limited in the lung bases due to significant respiratory motion. No definitive pulmonary emboli identified. A filling defect in the left lower lobe as described above is felt to be artifactual rather than a single embolus. No central emboli are identified. If there is continued concern, a V/Q scan could further evaluate. 2. There is a ground-glass nodule in the left upper lobe. This has been present since 2014. It is more prominent today, possibly due to difference in slice selection. Recommend attention on a follow-up chest CT in 3 months. 3. 6 mm nodule in the right lung base. Based on coronal and sagittal images, this is felt to likely represent atelectasis and adjacent pleural thickening. Recommend attention on follow-up. Electronically Signed   By: Gerome Samavid  Williams III M.D   On: 04/13/2016 17:03        Scheduled Meds: . amLODipine  5 mg Oral Daily  . atorvastatin  20 mg Oral Daily  . diclofenac sodium  4 g Topical QID  . DULoxetine  30 mg Oral Daily  . enoxaparin (LOVENOX) injection  40 mg Subcutaneous Q24H  . folic acid  1 mg Oral Daily  . guaiFENesin  600 mg Oral BID  . ipratropium  0.5 mg Nebulization Q6H  . levalbuterol  0.63 mg Nebulization Q6H  . methylPREDNISolone (SOLU-MEDROL) injection  40 mg Intravenous Q12H  . mometasone-formoterol  1 puff Inhalation BID  . montelukast  10 mg Oral QHS  . pantoprazole  40 mg Oral BID  . sodium chloride flush  3 mL Intravenous Q12H  . sucralfate  1 g Oral TID WC & HS  . theophylline  300 mg Oral Daily  . tiotropium  18 mcg Inhalation  Daily   Continuous Infusions:   LOS: 1 day       Nyellie Yetter Annett Gulaaniel Vondell Babers, MD [facility] Pager [phone]  If 7PM-7AM, please contact night-coverage [email or web address] Password [email or web address] 04/14/2016, 8:33 AM

## 2016-04-14 NOTE — Progress Notes (Signed)
Patient has been sleeping on and off for most of the day. His only complaint is the pain in his chest which radiates up from his stomach. He does have a history of a hiatal hernia according to the patient. No vomiting this shift, PRN pain and nausea medication given once. Vital signs are stable, No questions or concerns at this time. Call bell in reach, will continue to monitor.

## 2016-04-14 NOTE — Progress Notes (Signed)
Patient had no further episodes of emesis. Patient still states feeling nauseous after eating or drinking.   Petra Kubarica Milina Pagett RN 04/14/2016 6:52 AM

## 2016-04-14 NOTE — Progress Notes (Signed)
Patient complaining of chest pain that that burns from mid sternum to abdomen. When asked about any previous history or a scope he stated that they had done a scope many years ago and he was told he had a hiatal hernia. RN has instructed the patient to remain at a 45 degree angle or better after eating or drinking for at least 30 minutes to see if the problem improves. Pain medication has been given.

## 2016-04-15 ENCOUNTER — Inpatient Hospital Stay (HOSPITAL_COMMUNITY): Payer: Medicaid Other

## 2016-04-15 DIAGNOSIS — K2970 Gastritis, unspecified, without bleeding: Secondary | ICD-10-CM

## 2016-04-15 DIAGNOSIS — K2990 Gastroduodenitis, unspecified, without bleeding: Secondary | ICD-10-CM

## 2016-04-15 LAB — CBC WITH DIFFERENTIAL/PLATELET
BASOS ABS: 0 10*3/uL (ref 0.0–0.1)
Basophils Relative: 0 %
EOS ABS: 0 10*3/uL (ref 0.0–0.7)
Eosinophils Relative: 0 %
HCT: 44 % (ref 39.0–52.0)
HEMOGLOBIN: 14.5 g/dL (ref 13.0–17.0)
LYMPHS ABS: 0.6 10*3/uL — AB (ref 0.7–4.0)
LYMPHS PCT: 7 %
MCH: 27.4 pg (ref 26.0–34.0)
MCHC: 33 g/dL (ref 30.0–36.0)
MCV: 83.2 fL (ref 78.0–100.0)
Monocytes Absolute: 0.3 10*3/uL (ref 0.1–1.0)
Monocytes Relative: 4 %
NEUTROS PCT: 89 %
Neutro Abs: 7.5 10*3/uL (ref 1.7–7.7)
Platelets: 283 10*3/uL (ref 150–400)
RBC: 5.29 MIL/uL (ref 4.22–5.81)
RDW: 15.1 % (ref 11.5–15.5)
WBC: 8.5 10*3/uL (ref 4.0–10.5)

## 2016-04-15 LAB — BASIC METABOLIC PANEL
ANION GAP: 9 (ref 5–15)
BUN: 17 mg/dL (ref 6–20)
CHLORIDE: 104 mmol/L (ref 101–111)
CO2: 21 mmol/L — ABNORMAL LOW (ref 22–32)
Calcium: 9.5 mg/dL (ref 8.9–10.3)
Creatinine, Ser: 1.14 mg/dL (ref 0.61–1.24)
GFR calc Af Amer: >60 mL/min (ref >60)
Glucose, Bld: 177 mg/dL — ABNORMAL HIGH (ref 65–99)
POTASSIUM: 4.1 mmol/L (ref 3.5–5.1)
SODIUM: 134 mmol/L — AB (ref 135–145)

## 2016-04-15 MED ORDER — SODIUM CHLORIDE 0.9 % IV SOLN
INTRAVENOUS | Status: DC
  Administered 2016-04-15: 11:00:00 via INTRAVENOUS
  Administered 2016-04-16: 75 mL/h via INTRAVENOUS
  Administered 2016-04-16 – 2016-04-17 (×2): via INTRAVENOUS

## 2016-04-15 MED ORDER — SENNOSIDES-DOCUSATE SODIUM 8.6-50 MG PO TABS
2.0000 | ORAL_TABLET | Freq: Every evening | ORAL | Status: DC | PRN
  Administered 2016-04-15: 2 via ORAL
  Filled 2016-04-15 (×2): qty 2

## 2016-04-15 MED ORDER — PREDNISONE 20 MG PO TABS
20.0000 mg | ORAL_TABLET | Freq: Every day | ORAL | Status: DC
  Administered 2016-04-16 – 2016-04-17 (×2): 20 mg via ORAL
  Filled 2016-04-15 (×3): qty 1

## 2016-04-15 MED ORDER — IOPAMIDOL (ISOVUE-300) INJECTION 61%
INTRAVENOUS | Status: AC
  Filled 2016-04-15: qty 30

## 2016-04-15 MED ORDER — IOPAMIDOL (ISOVUE-300) INJECTION 61%
INTRAVENOUS | Status: AC
  Administered 2016-04-15: 100 mL
  Filled 2016-04-15: qty 100

## 2016-04-15 NOTE — Progress Notes (Signed)
PROGRESS NOTE    Francisco AsalJohn Beard  ZOX:[id number] DOB: [birth year] DOA: 04/13/2016 PCP: [name], [name], MD   Brief Narrative:  55 yo male with COPD, presents with 4 days of worsening dyspnea associated with nausea and vomiting, unable to eat. Admitted for copd exacerbation and intractable nausea and vomiting.    Assessment & Plan:   Active Problems:   COPD exacerbation (HCC)   COPD (chronic obstructive pulmonary disease) (HCC)  1. COPD exacerbation.  Continue bronchodilator therapy with  Albuterol, ipratropium, xopenex. Inhaled corticosteroids with momentasone and long acting b agonist with formoterol. Will continue theophylline. Will decease steroids, no significant wheezing. Risk of steroid induced gastritis.   2. Dyspepsia with intractable nausea and vomiting. Patient with severe abdominal pain, has been refractive to antiacid therapy, unable to tolerate po intake. Will continue protonix and sucralfate. CT abdomen and pelvis with gallbladder sludge or stones, no colitis, or perforation. Will follow with abdominal US.   3. HTN. Continue amlodipine, systolic blood pressure 120 , with poor oral intake.   4. Dyslipidemia. Continue atorvastatin.   DVT prophylaxis: enoxaparin  Code Status: full  Family Communication: No family at the bedside  Disposition Plan: home .   Consultants:     Procedures:    Antimicrobials:     Subjective: Patient with severe abdominal pain, has not been able to eat clears due to sever pain, nausea and vomiting. No bowel movement and reports not passing gas. No chest pain. Dyspnea has improved.   Objective: Vitals:   04/14/16 2300 04/15/16 0110 04/15/16 0536 04/15/16 0745  BP: (!) 106/57  114/68   Pulse: 73  69   Resp: 18  18   Temp: 99 F (37.2 C)  98.3 F (36.8 C)   TempSrc: Oral  Oral   SpO2: 96% 98% 97% 97%  Weight:   71.8 kg (158 lb 4.8 oz)   Height:        Intake/Output Summary (Last 24 hours) at 04/15/16 0844 Last data filed  at 04/15/16 0132  Gross per 24 hour  Intake              720 ml  Output              750 ml  Net              -30 ml   Filed Weights   04/13/16 1925 04/14/16 0413 04/15/16 0536  Weight: 74 kg (163 lb 1.6 oz) 71.4 kg (157 lb 6.4 oz) 71.8 kg (158 lb 4.8 oz)    Examination:  General exam: In pain, deconditioned, ill looking appearing E ENT: mild pallor, no icterus, oral mucosa dry. Respiratory system: Positive bilateral rhonchi, no wheezing or rales.  Cardiovascular system: S1 & S2 heard, RRR. No JVD, murmurs, rubs, gallops or clicks. No pedal edema. Gastrointestinal system: Abdomen is nondistended, soft, but tender to deep palpation, all quadrants, more on the left lower, no rebound or organomegaly. Decreased bowel sounds heard. Central nervous system: Alert and oriented. No focal neurological deficits. Extremities: Symmetric 5 x 5 power. Skin: No rashes, lesions or ulcers     Data Reviewed: I have personally reviewed following labs and imaging studies  CBC:  Recent Labs Lab 04/13/16 1355 04/14/16 1011 04/15/16 0253  WBC 5.6 5.9 8.5  NEUTROABS  --   --  7.5  HGB 15.4 15.5 14.5  HCT 46.5 46.2 44.0  MCV 84.1 83.1 83.2  PLT 232 249 283   Basic Metabolic Panel:  Recent Labs Lab  04/13/16 1355 04/14/16 0400 04/15/16 0253  NA 139 138 134*  K 3.7 4.7 4.1  CL 108 109 104  CO2 23 17* 21*  GLUCOSE 95 132* 177*  BUN 11 13 17   CREATININE 1.19 1.08 1.14  CALCIUM 9.6 9.3 9.5   GFR: Estimated Creatinine Clearance: 72.6 mL/min (by C-G formula based on SCr of 1.14 mg/dL). Liver Function Tests: No results for input(s): AST, ALT, ALKPHOS, BILITOT, PROT, ALBUMIN in the last 168 hours. No results for input(s): LIPASE, AMYLASE in the last 168 hours. No results for input(s): AMMONIA in the last 168 hours. Coagulation Profile: No results for input(s): INR, PROTIME in the last 168 hours. Cardiac Enzymes:  Recent Labs Lab 04/13/16 2306 04/14/16 0400 04/14/16 1011  04/14/16 1450  TROPONINI <0.03 <0.03 <0.03 <0.03   BNP (last 3 results) No results for input(s): PROBNP in the last 8760 hours. HbA1C: No results for input(s): HGBA1C in the last 72 hours. CBG: No results for input(s): GLUCAP in the last 168 hours. Lipid Profile: No results for input(s): CHOL, HDL, LDLCALC, TRIG, CHOLHDL, LDLDIRECT in the last 72 hours. Thyroid Function Tests: No results for input(s): TSH, T4TOTAL, FREET4, T3FREE, THYROIDAB in the last 72 hours. Anemia Panel: No results for input(s): VITAMINB12, FOLATE, FERRITIN, TIBC, IRON, RETICCTPCT in the last 72 hours. Sepsis Labs: No results for input(s): PROCALCITON, LATICACIDVEN in the last 168 hours.  No results found for this or any previous visit (from the past 240 hour(s)).       Radiology Studies: Dg Chest 2 View  Result Date: 04/13/2016 CLINICAL DATA:  COPD, chest pain and shortness of breath EXAM: CHEST  2 VIEW COMPARISON:  04/01/2016 FINDINGS: Background bullous emphysema with apical blebs, worse on the right. Slightly lower lung volumes with bibasilar atelectasis. Normal heart size and vascularity. No focal pneumonia, collapse or consolidation. Negative for edema, effusion or pneumothorax. Trachea is midline. Minor thoracic endplate degenerative changes. IMPRESSION: Chronic background bullous emphysema. lower lung volumes with bibasilar atelectasis. Electronically Signed   By: Judie Petit.  Shick M.D.   On: 04/13/2016 14:08   Ct Head Wo Contrast  Result Date: 04/13/2016 CLINICAL DATA:  Patient with cough, chest pain and shortness of breath. Unresponsive. Elevated D-dimer. EXAM: CT HEAD WITHOUT CONTRAST TECHNIQUE: Contiguous axial images were obtained from the base of the skull through the vertex without intravenous contrast. COMPARISON:  Brain CT 03/12/2016. FINDINGS: Brain: Ventricles and sulci are appropriate for patient's age. No evidence for acute cortically based infarct, intracranial hemorrhage, mass lesion or  mass-effect. Vascular: Unremarkable. Skull: Intact. Sinuses/Orbits: Mucosal thickening and fluid within the left maxillary sinus. Ethmoid air cell mucosal thickening. Mastoid air cells unremarkable. Other: None. IMPRESSION: No acute intracranial process. Mucosal thickening and fluid within the left maxillary sinus and ethmoid air cells. Electronically Signed   By: Annia Belt M.D.   On: 04/13/2016 16:49   Ct Angio Chest Pe W And/or Wo Contrast  Result Date: 04/13/2016 CLINICAL DATA:  Chest pain and coughing.  Shortness of breath. EXAM: CT ANGIOGRAPHY CHEST WITH CONTRAST TECHNIQUE: Multidetector CT imaging of the chest was performed using the standard protocol during bolus administration of intravenous contrast. Multiplanar CT image reconstructions and MIPs were obtained to evaluate the vascular anatomy. CONTRAST:  100 cc of Isovue 370 COMPARISON:  Chest x-ray from earlier today and CT scan September 15, 2015 FINDINGS: Cardiovascular: Coronary artery calcifications are identified. The thoracic aorta is normal in caliber with no dissection. The heart is unchanged. Evaluation for emboli in the bases is  limited due to respiratory motion. There is significant resulting stairstep artifact which limits evaluation for emboli beyond the segmental level. A filling defect seen in a left lower lobe pulmonary artery on series 15, image 173 is favored to be artifactual, given the extensive artifact in this region and the lack of filling defects seen elsewhere. Mediastinum/Nodes: There is a small hiatal hernia. No adenopathy. No effusions. Lungs/Pleura: Central airways are normal. No pneumothorax. Bullous changes are seen in the lung apices, right greater than left. Emphysematous changes identified. In the left upper lobe. A nodule in the right mid lung on series 16, image 52, abutting the fissure, measures 6 mm on axial images. On coronal images, this appears to represent some atelectasis with adjacent thickening of the fissure.  The finding is not particularly nodular on the sagittal image either. Mild atelectasis in the right upper lung laterally. There is a ground-glass nodule in the left apex which is ill defined measuring up to 10 mm. This has been present since March of 2014. No other nodules. No masses or infiltrates. Upper Abdomen: No acute abnormality. Musculoskeletal: No chest wall abnormality. No acute or significant osseous findings. Review of the MIP images confirms the above findings. IMPRESSION: 1. Evaluation for pulmonary emboli is limited in the lung bases due to significant respiratory motion. No definitive pulmonary emboli identified. A filling defect in the left lower lobe as described above is felt to be artifactual rather than a single embolus. No central emboli are identified. If there is continued concern, a V/Q scan could further evaluate. 2. There is a ground-glass nodule in the left upper lobe. This has been present since 2014. It is more prominent today, possibly due to difference in slice selection. Recommend attention on a follow-up chest CT in 3 months. 3. 6 mm nodule in the right lung base. Based on coronal and sagittal images, this is felt to likely represent atelectasis and adjacent pleural thickening. Recommend attention on follow-up. Electronically Signed   By: Gerome Samavid  Williams III M.D   On: 04/13/2016 17:03        Scheduled Meds: . amLODipine  5 mg Oral Daily  . atorvastatin  20 mg Oral Daily  . diclofenac sodium  4 g Topical QID  . DULoxetine  30 mg Oral Daily  . enoxaparin (LOVENOX) injection  40 mg Subcutaneous Q24H  . folic acid  1 mg Oral Daily  . guaiFENesin  600 mg Oral BID  . ipratropium  0.5 mg Nebulization Q6H  . levalbuterol  0.63 mg Nebulization Q6H  . methylPREDNISolone (SOLU-MEDROL) injection  40 mg Intravenous Q12H  . mometasone-formoterol  1 puff Inhalation BID  . montelukast  10 mg Oral QHS  . pantoprazole  40 mg Oral BID  . sodium chloride flush  3 mL Intravenous Q12H   . sucralfate  1 g Oral TID WC & HS  . theophylline  300 mg Oral Daily   Continuous Infusions:   LOS: 2 days       Francisco Beard Annett Gulaaniel Marijean Montanye, MD [facility] Pager [phone]  If 7PM-7AM, please contact night-coverage [email or web address] Password [email or web address] 04/15/2016, 8:44 AM

## 2016-04-16 ENCOUNTER — Inpatient Hospital Stay (HOSPITAL_COMMUNITY): Payer: Medicaid Other

## 2016-04-16 LAB — BASIC METABOLIC PANEL
Anion gap: 6 (ref 5–15)
BUN: 14 mg/dL (ref 6–20)
CHLORIDE: 103 mmol/L (ref 101–111)
CO2: 24 mmol/L (ref 22–32)
CREATININE: 1.05 mg/dL (ref 0.61–1.24)
Calcium: 9 mg/dL (ref 8.9–10.3)
GFR calc non Af Amer: >60 mL/min (ref >60)
Glucose, Bld: 91 mg/dL (ref 65–99)
Potassium: 3.6 mmol/L (ref 3.5–5.1)
SODIUM: 133 mmol/L — AB (ref 135–145)

## 2016-04-16 LAB — CBC WITH DIFFERENTIAL/PLATELET
BASOS PCT: 0 %
Basophils Absolute: 0 10*3/uL (ref 0.0–0.1)
EOS ABS: 0 10*3/uL (ref 0.0–0.7)
Eosinophils Relative: 0 %
HCT: 42 % (ref 39.0–52.0)
HEMOGLOBIN: 14.1 g/dL (ref 13.0–17.0)
Lymphocytes Relative: 22 %
Lymphs Abs: 1.3 10*3/uL (ref 0.7–4.0)
MCH: 27.6 pg (ref 26.0–34.0)
MCHC: 33.6 g/dL (ref 30.0–36.0)
MCV: 82.4 fL (ref 78.0–100.0)
Monocytes Absolute: 0.8 10*3/uL (ref 0.1–1.0)
Monocytes Relative: 14 %
NEUTROS PCT: 64 %
Neutro Abs: 3.9 10*3/uL (ref 1.7–7.7)
Platelets: 254 10*3/uL (ref 150–400)
RBC: 5.1 MIL/uL (ref 4.22–5.81)
RDW: 14.5 % (ref 11.5–15.5)
WBC: 6 10*3/uL (ref 4.0–10.5)

## 2016-04-16 MED ORDER — LEVALBUTEROL HCL 0.63 MG/3ML IN NEBU
0.6300 mg | INHALATION_SOLUTION | Freq: Three times a day (TID) | RESPIRATORY_TRACT | Status: DC
  Administered 2016-04-17: 0.63 mg via RESPIRATORY_TRACT
  Filled 2016-04-16: qty 3

## 2016-04-16 MED ORDER — IPRATROPIUM BROMIDE 0.02 % IN SOLN
0.5000 mg | Freq: Three times a day (TID) | RESPIRATORY_TRACT | Status: DC
  Administered 2016-04-17: 0.5 mg via RESPIRATORY_TRACT
  Filled 2016-04-16: qty 2.5

## 2016-04-16 MED ORDER — POTASSIUM CHLORIDE 20 MEQ PO PACK
40.0000 meq | PACK | Freq: Once | ORAL | Status: AC
  Administered 2016-04-16: 40 meq via ORAL
  Filled 2016-04-16 (×2): qty 2

## 2016-04-16 NOTE — Progress Notes (Signed)
Patient remains NPO for US of RUQ.  I contacted US department to confirm test is still in progress and patient to remain NPO and they stated to keep him NPO except sips water.  Patient updated on status.

## 2016-04-16 NOTE — Progress Notes (Signed)
Patient fell at home week for admission to hospital and his fall score is HIGH.  He has on yellow socks and armband and had had on bed alarm today.  Stated he wants alarm turned off - alarm off.  Instructed to call for assistance with ambulation.  Call bell within reach.

## 2016-04-16 NOTE — Progress Notes (Signed)
PROGRESS NOTE    Francisco Beard  ZOX:[id number] DOB: [birth year] DOA: 04/13/2016 PCP: [name], [name], MD   Brief Narrative:  55 yo male with COPD, presents with 4 days of worsening dyspnea associated with nausea and vomiting, unable to eat. Admitted for copd exacerbation and intractable nausea and vomiting.   Assessment & Plan:   Active Problems:   COPD exacerbation (HCC)   COPD (chronic obstructive pulmonary disease) (HCC)    1. COPD exacerbation. Patient responding well to medical therapy with bronchodilator,Albuterol, ipratropium, xopenex. Inhaled corticosteroids with momentasone and long acting b agonist with formoterol. Continue theophylline. Decreased dose steroids with plan for a rapid taper.   2. Dyspepsia with intractable nausea and vomiting. Improved abdominal pain, continue clear liquid diet, antiacid therapy with protonix and sucralfate. CT abdomen and pelvis with gallbladder sludge or stones, no colitis, or perforation but follow US with normal gallbladder and no stones. Will advance diet as tolerated.   3. HTN.  Blood pressure stable continue amlodipine. Systolic blood pressure in the low 120's.   4. Dyslipidemia. Continue statin therapy with  atorvastatin.   DVT prophylaxis:enoxaparin  Code Status:full  Family Communication:No family at the bedside  Disposition Plan:home .   Consultants:    Procedures:   Antimicrobials:     Subjective: Patient feeling better, dyspnea has improved, no chest pain. Nausea and vomiting have improved, but still with abdominal pain, epigastric, burning in nature, with radiation in the chest.   Objective: Vitals:   04/15/16 2114 04/16/16 0220 04/16/16 0524 04/16/16 0720  BP: 126/77  119/66   Pulse: 62  64   Resp: 18  17   Temp: 98.7 F (37.1 C)  98.4 F (36.9 C)   TempSrc: Oral  Oral   SpO2: 98% 98% 99% 96%  Weight:   69 kg (152 lb 1.6 oz)   Height:        Intake/Output Summary (Last 24 hours) at  04/16/16 0835 Last data filed at 04/16/16 0830  Gross per 24 hour  Intake             2398 ml  Output             1525 ml  Net              873 ml   Filed Weights   04/14/16 0413 04/15/16 0536 04/16/16 0524  Weight: 71.4 kg (157 lb 6.4 oz) 71.8 kg (158 lb 4.8 oz) 69 kg (152 lb 1.6 oz)    Examination:  General exam: not in pain or dyspnea E ENT: no pallor or icterus, oral mucosa dry. Respiratory system: No wheezing, rales or rhonchi.  Respiratory effort normal. Cardiovascular system: S1 & S2 heard, RRR. No JVD, murmurs, rubs, gallops or clicks. No pedal edema. Gastrointestinal system: Abdomen is nondistended, soft. Tender at the epigastric area to deep palpation, no rebound murphy sign negative.  No organomegaly or masses felt. Decreased  bowel sounds heard. Central nervous system: Alert and oriented. No focal neurological deficits. Extremities: Symmetric 5 x 5 power. Skin: No rashes, lesions or ulcers   Data Reviewed: I have personally reviewed following labs and imaging studies  CBC:  Recent Labs Lab 04/13/16 1355 04/14/16 1011 04/15/16 0253 04/16/16 0408  WBC 5.6 5.9 8.5 6.0  NEUTROABS  --   --  7.5 3.9  HGB 15.4 15.5 14.5 14.1  HCT 46.5 46.2 44.0 42.0  MCV 84.1 83.1 83.2 82.4  PLT 232 249 283 254   Basic Metabolic Panel:  Recent  Labs Lab 04/13/16 1355 04/14/16 0400 04/15/16 0253 04/16/16 0408  NA 139 138 134* 133*  K 3.7 4.7 4.1 3.6  CL 108 109 104 103  CO2 23 17* 21* 24  GLUCOSE 95 132* 177* 91  BUN 11 13 17 14   CREATININE 1.19 1.08 1.14 1.05  CALCIUM 9.6 9.3 9.5 9.0   GFR: Estimated Creatinine Clearance: 75.8 mL/min (by C-G formula based on SCr of 1.05 mg/dL). Liver Function Tests: No results for input(s): AST, ALT, ALKPHOS, BILITOT, PROT, ALBUMIN in the last 168 hours. No results for input(s): LIPASE, AMYLASE in the last 168 hours. No results for input(s): AMMONIA in the last 168 hours. Coagulation Profile: No results for input(s): INR, PROTIME  in the last 168 hours. Cardiac Enzymes:  Recent Labs Lab 04/13/16 2306 04/14/16 0400 04/14/16 1011 04/14/16 1450  TROPONINI <0.03 <0.03 <0.03 <0.03   BNP (last 3 results) No results for input(s): PROBNP in the last 8760 hours. HbA1C: No results for input(s): HGBA1C in the last 72 hours. CBG: No results for input(s): GLUCAP in the last 168 hours. Lipid Profile: No results for input(s): CHOL, HDL, LDLCALC, TRIG, CHOLHDL, LDLDIRECT in the last 72 hours. Thyroid Function Tests: No results for input(s): TSH, T4TOTAL, FREET4, T3FREE, THYROIDAB in the last 72 hours. Anemia Panel: No results for input(s): VITAMINB12, FOLATE, FERRITIN, TIBC, IRON, RETICCTPCT in the last 72 hours. Sepsis Labs: No results for input(s): PROCALCITON, LATICACIDVEN in the last 168 hours.  No results found for this or any previous visit (from the past 240 hour(s)).       Radiology Studies: Ct Abdomen Pelvis W Contrast  Result Date: 04/15/2016 CLINICAL DATA:  55 year old male with upper abdominal pain and nausea. Initial encounter. EXAM: CT ABDOMEN AND PELVIS WITH CONTRAST TECHNIQUE: Multidetector CT imaging of the abdomen and pelvis was performed using the standard protocol following bolus administration of intravenous contrast. CONTRAST:  100 mL ISOVUE-300 IOPAMIDOL (ISOVUE-300) INJECTION 61% COMPARISON:  CT Abdomen and Pelvis 11/14/2015 and earlier. FINDINGS: Lower chest: Improved lung volumes and bibasilar ventilation. Mild residual atelectasis or scarring in the right costophrenic angle. No pericardial or pleural effusion. No upper abdominal free air. Hepatobiliary: Stable liver with several small sub cm hypodense areas in the dome which appear benign (such as small cysts or biliary hamartoma is). There is dependent density in the gallbladder which could reflect sludge or small stones, but no pericholecystic inflammation. No biliary ductal enlargement. Pancreas: Negative pancreas. Spleen: Negative.  Adrenals/Urinary Tract: Negative adrenal glands. The right double-J ureteral stent has been removed. Bilateral renal enhancement and contrast excretion is normal. The bladder is more decompressed which might in part account for circumferential chronic bladder wall thickening. No definite perivesical stranding. Stomach/Bowel: Negative rectum. Negative sigmoid colon aside from redundancy. In the left lower quadrant beginning at the junction of the descending and sigmoid colon and continuing throughout the descending colon there is severe diverticulosis. No active inflammation is identified. The left colon is decompressed. The diverticulosis continues to the splenic flexure but relatively spares the transverse colon otherwise. Negative transverse and right colon aside from occasional diverticula and retained stool. Negative appendix (coronal image 57). Oral contrast has not reached the distal ileum. Small fecalith in the TI appears inconsequential (series 21, image 51). Small gastric hiatal hernia is unchanged. Otherwise negative stomach and duodenum. Vascular/Lymphatic: Major arterial structures are patent and negative aside from mild ectasia. Portal venous system is patent. New line no lymphadenopathy. Reproductive: Negative. Other: No pelvic free fluid. Musculoskeletal: Severe degenerative changes  at the right hip again noted. Stable visualized osseous structures. IMPRESSION: 1. Evidence of layering sludge or stones in the gallbladder but no CT evidence of acute cholecystitis. Recommend right upper quadrant ultrasound follow-up in this clinical setting. 2. Severe diverticulosis throughout the left colon but no active inflammation identified. Negative appendix. No bowel obstruction. Stable small gastric hiatal hernia. 3. Removed right double-J ureteral stent with no adverse features or obstructive uropathy. There is continued circumferential bladder wall thickening which is nonspecific. Electronically Signed   By: Odessa FlemingH   Hall M.D.   On: 04/15/2016 14:57        Scheduled Meds: . atorvastatin  20 mg Oral Daily  . diclofenac sodium  4 g Topical QID  . DULoxetine  30 mg Oral Daily  . enoxaparin (LOVENOX) injection  40 mg Subcutaneous Q24H  . folic acid  1 mg Oral Daily  . guaiFENesin  600 mg Oral BID  . ipratropium  0.5 mg Nebulization Q6H  . levalbuterol  0.63 mg Nebulization Q6H  . mometasone-formoterol  1 puff Inhalation BID  . montelukast  10 mg Oral QHS  . pantoprazole  40 mg Oral BID  . predniSONE  20 mg Oral Q breakfast  . sodium chloride flush  3 mL Intravenous Q12H  . sucralfate  1 g Oral TID WC & HS  . theophylline  300 mg Oral Daily   Continuous Infusions: . sodium chloride 75 mL/hr at 04/16/16 0506     LOS: 3 days       Mauricio Annett Gulaaniel Arrien, MD [facility] Pager [phone]  If 7PM-7AM, please contact night-coverage [email or web address] Password [email or web address] 04/16/2016, 8:35 AM

## 2016-04-16 NOTE — Plan of Care (Signed)
Problem: Nutrition: Goal: Adequate nutrition will be maintained Diet progressed from clear liquid to full liquid

## 2016-04-17 DIAGNOSIS — F319 Bipolar disorder, unspecified: Secondary | ICD-10-CM | POA: Diagnosis present

## 2016-04-17 DIAGNOSIS — J441 Chronic obstructive pulmonary disease with (acute) exacerbation: Principal | ICD-10-CM

## 2016-04-17 DIAGNOSIS — I10 Essential (primary) hypertension: Secondary | ICD-10-CM | POA: Diagnosis present

## 2016-04-17 DIAGNOSIS — R079 Chest pain, unspecified: Secondary | ICD-10-CM

## 2016-04-17 LAB — BASIC METABOLIC PANEL
Anion gap: 7 (ref 5–15)
BUN: 12 mg/dL (ref 6–20)
CALCIUM: 8.9 mg/dL (ref 8.9–10.3)
CHLORIDE: 107 mmol/L (ref 101–111)
CO2: 23 mmol/L (ref 22–32)
CREATININE: 1.05 mg/dL (ref 0.61–1.24)
GFR calc non Af Amer: >60 mL/min (ref >60)
Glucose, Bld: 81 mg/dL (ref 65–99)
Potassium: 4.1 mmol/L (ref 3.5–5.1)
SODIUM: 137 mmol/L (ref 135–145)

## 2016-04-17 MED ORDER — HYDROCODONE-ACETAMINOPHEN 5-325 MG PO TABS: 1.0000 | ORAL_TABLET | Freq: Four times a day (QID) | ORAL | 0 refills | Status: DC | PRN

## 2016-04-17 MED ORDER — ALBUTEROL SULFATE (2.5 MG/3ML) 0.083% IN NEBU: 2.5000 mg | INHALATION_SOLUTION | RESPIRATORY_TRACT | 0 refills | Status: DC | PRN

## 2016-04-17 MED ORDER — LEVALBUTEROL HCL 0.63 MG/3ML IN NEBU: 0.6300 mg | INHALATION_SOLUTION | Freq: Two times a day (BID) | RESPIRATORY_TRACT | Status: DC

## 2016-04-17 MED ORDER — SENNOSIDES-DOCUSATE SODIUM 8.6-50 MG PO TABS: 2.0000 | ORAL_TABLET | Freq: Every day | ORAL | Status: DC

## 2016-04-17 MED ORDER — AMLODIPINE BESYLATE 5 MG PO TABS: 5.0000 mg | ORAL_TABLET | Freq: Every day | ORAL | 0 refills | Status: DC

## 2016-04-17 MED ORDER — ALBUTEROL SULFATE HFA 108 (90 BASE) MCG/ACT IN AERS: 1.0000 | INHALATION_SPRAY | RESPIRATORY_TRACT | 0 refills | Status: DC | PRN

## 2016-04-17 MED ORDER — PREDNISONE 20 MG PO TABS: 20.0000 mg | ORAL_TABLET | Freq: Every day | ORAL | 0 refills | Status: AC

## 2016-04-17 MED ORDER — IPRATROPIUM BROMIDE 0.02 % IN SOLN: 0.5000 mg | Freq: Two times a day (BID) | RESPIRATORY_TRACT | Status: DC

## 2016-04-17 NOTE — Progress Notes (Signed)
CM talked to patient about PCP; patient stated that he does not have a PCP due to his temper and he is Bipolar; [facility]; pt is agreeable to this plan; Alexis GoodellB Ziah Leandro RN,MHA,BSN [phone]

## 2016-04-17 NOTE — Progress Notes (Signed)
Patient has a plan of advancing diet as tolerated. Patient came in with n/v and is no longer experiencing those symptoms. Diet was advanced from clear liquid to full liquid. Patient said he was hungry, so I gave graham crackers. Tolerated well.

## 2016-04-17 NOTE — Discharge Summary (Signed)
Physician Discharge Summary  Francisco Beard ZOX:[id number] DOB: 1961-02-01 DOA: 04/13/2016  PCP: [name], MD  Admit date: 04/13/2016 Discharge date: 04/17/2016  Time spent: 65 minutes  Recommendations for Outpatient Follow-up:  1. Follow-up at [facility] on 04/23/2016. On follow-up patient blood pressure neeed to be reassessed. Patient's antihypertensives were held during the hospitalization the resumed 3 days post discharge. Patient will need repeat CT chest done in 3 months for follow-up on left upper lobe nodule. Patient will likely benefit from referral to pulmonary for PFTs and further evaluation of his COPD.   Discharge Diagnoses:  Principal Problem:   COPD exacerbation (HCC) Active Problems:   COPD (chronic obstructive pulmonary disease) (HCC)   Bipolar disorder (HCC)   HTN (hypertension)   Discharge Condition: Stable and improved  Diet recommendation: Regular  Filed Weights   04/15/16 0536 04/16/16 0524 04/17/16 0544  Weight: 71.8 kg (158 lb 4.8 oz) 69 kg (152 lb 1.6 oz) 69.6 kg (153 lb 6.4 oz)    History of present illness:  Per Dr Rica Koyanagi is a 55 y.o. male with known COPD, still smokes, recent COPD admission and just discharged recently. Patient presented to the ED with SOB, chest tightness, wheezing. Symptoms not improved with albuterol, symptoms were persistent and severe, worse with exertion, associated with productive cough of yellow sputum, no fevers, chills. Pt denied chest pain,no abd or urinary concerns.   ED Course:He was hemodynamically stable and with oxygen saturation 96% on 2 L oxygen via [place]. Blood work reviewed, stable. TEle bed requested.    Hospital Course:  #1 acute COPD exacerbation Patient presented with shortness of breath, chest tightness or wheezing felt to be likely an acute COPD exacerbation. She was placed on telemetry. Patient was placed on IV Solu-Medrol, scheduled bronchodilators, bronchodilators as needed as  well as oxygen. CT angiogram of the chest which was done was negative for pulmonary emboli. Cardiac enzymes which were ordered were negative. Patient was placed on IV Compazine as needed for nausea vomiting. Patient improved clinically and IV Solu-Medrol was titrated down and patient transitioned to oral steroids. Patient was also maintained on theophylline as well as inhaled corticosteroids with mo.metasone long-acting beta agonist with formoterol. Spiriva was discontinued. Patient improved clinically and was subsequently transitioned to oral prednisone which she tolerated. Patient prednisone was tapered down. By day of discharge patient had no further wheezing and was satting 97% on room air. Patient be discharged on a prednisone taper as well as dulera, spiriva, bronchodilators as needed. Patient will follow-up with PCP in outpatient setting.  #2 dyspepsia with intractable nausea vomiting Patient was noted to have nausea vomiting during the hospitalization. CT abdomen and pelvis which was done was initially concerning for layer running sludge or stones in the gallbladder however negative for acute cholecystitis. Severe diverticulosis left colon but no active inflammation. Patient was placed on PPI and sucralfate as well as antiemetics with clinical improvement. Right upper quadrant ultrasound which was done was unremarkable. Patient's diet was advanced which he tolerated and patient will be discharged home in stable and improved condition.  #3 left upper lobe lung nodule Noted on CT angiogram of the chest. Patient need follow-up outpatient CT scan in approximately 3 months.  #4 hypertension Patient's blood pressure was borderline and a such patient's Norvasc was held during the hospitalization. Norvasc will be resumed 3 days post discharge.  #5 hyperlipidemia Patient was maintained on a statin.   Procedures:  CT abdomen and pelvis 04/15/2016  CT angiogram  chest 04/13/2016  CT head  04/13/2016   right upper quadrant ultrasound  Consultations:  None  Discharge Exam: Vitals:   04/17/16 1045 04/17/16 1235  BP: 110/60 108/69  Pulse: 90 75  Resp:  20  Temp: 98.6 F (37 C) 99 F (37.2 C)    General: NAD Cardiovascular: RRR Respiratory: CTAB  Discharge Instructions   Discharge Instructions    Diet general    Complete by:  As directed    Increase activity slowly    Complete by:  As directed      Current Discharge Medication List    START taking these medications   Details  predniSONE (DELTASONE) 20 MG tablet Take 1 tablet (20 mg total) by mouth daily with breakfast. TAKE FOR 2 DAYS THEN STOP. Qty: 2 tablet, Refills: 0    senna-docusate (SENOKOT-S) 8.6-50 MG tablet Take 2 tablets by mouth at bedtime.      CONTINUE these medications which have CHANGED   Details  albuterol (PROVENTIL HFA;VENTOLIN HFA) 108 (90 Base) MCG/ACT inhaler Inhale 1-2 puffs into the lungs every 4 (four) hours as needed for wheezing or shortness of breath. Qty: 18 g, Refills: 0    albuterol (PROVENTIL) (2.5 MG/3ML) 0.083% nebulizer solution Take 3 mLs (2.5 mg total) by nebulization every 4 (four) hours as needed for wheezing or shortness of breath. Qty: 10 vial, Refills: 0    amLODipine (NORVASC) 5 MG tablet Take 1 tablet (5 mg total) by mouth daily. RESUME IN 3 DAYS. Qty: 30 tablet, Refills: 0    HYDROcodone-acetaminophen (NORCO/VICODIN) 5-325 MG tablet Take 1 tablet by mouth every 6 (six) hours as needed for moderate pain. Qty: 20 tablet, Refills: 0      CONTINUE these medications which have NOT CHANGED   Details  Ascorbic Acid (VITAMIN C PO) Take 1 tablet by mouth daily.    atorvastatin (LIPITOR) 20 MG tablet Take 20 mg by mouth daily.    Cholecalciferol (VITAMIN D3) 1000 units CAPS Take 1 capsule by mouth daily.    DULoxetine (CYMBALTA) 30 MG capsule Take 30 mg by mouth daily.    folic acid (FOLVITE) 1 MG tablet Take 1 mg by mouth daily.    loratadine  (CLARITIN) 10 MG tablet Take 10 mg by mouth daily as needed for allergies.    mometasone-formoterol (DULERA) 200-5 MCG/ACT AERO Inhale 1 puff into the lungs 2 (two) times daily. Qty: 1 Inhaler, Refills: 0    montelukast (SINGULAIR) 10 MG tablet Take 10 mg by mouth at bedtime.    pantoprazole (PROTONIX) 40 MG tablet Take 40 mg by mouth 2 (two) times daily.    sucralfate (CARAFATE) 1 g tablet Take 1 g by mouth 4 (four) times daily -  with meals and at bedtime.    theophylline (THEO-24) 300 MG 24 hr capsule Take 300 mg by mouth daily.    tiotropium (SPIRIVA HANDIHALER) 18 MCG inhalation capsule Place 1 capsule (18 mcg total) into inhaler and inhale daily. Qty: 30 capsule, Refills: 0    acetaminophen (TYLENOL) 650 MG CR tablet Take 650 mg by mouth every 8 (eight) hours as needed for pain.    diclofenac sodium (VOLTAREN) 1 % GEL Apply 4 g topically 4 (four) times daily. TO RIGHT HIP       Allergies  Allergen Reactions  . Strawberry Extract Anaphylaxis  . Tomato Anaphylaxis  . Aleve [Naproxen] Other (See Comments)    Heart problems so MD told him to not take  . Aspirin Swelling  .  Penicillins Swelling    Has patient had a PCN reaction causing immediate rash, facial/tongue/throat swelling, SOB or lightheadedness with hypotension: Yes Has patient had a PCN reaction causing severe rash involving mucus membranes or skin necrosis: Yes Has patient had a PCN reaction that required hospitalization: Yes Has patient had a PCN reaction occurring within the last 10 years: No If all of the above answers are "NO", then may proceed with Cephalosporin use.   . Seroquel [Quetiapine] Other (See Comments)    In higher doses, this causes excessive sedation  . Onion Nausea And Vomiting and Rash   Follow-up Information    [facility] Follow up on 04/23/2016.   Why:  At 9:30am for hospital follow up  Contact information: [address] [facility]  [id number] [phone]           The results of significant diagnostics from this hospitalization (including imaging, microbiology, ancillary and laboratory) are listed below for reference.    Significant Diagnostic Studies: Dg Chest 2 View  Result Date: 04/13/2016 CLINICAL DATA:  COPD, chest pain and shortness of breath EXAM: CHEST  2 VIEW COMPARISON:  04/01/2016 FINDINGS: Background bullous emphysema with apical blebs, worse on the right. Slightly lower lung volumes with bibasilar atelectasis. Normal heart size and vascularity. No focal pneumonia, collapse or consolidation. Negative for edema, effusion or pneumothorax. Trachea is midline. Minor thoracic endplate degenerative changes. IMPRESSION: Chronic background bullous emphysema. lower lung volumes with bibasilar atelectasis. Electronically Signed   By: Judie Petit.  Shick M.D.   On: 04/13/2016 14:08   Dg Chest 2 View  Result Date: 04/01/2016 CLINICAL DATA:  Chest pain, chest congestion and wheezing, 3 days duration. EXAM: CHEST  2 VIEW COMPARISON:  03/18/2016 and multiple previous FINDINGS: Heart size is normal. Mediastinal shadows are normal. There are areas of chronic pulmonary scarring in the perihilar lungs bilaterally. There is emphysematous change at both lung apices. No sign of active infiltrate, mass, effusion or collapse. Chronic degenerative changes affect the spine. IMPRESSION: Findings of chronic lung disease. No sign of active infiltrate, collapse or effusion. Electronically Signed   By: Paulina Fusi M.D.   On: 04/01/2016 14:38   Ct Head Wo Contrast  Result Date: 04/13/2016 CLINICAL DATA:  Patient with cough, chest pain and shortness of breath. Unresponsive. Elevated D-dimer. EXAM: CT HEAD WITHOUT CONTRAST TECHNIQUE: Contiguous axial images were obtained from the base of the skull through the vertex without intravenous contrast. COMPARISON:  Brain CT 03/12/2016. FINDINGS: Brain: Ventricles and sulci are appropriate for patient's  age. No evidence for acute cortically based infarct, intracranial hemorrhage, mass lesion or mass-effect. Vascular: Unremarkable. Skull: Intact. Sinuses/Orbits: Mucosal thickening and fluid within the left maxillary sinus. Ethmoid air cell mucosal thickening. Mastoid air cells unremarkable. Other: None. IMPRESSION: No acute intracranial process. Mucosal thickening and fluid within the left maxillary sinus and ethmoid air cells. Electronically Signed   By: Annia Belt M.D.   On: 04/13/2016 16:49   Ct Angio Chest Pe W And/or Wo Contrast  Result Date: 04/13/2016 CLINICAL DATA:  Chest pain and coughing.  Shortness of breath. EXAM: CT ANGIOGRAPHY CHEST WITH CONTRAST TECHNIQUE: Multidetector CT imaging of the chest was performed using the standard protocol during bolus administration of intravenous contrast. Multiplanar CT image reconstructions and MIPs were obtained to evaluate the vascular anatomy. CONTRAST:  100 cc of Isovue 370 COMPARISON:  Chest x-ray from earlier today and CT scan September 15, 2015 FINDINGS: Cardiovascular: Coronary artery calcifications are identified. The  thoracic aorta is normal in caliber with no dissection. The heart is unchanged. Evaluation for emboli in the bases is limited due to respiratory motion. There is significant resulting stairstep artifact which limits evaluation for emboli beyond the segmental level. A filling defect seen in a left lower lobe pulmonary artery on series 15, image 173 is favored to be artifactual, given the extensive artifact in this region and the lack of filling defects seen elsewhere. Mediastinum/Nodes: There is a small hiatal hernia. No adenopathy. No effusions. Lungs/Pleura: Central airways are normal. No pneumothorax. Bullous changes are seen in the lung apices, right greater than left. Emphysematous changes identified. In the left upper lobe. A nodule in the right mid lung on series 16, image 52, abutting the fissure, measures 6 mm on axial images. On  coronal images, this appears to represent some atelectasis with adjacent thickening of the fissure. The finding is not particularly nodular on the sagittal image either. Mild atelectasis in the right upper lung laterally. There is a ground-glass nodule in the left apex which is ill defined measuring up to 10 mm. This has been present since March of 2014. No other nodules. No masses or infiltrates. Upper Abdomen: No acute abnormality. Musculoskeletal: No chest wall abnormality. No acute or significant osseous findings. Review of the MIP images confirms the above findings. IMPRESSION: 1. Evaluation for pulmonary emboli is limited in the lung bases due to significant respiratory motion. No definitive pulmonary emboli identified. A filling defect in the left lower lobe as described above is felt to be artifactual rather than a single embolus. No central emboli are identified. If there is continued concern, a V/Q scan could further evaluate. 2. There is a ground-glass nodule in the left upper lobe. This has been present since 2014. It is more prominent today, possibly due to difference in slice selection. Recommend attention on a follow-up chest CT in 3 months. 3. 6 mm nodule in the right lung base. Based on coronal and sagittal images, this is felt to likely represent atelectasis and adjacent pleural thickening. Recommend attention on follow-up. Electronically Signed   By: Gerome Sam III M.D   On: 04/13/2016 17:03   Ct Abdomen Pelvis W Contrast  Result Date: 04/15/2016 CLINICAL DATA:  55 year old male with upper abdominal pain and nausea. Initial encounter. EXAM: CT ABDOMEN AND PELVIS WITH CONTRAST TECHNIQUE: Multidetector CT imaging of the abdomen and pelvis was performed using the standard protocol following bolus administration of intravenous contrast. CONTRAST:  100 mL ISOVUE-300 IOPAMIDOL (ISOVUE-300) INJECTION 61% COMPARISON:  CT Abdomen and Pelvis 11/14/2015 and earlier. FINDINGS: Lower chest: Improved  lung volumes and bibasilar ventilation. Mild residual atelectasis or scarring in the right costophrenic angle. No pericardial or pleural effusion. No upper abdominal free air. Hepatobiliary: Stable liver with several small sub cm hypodense areas in the dome which appear benign (such as small cysts or biliary hamartoma is). There is dependent density in the gallbladder which could reflect sludge or small stones, but no pericholecystic inflammation. No biliary ductal enlargement. Pancreas: Negative pancreas. Spleen: Negative. Adrenals/Urinary Tract: Negative adrenal glands. The right double-J ureteral stent has been removed. Bilateral renal enhancement and contrast excretion is normal. The bladder is more decompressed which might in part account for circumferential chronic bladder wall thickening. No definite perivesical stranding. Stomach/Bowel: Negative rectum. Negative sigmoid colon aside from redundancy. In the left lower quadrant beginning at the junction of the descending and sigmoid colon and continuing throughout the descending colon there is severe diverticulosis. No active inflammation  is identified. The left colon is decompressed. The diverticulosis continues to the splenic flexure but relatively spares the transverse colon otherwise. Negative transverse and right colon aside from occasional diverticula and retained stool. Negative appendix (coronal image 57). Oral contrast has not reached the distal ileum. Small fecalith in the TI appears inconsequential (series 21, image 51). Small gastric hiatal hernia is unchanged. Otherwise negative stomach and duodenum. Vascular/Lymphatic: Major arterial structures are patent and negative aside from mild ectasia. Portal venous system is patent. New line no lymphadenopathy. Reproductive: Negative. Other: No pelvic free fluid. Musculoskeletal: Severe degenerative changes at the right hip again noted. Stable visualized osseous structures. IMPRESSION: 1. Evidence of  layering sludge or stones in the gallbladder but no CT evidence of acute cholecystitis. Recommend right upper quadrant ultrasound follow-up in this clinical setting. 2. Severe diverticulosis throughout the left colon but no active inflammation identified. Negative appendix. No bowel obstruction. Stable small gastric hiatal hernia. 3. Removed right double-J ureteral stent with no adverse features or obstructive uropathy. There is continued circumferential bladder wall thickening which is nonspecific. Electronically Signed   By: Odessa Fleming M.D.   On: 04/15/2016 14:57   Dg Hip Unilat With Pelvis 2-3 Views Right  Result Date: 04/01/2016 CLINICAL DATA:  Larey Seat a week ago onto RIGHT hip, has been walking but with worsening hip pain, history of arthritis EXAM: DG HIP (WITH OR WITHOUT PELVIS) 2-3V RIGHT COMPARISON:  03/21/2015 FINDINGS: Osseous demineralization. Mixed lytic and sclerotic process involving the RIGHT femoral head with significant femoral head deformity. Findings are consistent with avascular necrosis of the RIGHT femoral head with chronic partial collapse of the femoral head. 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 No definite acute fracture, dislocation, or bone destruction. 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 No definite acute bony abnormalities. Electronically Signed   By: Ulyses Southward M.D.   On: 04/01/2016 17:57   US Abdomen Limited Ruq  Result Date: 04/16/2016 CLINICAL DATA:  55 year old male with right upper quadrant abdominal pain for 3 days. Suggestion of layering sludge or stones in the gallbladder on recent CT Abdomen and Pelvis. Initial encounter. EXAM: US ABDOMEN LIMITED - RIGHT UPPER QUADRANT COMPARISON:  CT Abdomen and Pelvis 04/15/2016 FINDINGS: Gallbladder: Normal. No gallstones or sludge. No wall thickening. No sonographic Murphy sign noted by  sonographer. Common bile duct: Diameter: 2 mm, normal Liver: No focal lesion identified. Within normal limits in parenchymal echogenicity. Other findings: Negative visible right kidney. IMPRESSION: Normal right upper quadrant ultrasound. The gallbladder is normal; the appearance of the gallbladder on the recent CT was artifactual. Electronically Signed   By: Odessa Fleming M.D.   On: 04/16/2016 15:32    Microbiology: No results found for this or any previous visit (from the past 240 hour(s)).   Labs: Basic Metabolic Panel:  Recent Labs Lab 04/13/16 1355 04/14/16 0400 04/15/16 0253 04/16/16 0408 04/17/16 0409  NA 139 138 134* 133* 137  K 3.7 4.7 4.1 3.6 4.1  CL 108 109 104 103 107  CO2 23 17* 21* 24 23  GLUCOSE 95 132* 177* 91 81  BUN 11 13 17 14 12   CREATININE 1.19 1.08 1.14 1.05 1.05  CALCIUM 9.6 9.3 9.5 9.0 8.9   Liver Function Tests: No results for input(s): AST, ALT, ALKPHOS, BILITOT, PROT, ALBUMIN in the last 168 hours. No results for input(s): LIPASE, AMYLASE in the last 168 hours. No results  for input(s): AMMONIA in the last 168 hours. CBC:  Recent Labs Lab 04/13/16 1355 04/14/16 1011 04/15/16 0253 04/16/16 0408  WBC 5.6 5.9 8.5 6.0  NEUTROABS  --   --  7.5 3.9  HGB 15.4 15.5 14.5 14.1  HCT 46.5 46.2 44.0 42.0  MCV 84.1 83.1 83.2 82.4  PLT 232 249 283 254   Cardiac Enzymes:  Recent Labs Lab 04/13/16 2306 04/14/16 0400 04/14/16 1011 04/14/16 1450  TROPONINI <0.03 <0.03 <0.03 <0.03   BNP: BNP (last 3 results)  Recent Labs  04/01/16 1412  BNP 43.2    ProBNP (last 3 results) No results for input(s): PROBNP in the last 8760 hours.  CBG: No results for input(s): GLUCAP in the last 168 hours.     SignedRamiro Harvest:  THOMPSON,DANIEL MD.  [facility] 04/17/2016, 1:01 PM

## 2016-04-17 NOTE — Progress Notes (Signed)
All d/c instructions explained and given to pt at 1400.  Verbalized understanding.  D/c off floor to awaiting transport.  Amanda PeaNellie Amontae Ng, Charity fundraiserN.

## 2016-04-23 ENCOUNTER — Inpatient Hospital Stay: Payer: Self-pay | Admitting: Internal Medicine

## 2016-05-25 ENCOUNTER — Emergency Department (HOSPITAL_BASED_OUTPATIENT_CLINIC_OR_DEPARTMENT_OTHER): Payer: Medicaid Other

## 2016-05-25 ENCOUNTER — Emergency Department (HOSPITAL_BASED_OUTPATIENT_CLINIC_OR_DEPARTMENT_OTHER)
Admission: EM | Admit: 2016-05-25 | Discharge: 2016-05-25 | Disposition: A | Discharge status: Home / Self Care | Payer: Medicaid Other | Attending: Emergency Medicine | Admitting: Emergency Medicine

## 2016-05-25 ENCOUNTER — Encounter (HOSPITAL_BASED_OUTPATIENT_CLINIC_OR_DEPARTMENT_OTHER): Payer: Self-pay | Admitting: Emergency Medicine

## 2016-05-25 DIAGNOSIS — J441 Chronic obstructive pulmonary disease with (acute) exacerbation: Secondary | ICD-10-CM | POA: Insufficient documentation

## 2016-05-25 DIAGNOSIS — R0602 Shortness of breath: Secondary | ICD-10-CM | POA: Diagnosis present

## 2016-05-25 DIAGNOSIS — J45909 Unspecified asthma, uncomplicated: Secondary | ICD-10-CM | POA: Insufficient documentation

## 2016-05-25 DIAGNOSIS — I110 Hypertensive heart disease with heart failure: Secondary | ICD-10-CM | POA: Insufficient documentation

## 2016-05-25 DIAGNOSIS — Z79899 Other long term (current) drug therapy: Secondary | ICD-10-CM | POA: Diagnosis not present

## 2016-05-25 DIAGNOSIS — F17200 Nicotine dependence, unspecified, uncomplicated: Secondary | ICD-10-CM | POA: Insufficient documentation

## 2016-05-25 DIAGNOSIS — I509 Heart failure, unspecified: Secondary | ICD-10-CM | POA: Insufficient documentation

## 2016-05-25 DIAGNOSIS — J383 Other diseases of vocal cords: Secondary | ICD-10-CM | POA: Insufficient documentation

## 2016-05-25 DIAGNOSIS — K219 Gastro-esophageal reflux disease without esophagitis: Secondary | ICD-10-CM | POA: Insufficient documentation

## 2016-05-25 LAB — CBC WITH DIFFERENTIAL/PLATELET
Basophils Absolute: 0 10*3/uL (ref 0.0–0.1)
Basophils Relative: 0 %
Eosinophils Absolute: 0.4 10*3/uL (ref 0.0–0.7)
Eosinophils Relative: 7 %
HEMATOCRIT: 41.5 % (ref 39.0–52.0)
Hemoglobin: 13.8 g/dL (ref 13.0–17.0)
LYMPHS PCT: 29 %
Lymphs Abs: 1.6 10*3/uL (ref 0.7–4.0)
MCH: 28.5 pg (ref 26.0–34.0)
MCHC: 33.3 g/dL (ref 30.0–36.0)
MCV: 85.6 fL (ref 78.0–100.0)
MONO ABS: 0.9 10*3/uL (ref 0.1–1.0)
MONOS PCT: 17 %
NEUTROS ABS: 2.5 10*3/uL (ref 1.7–7.7)
Neutrophils Relative %: 47 %
Platelets: 206 10*3/uL (ref 150–400)
RBC: 4.85 MIL/uL (ref 4.22–5.81)
RDW: 15.8 % — AB (ref 11.5–15.5)
WBC: 5.4 10*3/uL (ref 4.0–10.5)

## 2016-05-25 LAB — BASIC METABOLIC PANEL
ANION GAP: 8 (ref 5–15)
BUN: 17 mg/dL (ref 6–20)
CALCIUM: 9 mg/dL (ref 8.9–10.3)
CO2: 25 mmol/L (ref 22–32)
CREATININE: 1.1 mg/dL (ref 0.61–1.24)
Chloride: 108 mmol/L (ref 101–111)
GFR calc Af Amer: >60 mL/min (ref >60)
GFR calc non Af Amer: >60 mL/min (ref >60)
GLUCOSE: 69 mg/dL (ref 65–99)
Potassium: 4 mmol/L (ref 3.5–5.1)
Sodium: 141 mmol/L (ref 135–145)

## 2016-05-25 LAB — TROPONIN I: Troponin I: <0.03 ng/mL (ref <0.03)

## 2016-05-25 MED ORDER — OMEPRAZOLE 20 MG PO CPDR: 20.0000 mg | DELAYED_RELEASE_CAPSULE | Freq: Two times a day (BID) | ORAL | 0 refills | Status: DC

## 2016-05-25 NOTE — ED Notes (Signed)
Pt given d/c instructions as per chart. Rx  X 1. Verbalizes understanding. No questions.

## 2016-05-25 NOTE — ED Triage Notes (Signed)
Patient states that he is having pain to his chest and SOB x this am. The patient reports that cchest pain as well. [facility]

## 2016-05-25 NOTE — ED Notes (Signed)
ED Provider at bedside. 

## 2016-05-25 NOTE — ED Provider Notes (Signed)
[facility] Provider Note   CSN: [id number] Arrival date & time: 05/25/16  1450  By signing my name below, I, Francisco Beard, attest that this documentation has been prepared under the direction and in the presence of Lyndal Pulley, MD . Electronically Signed: Modena Beard, Scribe. 05/25/2016. 3:53 PM.  History   Chief Complaint Chief Complaint  Patient presents with  . Shortness of Breath   The history is provided by the patient. No language interpreter was used.   HPI Comments: Francisco Beard is a 55 y.o. male with a PMHx of COPD who presents to the Emergency Department complaining of constant moderate left-sided chest pain that started this morning. He states he about to eat when he had a sudden onset of stinging pain with 4-5 episodes of associated vomiting. He also states he has had associated SOB and nausea that started this morning. He denies any fevers or other complaints.     PCP: [name], MD  Past Medical History:  Diagnosis Date  . Asthma   . Bipolar disorder (HCC)   . CHF (congestive heart failure) (HCC)   . Chronic hip pain   . COPD (chronic obstructive pulmonary disease) (HCC)   . Depression   . Hernia   . Hypertension     Patient Active Problem List   Diagnosis Date Noted  . Bipolar disorder (HCC) 04/17/2016  . HTN (hypertension) 04/17/2016  . Chest pain   . COPD exacerbation (HCC) 04/13/2016  . COPD (chronic obstructive pulmonary disease) (HCC) 04/13/2016  . Avascular necrosis of bone of right hip (HCC) 04/08/2016  . COPD with acute exacerbation (HCC) 04/01/2016  . Suicidal ideations 08/17/2013    Past Surgical History:  Procedure Laterality Date  . HAND SURGERY    . shoulder surgery         Home Medications    Prior to Admission medications   Medication Sig Start Date End Date Taking? Authorizing Provider  acetaminophen (TYLENOL) 650 MG CR tablet Take 650 mg by mouth every 8 (eight) hours as needed for pain.    Historical  Provider, MD  albuterol (PROVENTIL HFA;VENTOLIN HFA) 108 (90 Base) MCG/ACT inhaler Inhale 1-2 puffs into the lungs every 4 (four) hours as needed for wheezing or shortness of breath. 04/17/16   Rodolph Bong, MD  albuterol (PROVENTIL) (2.5 MG/3ML) 0.083% nebulizer solution Take 3 mLs (2.5 mg total) by nebulization every 4 (four) hours as needed for wheezing or shortness of breath. 04/17/16   Rodolph Bong, MD  amLODipine (NORVASC) 5 MG tablet Take 1 tablet (5 mg total) by mouth daily. RESUME IN 3 DAYS. 04/20/16   Rodolph Bong, MD  Ascorbic Acid (VITAMIN C PO) Take 1 tablet by mouth daily.    Historical Provider, MD  atorvastatin (LIPITOR) 20 MG tablet Take 20 mg by mouth daily.    Historical Provider, MD  Cholecalciferol (VITAMIN D3) 1000 units CAPS Take 1 capsule by mouth daily.    Historical Provider, MD  diclofenac sodium (VOLTAREN) 1 % GEL Apply 4 g topically 4 (four) times daily. TO RIGHT HIP    Historical Provider, MD  DULoxetine (CYMBALTA) 30 MG capsule Take 30 mg by mouth daily.    Historical Provider, MD  folic acid (FOLVITE) 1 MG tablet Take 1 mg by mouth daily.    Historical Provider, MD  HYDROcodone-acetaminophen (NORCO/VICODIN) 5-325 MG tablet Take 1 tablet by mouth every 6 (six) hours as needed for moderate pain. 04/17/16   Rodolph Bong, MD  loratadine (CLARITIN) 10 MG tablet Take 10 mg by mouth daily as needed for allergies.    Historical Provider, MD  mometasone-formoterol (DULERA) 200-5 MCG/ACT AERO Inhale 1 puff into the lungs 2 (two) times daily. 04/04/16   Narda Bondsalph A Nettey, MD  montelukast (SINGULAIR) 10 MG tablet Take 10 mg by mouth at bedtime.    Historical Provider, MD  pantoprazole (PROTONIX) 40 MG tablet Take 40 mg by mouth 2 (two) times daily.    Historical Provider, MD  senna-docusate (SENOKOT-S) 8.6-50 MG tablet Take 2 tablets by mouth at bedtime. 04/17/16   Rodolph Bonganiel V Thompson, MD  sucralfate (CARAFATE) 1 g tablet Take 1 g by mouth 4 (four) times daily -   with meals and at bedtime.    Historical Provider, MD  theophylline (THEO-24) 300 MG 24 hr capsule Take 300 mg by mouth daily.    Historical Provider, MD  tiotropium (SPIRIVA HANDIHALER) 18 MCG inhalation capsule Place 1 capsule (18 mcg total) into inhaler and inhale daily. 04/05/16   Narda Bondsalph A Nettey, MD    Family History History reviewed. No pertinent family history.  Social History Social History  Substance Use Topics  . Smoking status: Current Every Day Smoker  . Smokeless tobacco: Never Used  . Alcohol use 17.2 oz/week    12 Cans of beer, 20 Standard drinks or equivalent per week     Allergies   Strawberry extract; Tomato; Aleve [naproxen]; Aspirin; Penicillins; Seroquel [quetiapine]; and Onion   Review of Systems Review of Systems  Constitutional: Negative for fever.  Respiratory: Positive for shortness of breath.   Cardiovascular: Positive for chest pain.  Gastrointestinal: Positive for nausea and vomiting.  All other systems reviewed and are negative.    Physical Exam Updated Vital Signs BP 112/66 (BP Location: Right Arm)   Pulse 65   Temp 97.6 F (36.4 C) (Oral)   Resp 24   Ht 5\' 11"  (1.803 m)   Wt 180 lb (81.6 kg)   SpO2 98%   BMI 25.10 kg/m   Physical Exam  Constitutional: He is oriented to person, place, and time. He appears well-developed and well-nourished. No distress.  HENT:  Head: Normocephalic and atraumatic.  Nose: Nose normal.  Eyes: Conjunctivae are normal.  Neck: Neck supple. No tracheal deviation present.  Cardiovascular: Normal rate and regular rhythm.   Pulmonary/Chest: Effort normal. No respiratory distress.  Faint inspiratory stridor.   Abdominal: Soft. He exhibits no distension.  Neurological: He is alert and oriented to person, place, and time.  Skin: Skin is warm and dry.  Psychiatric: He has a normal mood and affect.  Nursing note and vitals reviewed.    ED Treatments / Results  DIAGNOSTIC STUDIES: Oxygen Saturation is 98%  on RA, normal by my interpretation.    COORDINATION OF CARE: 3:57 PM- Pt advised of plan for treatment and pt agrees.  Labs (all labs ordered are listed, but only abnormal results are displayed) Labs Reviewed  CBC WITH DIFFERENTIAL/PLATELET - Abnormal; Notable for the following:       Result Value   RDW 15.8 (*)    All other components within normal limits  TROPONIN I  BASIC METABOLIC PANEL    EKG  EKG Interpretation  Date/Time:  Saturday May 25 2016 15:00:34 EST Ventricular Rate:  70 PR Interval:  142 QRS Duration: 78 QT Interval:  388 QTC Calculation: 419 R Axis:   69 Text Interpretation:  Normal sinus rhythm Normal ECG No significant change since last tracing Confirmed by Darshana Curnutt  MD, Reuel BoomANIEL [phone]([id number]) on 05/25/2016 3:51:40 PM       Radiology Dg Neck Soft Tissue  Result Date: 05/25/2016 CLINICAL DATA:  Mild stridor EXAM: NECK SOFT TISSUES - 1+ VIEW COMPARISON:  None. FINDINGS: The airway is widely patent. No subglottic narrowing is seen. Degenerative change of the cervical spine is noted. No gross soft tissue abnormality is seen. IMPRESSION: No acute abnormality noted. Electronically Signed   By: Alcide CleverMark  Lukens M.D.   On: 05/25/2016 16:37   Dg Chest 2 View  Result Date: 05/25/2016 CLINICAL DATA:  Shortness of breath and wheezing.  Smoker. EXAM: CHEST  2 VIEW COMPARISON:  04/13/2016 FINDINGS: Lucency at the lung apices compatible with known blebs or bullous changes. Evidence for scarring in the right upper lung. There is no new airspace disease or pulmonary edema. Heart and mediastinum are within normal limits. Mild endplate changes in the thoracic spine. No pleural effusions. IMPRESSION: Chronic lung changes without acute findings. Electronically Signed   By: Richarda OverlieAdam  Henn M.D.   On: 05/25/2016 15:20    Procedures Procedures (including critical care time)  Medications Ordered in ED Medications - No data to display   Initial Impression / Assessment and Plan / ED Course    I have reviewed the triage vital signs and the nursing notes.  Pertinent labs & imaging results that were available during my care of the patient were reviewed by me and considered in my medical decision making (see chart for details).  Clinical Course     55 y.o. male presents with vomiting and sharp, atypical left sided chest pain with shortness of breath starting this morning. He has faint stridor initially that improved with vocalization and resolved spontaneously. He endorses a water brash sensation. Appears he has significant GERD symptoms and VCD relating to this. Lungs are clear. No airway narrowing or abnormality on CXR. Started empirically on PPI. Plan to follow up with PCP as needed and return precautions discussed for worsening or new concerning symptoms.   Final Clinical Impressions(s) / ED Diagnoses   Final diagnoses:  Gastroesophageal reflux disease, esophagitis presence not specified  Vocal cord dysfunction    New Prescriptions Discharge Medication List as of 05/25/2016  6:28 PM    START taking these medications   Details  omeprazole (PRILOSEC) 20 MG capsule Take 1 capsule (20 mg total) by mouth 2 (two) times daily before a meal., Starting Sat 05/25/2016, Until Sat 06/08/2016, Print       I personally performed the services described in this documentation, which was scribed in my presence. The recorded information has been reviewed and is accurate.       Lyndal Pulleyaniel Analyse Angst, MD 05/26/16 [phone]

## 2016-07-09 ENCOUNTER — Encounter (HOSPITAL_COMMUNITY): Payer: Self-pay | Admitting: Emergency Medicine

## 2016-07-09 ENCOUNTER — Emergency Department (HOSPITAL_COMMUNITY): Payer: Medicaid Other

## 2016-07-09 ENCOUNTER — Inpatient Hospital Stay (HOSPITAL_COMMUNITY)
Admission: EM | Admit: 2016-07-09 | Discharge: 2016-07-24 | DRG: 191 | Disposition: A | Discharge status: Other Acute Inpatient Hospital | Payer: Medicaid Other | Attending: Internal Medicine | Admitting: Internal Medicine

## 2016-07-09 DIAGNOSIS — J441 Chronic obstructive pulmonary disease with (acute) exacerbation: Principal | ICD-10-CM | POA: Diagnosis present

## 2016-07-09 DIAGNOSIS — R079 Chest pain, unspecified: Secondary | ICD-10-CM | POA: Diagnosis present

## 2016-07-09 DIAGNOSIS — Z8719 Personal history of other diseases of the digestive system: Secondary | ICD-10-CM

## 2016-07-09 DIAGNOSIS — I10 Essential (primary) hypertension: Secondary | ICD-10-CM | POA: Diagnosis present

## 2016-07-09 DIAGNOSIS — R45850 Homicidal ideations: Secondary | ICD-10-CM | POA: Diagnosis not present

## 2016-07-09 DIAGNOSIS — E785 Hyperlipidemia, unspecified: Secondary | ICD-10-CM | POA: Diagnosis present

## 2016-07-09 DIAGNOSIS — F259 Schizoaffective disorder, unspecified: Secondary | ICD-10-CM | POA: Diagnosis present

## 2016-07-09 DIAGNOSIS — Z91018 Allergy to other foods: Secondary | ICD-10-CM

## 2016-07-09 DIAGNOSIS — Z7951 Long term (current) use of inhaled steroids: Secondary | ICD-10-CM

## 2016-07-09 DIAGNOSIS — F1410 Cocaine abuse, uncomplicated: Secondary | ICD-10-CM | POA: Diagnosis present

## 2016-07-09 DIAGNOSIS — G8929 Other chronic pain: Secondary | ICD-10-CM | POA: Diagnosis present

## 2016-07-09 DIAGNOSIS — E7800 Pure hypercholesterolemia, unspecified: Secondary | ICD-10-CM | POA: Diagnosis present

## 2016-07-09 DIAGNOSIS — Z79899 Other long term (current) drug therapy: Secondary | ICD-10-CM

## 2016-07-09 DIAGNOSIS — Z87891 Personal history of nicotine dependence: Secondary | ICD-10-CM

## 2016-07-09 DIAGNOSIS — E119 Type 2 diabetes mellitus without complications: Secondary | ICD-10-CM | POA: Diagnosis present

## 2016-07-09 DIAGNOSIS — R45851 Suicidal ideations: Secondary | ICD-10-CM | POA: Diagnosis present

## 2016-07-09 DIAGNOSIS — Z888 Allergy status to other drugs, medicaments and biological substances status: Secondary | ICD-10-CM

## 2016-07-09 DIAGNOSIS — F319 Bipolar disorder, unspecified: Secondary | ICD-10-CM | POA: Diagnosis present

## 2016-07-09 DIAGNOSIS — M25559 Pain in unspecified hip: Secondary | ICD-10-CM | POA: Diagnosis present

## 2016-07-09 DIAGNOSIS — F1910 Other psychoactive substance abuse, uncomplicated: Secondary | ICD-10-CM

## 2016-07-09 DIAGNOSIS — Z886 Allergy status to analgesic agent status: Secondary | ICD-10-CM

## 2016-07-09 DIAGNOSIS — J45901 Unspecified asthma with (acute) exacerbation: Secondary | ICD-10-CM | POA: Diagnosis present

## 2016-07-09 DIAGNOSIS — Z7952 Long term (current) use of systemic steroids: Secondary | ICD-10-CM

## 2016-07-09 DIAGNOSIS — Z880 Allergy status to penicillin: Secondary | ICD-10-CM

## 2016-07-09 DIAGNOSIS — K219 Gastro-esophageal reflux disease without esophagitis: Secondary | ICD-10-CM | POA: Diagnosis present

## 2016-07-09 DIAGNOSIS — F419 Anxiety disorder, unspecified: Secondary | ICD-10-CM | POA: Diagnosis present

## 2016-07-09 DIAGNOSIS — Z765 Malingerer [conscious simulation]: Secondary | ICD-10-CM

## 2016-07-09 DIAGNOSIS — E876 Hypokalemia: Secondary | ICD-10-CM | POA: Diagnosis present

## 2016-07-09 HISTORY — DX: Unspecified osteoarthritis, unspecified site: M19.90

## 2016-07-09 HISTORY — DX: Unspecified chronic bronchitis: J42

## 2016-07-09 HISTORY — DX: Gastro-esophageal reflux disease without esophagitis: K21.9

## 2016-07-09 HISTORY — DX: Type 2 diabetes mellitus without complications: E11.9

## 2016-07-09 HISTORY — DX: Schizophrenia, unspecified: F20.9

## 2016-07-09 HISTORY — DX: Personal history of other diseases of the digestive system: Z87.19

## 2016-07-09 HISTORY — DX: Unspecified asthma, uncomplicated: J45.909

## 2016-07-09 HISTORY — DX: Pneumonia, unspecified organism: J18.9

## 2016-07-09 HISTORY — DX: Cardiac arrhythmia, unspecified: I49.9

## 2016-07-09 HISTORY — DX: Pure hypercholesterolemia, unspecified: E78.00

## 2016-07-09 HISTORY — DX: Anxiety disorder, unspecified: F41.9

## 2016-07-09 LAB — CBC WITH DIFFERENTIAL/PLATELET
BASOS ABS: 0 10*3/uL (ref 0.0–0.1)
Basophils Relative: 0 %
Eosinophils Absolute: 0.2 10*3/uL (ref 0.0–0.7)
Eosinophils Relative: 5 %
HEMATOCRIT: 39 % (ref 39.0–52.0)
HEMOGLOBIN: 12.9 g/dL — AB (ref 13.0–17.0)
LYMPHS PCT: 32 %
Lymphs Abs: 1.5 10*3/uL (ref 0.7–4.0)
MCH: 27.8 pg (ref 26.0–34.0)
MCHC: 33.1 g/dL (ref 30.0–36.0)
MCV: 84.1 fL (ref 78.0–100.0)
Monocytes Absolute: 0.5 10*3/uL (ref 0.1–1.0)
Monocytes Relative: 11 %
NEUTROS ABS: 2.5 10*3/uL (ref 1.7–7.7)
NEUTROS PCT: 52 %
Platelets: 256 10*3/uL (ref 150–400)
RBC: 4.64 MIL/uL (ref 4.22–5.81)
RDW: 15.2 % (ref 11.5–15.5)
WBC: 4.7 10*3/uL (ref 4.0–10.5)

## 2016-07-09 LAB — INFLUENZA PANEL BY PCR (TYPE A & B)
INFLAPCR: NEGATIVE
Influenza B By PCR: NEGATIVE

## 2016-07-09 LAB — BASIC METABOLIC PANEL
ANION GAP: 10 (ref 5–15)
BUN: 10 mg/dL (ref 6–20)
CHLORIDE: 110 mmol/L (ref 101–111)
CO2: 21 mmol/L — ABNORMAL LOW (ref 22–32)
Calcium: 9 mg/dL (ref 8.9–10.3)
Creatinine, Ser: 1.01 mg/dL (ref 0.61–1.24)
GFR calc Af Amer: >60 mL/min (ref >60)
Glucose, Bld: 94 mg/dL (ref 65–99)
POTASSIUM: 3.4 mmol/L — AB (ref 3.5–5.1)
Sodium: 141 mmol/L (ref 135–145)

## 2016-07-09 LAB — CREATININE, SERUM
CREATININE: 1.23 mg/dL (ref 0.61–1.24)
GFR calc Af Amer: >60 mL/min (ref >60)
GFR calc non Af Amer: >60 mL/min (ref >60)

## 2016-07-09 LAB — CBC
HCT: 37.5 % — ABNORMAL LOW (ref 39.0–52.0)
Hemoglobin: 12.3 g/dL — ABNORMAL LOW (ref 13.0–17.0)
MCH: 27.9 pg (ref 26.0–34.0)
MCHC: 32.8 g/dL (ref 30.0–36.0)
MCV: 85 fL (ref 78.0–100.0)
Platelets: 280 10*3/uL (ref 150–400)
RBC: 4.41 MIL/uL (ref 4.22–5.81)
RDW: 15.4 % (ref 11.5–15.5)
WBC: 6 10*3/uL (ref 4.0–10.5)

## 2016-07-09 LAB — I-STAT TROPONIN, ED: Troponin i, poc: 0 ng/mL (ref 0.00–0.08)

## 2016-07-09 LAB — RAPID URINE DRUG SCREEN, HOSP PERFORMED
Amphetamines: NOT DETECTED
BARBITURATES: NOT DETECTED
BENZODIAZEPINES: NOT DETECTED
COCAINE: POSITIVE — AB
OPIATES: NOT DETECTED
TETRAHYDROCANNABINOL: NOT DETECTED

## 2016-07-09 LAB — PROCALCITONIN: Procalcitonin: <0.1 ng/mL

## 2016-07-09 LAB — ETHANOL: Alcohol, Ethyl (B): <5 mg/dL (ref <5)

## 2016-07-09 MED ORDER — FERROUS GLUCONATE 324 (38 FE) MG PO TABS
324.0000 mg | ORAL_TABLET | Freq: Every day | ORAL | Status: DC
  Administered 2016-07-10 – 2016-07-24 (×15): 324 mg via ORAL
  Filled 2016-07-09 (×15): qty 1

## 2016-07-09 MED ORDER — ADULT MULTIVITAMIN W/MINERALS CH
1.0000 | ORAL_TABLET | Freq: Every day | ORAL | Status: DC
  Administered 2016-07-09 – 2016-07-24 (×16): 1 via ORAL
  Filled 2016-07-09 (×16): qty 1

## 2016-07-09 MED ORDER — MORPHINE SULFATE (PF) 4 MG/ML IV SOLN
4.0000 mg | INTRAVENOUS | Status: DC | PRN
  Administered 2016-07-09 (×2): 4 mg via INTRAVENOUS
  Filled 2016-07-09 (×2): qty 1

## 2016-07-09 MED ORDER — ENOXAPARIN SODIUM 40 MG/0.4ML ~~LOC~~ SOLN
40.0000 mg | SUBCUTANEOUS | Status: DC
  Administered 2016-07-19 – 2016-07-22 (×3): 40 mg via SUBCUTANEOUS
  Filled 2016-07-09 (×9): qty 0.4

## 2016-07-09 MED ORDER — DIVALPROEX SODIUM 500 MG PO DR TAB
500.0000 mg | DELAYED_RELEASE_TABLET | Freq: Two times a day (BID) | ORAL | Status: DC
  Administered 2016-07-09 – 2016-07-24 (×31): 500 mg via ORAL
  Filled 2016-07-09 (×10): qty 1
  Filled 2016-07-09: qty 2
  Filled 2016-07-09 (×22): qty 1

## 2016-07-09 MED ORDER — THIAMINE HCL 100 MG/ML IJ SOLN: 100.0000 mg | Freq: Every day | INTRAMUSCULAR | Status: DC

## 2016-07-09 MED ORDER — LORAZEPAM 1 MG PO TABS
1.0000 mg | ORAL_TABLET | Freq: Four times a day (QID) | ORAL | Status: AC | PRN
  Administered 2016-07-09: 1 mg via ORAL
  Filled 2016-07-09: qty 1

## 2016-07-09 MED ORDER — ONDANSETRON HCL 4 MG PO TABS
4.0000 mg | ORAL_TABLET | Freq: Four times a day (QID) | ORAL | Status: DC | PRN
  Administered 2016-07-20: 4 mg via ORAL
  Filled 2016-07-09: qty 1

## 2016-07-09 MED ORDER — METHYLPREDNISOLONE SODIUM SUCC 125 MG IJ SOLR
125.0000 mg | Freq: Once | INTRAMUSCULAR | Status: AC
  Administered 2016-07-09: 125 mg via INTRAVENOUS
  Filled 2016-07-09: qty 2

## 2016-07-09 MED ORDER — AMLODIPINE BESYLATE 5 MG PO TABS
5.0000 mg | ORAL_TABLET | Freq: Every day | ORAL | Status: DC
  Administered 2016-07-09 – 2016-07-24 (×16): 5 mg via ORAL
  Filled 2016-07-09 (×16): qty 1

## 2016-07-09 MED ORDER — FLUPHENAZINE HCL 10 MG PO TABS
10.0000 mg | ORAL_TABLET | Freq: Every day | ORAL | Status: DC
  Administered 2016-07-10 – 2016-07-24 (×16): 10 mg via ORAL
  Filled 2016-07-09 (×15): qty 1

## 2016-07-09 MED ORDER — LORAZEPAM 2 MG/ML IJ SOLN
0.0000 mg | Freq: Two times a day (BID) | INTRAMUSCULAR | Status: AC
  Administered 2016-07-12: 2 mg via INTRAVENOUS
  Filled 2016-07-09: qty 1

## 2016-07-09 MED ORDER — FLUPHENAZINE HCL 10 MG PO TABS
20.0000 mg | ORAL_TABLET | Freq: Every day | ORAL | Status: DC
  Administered 2016-07-09 – 2016-07-23 (×15): 20 mg via ORAL
  Filled 2016-07-09 (×16): qty 2

## 2016-07-09 MED ORDER — ALBUTEROL (5 MG/ML) CONTINUOUS INHALATION SOLN
10.0000 mg/h | INHALATION_SOLUTION | Freq: Once | RESPIRATORY_TRACT | Status: AC
  Administered 2016-07-09: 10 mg/h via RESPIRATORY_TRACT
  Filled 2016-07-09: qty 20

## 2016-07-09 MED ORDER — SUCRALFATE 1 G PO TABS
1.0000 g | ORAL_TABLET | Freq: Three times a day (TID) | ORAL | Status: DC
  Administered 2016-07-09 – 2016-07-24 (×59): 1 g via ORAL
  Filled 2016-07-09 (×60): qty 1

## 2016-07-09 MED ORDER — ONDANSETRON HCL 4 MG/2ML IJ SOLN: 4.0000 mg | Freq: Four times a day (QID) | INTRAMUSCULAR | Status: DC | PRN

## 2016-07-09 MED ORDER — ATORVASTATIN CALCIUM 20 MG PO TABS
20.0000 mg | ORAL_TABLET | Freq: Every day | ORAL | Status: DC
  Administered 2016-07-09 – 2016-07-23 (×14): 20 mg via ORAL
  Filled 2016-07-09 (×15): qty 1

## 2016-07-09 MED ORDER — FOLIC ACID 1 MG PO TABS
1.0000 mg | ORAL_TABLET | Freq: Every day | ORAL | Status: DC
  Administered 2016-07-09 – 2016-07-24 (×16): 1 mg via ORAL
  Filled 2016-07-09 (×16): qty 1

## 2016-07-09 MED ORDER — POTASSIUM CHLORIDE CRYS ER 20 MEQ PO TBCR
30.0000 meq | EXTENDED_RELEASE_TABLET | Freq: Two times a day (BID) | ORAL | Status: AC
  Administered 2016-07-09 (×2): 30 meq via ORAL
  Filled 2016-07-09 (×2): qty 1

## 2016-07-09 MED ORDER — LORAZEPAM 2 MG/ML IJ SOLN: 1.0000 mg | Freq: Four times a day (QID) | INTRAMUSCULAR | Status: AC | PRN

## 2016-07-09 MED ORDER — LORATADINE 10 MG PO TABS: 10.0000 mg | ORAL_TABLET | Freq: Every day | ORAL | Status: DC | PRN

## 2016-07-09 MED ORDER — ZOLPIDEM TARTRATE 5 MG PO TABS
5.0000 mg | ORAL_TABLET | Freq: Every evening | ORAL | Status: DC | PRN
  Administered 2016-07-09: 5 mg via ORAL
  Filled 2016-07-09: qty 1

## 2016-07-09 MED ORDER — ENSURE ENLIVE PO LIQD
237.0000 mL | Freq: Two times a day (BID) | ORAL | Status: DC
  Administered 2016-07-10 (×2): 237 mL via ORAL

## 2016-07-09 MED ORDER — PANTOPRAZOLE SODIUM 40 MG PO TBEC
40.0000 mg | DELAYED_RELEASE_TABLET | Freq: Two times a day (BID) | ORAL | Status: DC
  Administered 2016-07-09 – 2016-07-24 (×31): 40 mg via ORAL
  Filled 2016-07-09 (×31): qty 1

## 2016-07-09 MED ORDER — HYDROCODONE-ACETAMINOPHEN 5-325 MG PO TABS
1.0000 | ORAL_TABLET | Freq: Four times a day (QID) | ORAL | Status: DC | PRN
  Administered 2016-07-09 – 2016-07-19 (×14): 1 via ORAL
  Filled 2016-07-09 (×14): qty 1

## 2016-07-09 MED ORDER — FLUPHENAZINE HCL 10 MG PO TABS: 10.0000 mg | ORAL_TABLET | ORAL | Status: DC

## 2016-07-09 MED ORDER — VITAMIN D (ERGOCALCIFEROL) 1.25 MG (50000 UNIT) PO CAPS
50000.0000 [IU] | ORAL_CAPSULE | ORAL | Status: DC
  Administered 2016-07-11 – 2016-07-18 (×2): 50000 [IU] via ORAL
  Filled 2016-07-09 (×2): qty 1

## 2016-07-09 MED ORDER — POLYETHYLENE GLYCOL 3350 17 G PO PACK
17.0000 g | PACK | Freq: Every day | ORAL | Status: DC | PRN
  Administered 2016-07-13 – 2016-07-22 (×3): 17 g via ORAL
  Filled 2016-07-09 (×3): qty 1

## 2016-07-09 MED ORDER — LORAZEPAM 2 MG/ML IJ SOLN
0.0000 mg | Freq: Four times a day (QID) | INTRAMUSCULAR | Status: AC
  Administered 2016-07-10 (×2): 2 mg via INTRAVENOUS
  Filled 2016-07-09 (×2): qty 1

## 2016-07-09 MED ORDER — METHYLPREDNISOLONE SODIUM SUCC 125 MG IJ SOLR
60.0000 mg | Freq: Two times a day (BID) | INTRAMUSCULAR | Status: DC
  Administered 2016-07-09 – 2016-07-11 (×5): 60 mg via INTRAVENOUS
  Filled 2016-07-09 (×6): qty 2

## 2016-07-09 MED ORDER — VITAMIN B-1 100 MG PO TABS
100.0000 mg | ORAL_TABLET | Freq: Every day | ORAL | Status: DC
  Administered 2016-07-09 – 2016-07-24 (×16): 100 mg via ORAL
  Filled 2016-07-09 (×15): qty 1

## 2016-07-09 MED ORDER — ONDANSETRON HCL 4 MG/2ML IJ SOLN
4.0000 mg | Freq: Once | INTRAMUSCULAR | Status: AC
  Administered 2016-07-09: 4 mg via INTRAVENOUS
  Filled 2016-07-09: qty 2

## 2016-07-09 MED ORDER — FAMOTIDINE 10 MG PO TABS
10.0000 mg | ORAL_TABLET | Freq: Two times a day (BID) | ORAL | Status: DC | PRN
  Administered 2016-07-09 – 2016-07-14 (×3): 10 mg via ORAL
  Filled 2016-07-09 (×5): qty 1

## 2016-07-09 NOTE — Progress Notes (Addendum)
[facility] for admission to [phone]

## 2016-07-09 NOTE — ED Notes (Addendum)
Report attempted. RN to call back.

## 2016-07-09 NOTE — ED Provider Notes (Signed)
[facility] Provider Note   CSN: [id number] Arrival date & time: 07/09/16  0800     History   Chief Complaint Chief Complaint  Patient presents with  . Shortness of Breath  . Chest Pain  . Suicidal  . Homicidal    HPI Francisco Beard is a 56 y.o. male.  HPI:  Patient states that he has a history of COPD. Has history of hypertension and congestive heart failure. Also has a history of cocaine abuse.  Since today stating that he has been short of breath for the last several days. He has history of COPD normal uses inhalers. Has been out of them for some time. Said cough. Has not had fever. Has not had chest pain. States he cannot sleep because of coughing and shortness of breath last night.   He admits that he "relapsed" he has been using cocaine for the last several days. He states that he just found out that his "brother killed my mother". He states that he is trying to find his brother so that he can "kill him".  He states he realizes that this is out of anger and likely from his cocaine use. He is presents here stating is short of breath and chest pain and also that he is still feeling homicidal towards his brother and request "act team help".  Past Medical History:  Diagnosis Date  . Asthma   . Bipolar disorder (HCC)   . CHF (congestive heart failure) (HCC)   . Chronic hip pain   . COPD (chronic obstructive pulmonary disease) (HCC)   . Depression   . Hernia   . Hypertension     Patient Active Problem List   Diagnosis Date Noted  . Bipolar disorder (HCC) 04/17/2016  . HTN (hypertension) 04/17/2016  . Chest pain   . COPD exacerbation (HCC) 04/13/2016  . COPD (chronic obstructive pulmonary disease) (HCC) 04/13/2016  . Avascular necrosis of bone of right hip (HCC) 04/08/2016  . COPD with acute exacerbation (HCC) 04/01/2016  . Suicidal ideations 08/17/2013    Past Surgical History:  Procedure Laterality Date  . HAND SURGERY    . shoulder surgery          Home Medications    Prior to Admission medications   Medication Sig Start Date End Date Taking? Authorizing Provider  albuterol (PROVENTIL HFA;VENTOLIN HFA) 108 (90 Base) MCG/ACT inhaler Inhale 1-2 puffs into the lungs every 4 (four) hours as needed for wheezing or shortness of breath. 04/17/16  Yes Rodolph Bonganiel Thompson V, MD  albuterol (PROVENTIL) (2.5 MG/3ML) 0.083% nebulizer solution Take 3 mLs (2.5 mg total) by nebulization every 4 (four) hours as needed for wheezing or shortness of breath. 04/17/16  Yes Rodolph Bonganiel Thompson V, MD  amLODipine (NORVASC) 5 MG tablet Take 1 tablet (5 mg total) by mouth daily. RESUME IN 3 DAYS. 04/20/16  Yes Rodolph Bonganiel Thompson V, MD  Ascorbic Acid (VITAMIN C PO) Take 1 tablet by mouth daily.   Yes Historical Provider, MD  atorvastatin (LIPITOR) 20 MG tablet Take 20 mg by mouth daily.   Yes Historical Provider, MD  Cholecalciferol (VITAMIN D3) 1000 units CAPS Take 1 capsule by mouth daily.   Yes Historical Provider, MD  divalproex (DEPAKOTE) 500 MG DR tablet Take 500 mg by mouth 2 (two) times daily. 07/01/16 07/31/16 Yes Historical Provider, MD  DULoxetine (CYMBALTA) 30 MG capsule Take 30 mg by mouth daily.   Yes Historical Provider, MD  famotidine (PEPCID AC) 10 MG chewable tablet Chew 10 mg  by mouth 2 (two) times daily as needed for heartburn.   Yes Historical Provider, MD  ferrous gluconate (FERGON) 324 MG tablet Take 324 mg by mouth daily.   Yes Historical Provider, MD  fluPHENAZine (PROLIXIN) 10 MG tablet Take 10-20 mg by mouth See admin instructions. Pt takes 10mg  in am, 20mg  in pm   Yes Historical Provider, MD  folic acid (FOLVITE) 1 MG tablet Take 1 mg by mouth daily.   Yes Historical Provider, MD  HYDROcodone-acetaminophen (NORCO/VICODIN) 5-325 MG tablet Take 1 tablet by mouth every 6 (six) hours as needed for moderate pain. 04/17/16  Yes Rodolph Bong, MD  loratadine (CLARITIN) 10 MG tablet Take 10 mg by mouth daily as needed for allergies.   Yes  Historical Provider, MD  mometasone-formoterol (DULERA) 200-5 MCG/ACT AERO Inhale 1 puff into the lungs 2 (two) times daily. 04/04/16  Yes Narda Bonds, MD  ondansetron (ZOFRAN) 4 MG tablet Take 4 mg by mouth daily as needed for nausea. 07/01/16 07/31/16 Yes Historical Provider, MD  pantoprazole (PROTONIX) 40 MG tablet Take 40 mg by mouth 2 (two) times daily.   Yes Historical Provider, MD  predniSONE (DELTASONE) 10 MG tablet Take 40 mg by mouth taper from 4 doses each day to 1 dose and stop. 07/02/16  Yes Historical Provider, MD  sucralfate (CARAFATE) 1 g tablet Take 1 g by mouth 4 (four) times daily -  with meals and at bedtime.   Yes Historical Provider, MD  theophylline (THEO-24) 300 MG 24 hr capsule Take 300 mg by mouth daily.   Yes Historical Provider, MD  tiotropium (SPIRIVA HANDIHALER) 18 MCG inhalation capsule Place 1 capsule (18 mcg total) into inhaler and inhale daily. 04/05/16  Yes Narda Bonds, MD  Vitamin D, Ergocalciferol, (DRISDOL) 50000 units CAPS capsule Take 50,000 Units by mouth once a week.   Yes Historical Provider, MD  omeprazole (PRILOSEC) 20 MG capsule Take 1 capsule (20 mg total) by mouth 2 (two) times daily before a meal. 05/25/16 06/08/16  Lyndal Pulley, MD  senna-docusate (SENOKOT-S) 8.6-50 MG tablet Take 2 tablets by mouth at bedtime. Patient not taking: Reported on 07/09/2016 04/17/16   Rodolph Bong, MD    Family History No family history on file.  Social History Social History  Substance Use Topics  . Smoking status: Former Smoker    Quit date: 04/08/2016  . Smokeless tobacco: Never Used  . Alcohol use 17.2 oz/week    12 Cans of beer, 20 Standard drinks or equivalent per week     Comment: "I had quit, started back 5 days ago"     Allergies   Strawberry extract; Tomato; Aleve [naproxen]; Aspirin; Ibuprofen; Penicillins; Seroquel [quetiapine]; and Onion   Review of Systems Review of Systems  Constitutional: Negative for appetite change, chills,  diaphoresis, fatigue and fever.  HENT: Negative for mouth sores, sore throat and trouble swallowing.   Eyes: Negative for visual disturbance.  Respiratory: Positive for cough, chest tightness, shortness of breath and wheezing.   Cardiovascular: Negative for chest pain.  Gastrointestinal: Negative for abdominal distention, abdominal pain, diarrhea, nausea and vomiting.  Endocrine: Negative for polydipsia, polyphagia and polyuria.  Genitourinary: Negative for dysuria, frequency and hematuria.  Musculoskeletal: Negative for gait problem.  Skin: Negative for color change, pallor and rash.  Neurological: Negative for dizziness, syncope, light-headedness and headaches.  Hematological: Does not bruise/bleed easily.  Psychiatric/Behavioral: Negative for behavioral problems and confusion.       Homicidal thoughts  Physical Exam Updated Vital Signs BP 107/74   Pulse 77   Temp 98.4 F (36.9 C) (Oral)   Resp 26   Ht 5\' 11"  (1.803 m)   Wt 160 lb (72.6 kg)   SpO2 97%   BMI 22.32 kg/m   Physical Exam  Constitutional: He is oriented to person, place, and time. He appears well-developed and well-nourished. No distress.  Awake and alert. Pleasant and cooperative.  HENT:  Head: Normocephalic.  Eyes: Conjunctivae are normal. Pupils are equal, round, and reactive to light. No scleral icterus.  Neck: Normal range of motion. Neck supple. No thyromegaly present.  Cardiovascular: Normal rate and regular rhythm.  Exam reveals no gallop and no friction rub.   No murmur heard. Pulmonary/Chest: He is in respiratory distress. He has wheezes. He has no rales.  Increased work of breathing. Wheezing and prolongation. Avoid diminished. 2-3 word dyspnea.  Abdominal: Soft. Bowel sounds are normal. He exhibits no distension. There is no tenderness. There is no rebound.  Musculoskeletal: Normal range of motion.  Neurological: He is alert and oriented to person, place, and time.  Skin: Skin is warm and dry.  No rash noted.  Psychiatric: He expresses homicidal ideation. He expresses homicidal plans.     ED Treatments / Results  Labs (all labs ordered are listed, but only abnormal results are displayed) Labs Reviewed  CBC WITH DIFFERENTIAL/PLATELET - Abnormal; Notable for the following:       Result Value   Hemoglobin 12.9 (*)    All other components within normal limits  BASIC METABOLIC PANEL - Abnormal; Notable for the following:    Potassium 3.4 (*)    CO2 21 (*)    All other components within normal limits  RAPID URINE DRUG SCREEN, HOSP PERFORMED - Abnormal; Notable for the following:    Cocaine POSITIVE (*)    All other components within normal limits  ETHANOL  I-STAT TROPOININ, ED    EKG  EKG Interpretation None       Radiology Dg Chest Port 1 View  Result Date: 07/09/2016 CLINICAL DATA:  Difficulty breathing and chest pain. EXAM: PORTABLE CHEST 1 VIEW COMPARISON:  Single-view of the chest 06/30/2016 and 06/24/2008. CT chest 04/13/2016. FINDINGS: Bullous emphysematous disease is seen. No edema focal airspace disease. No pneumothorax. No pleural effusion. Heart size is normal. IMPRESSION: No acute disease. Bullous emphysema. Electronically Signed   By: Drusilla Kanner M.D.   On: 07/09/2016 08:51    Procedures Procedures (including critical care time)  Medications Ordered in ED Medications  morphine 4 MG/ML injection 4 mg (4 mg Intravenous Given 07/09/16 0911)  albuterol (PROVENTIL,VENTOLIN) solution continuous neb (10 mg/hr Nebulization Given 07/09/16 0908)  methylPREDNISolone sodium succinate (SOLU-MEDROL) 125 mg/2 mL injection 125 mg (125 mg Intravenous Given 07/09/16 0908)  ondansetron (ZOFRAN) injection 4 mg (4 mg Intravenous Given 07/09/16 0909)     Initial Impression / Assessment and Plan / ED Course  I have reviewed the triage vital signs and the nursing notes.  Pertinent labs & imaging results that were available during my care of the patient were reviewed by me and  considered in my medical decision making (see chart for details).     Patient with history of COPD and acutely bronchospastic and restaurant distress. Chest tightness rather than pain. Plan EKG serial enzymes. Fibula is [facility] steroids chest x-ray lab evaluation ultimate [facility] evaluation. Tox screen.  Final Clinical Impressions(s) / ED Diagnoses   Final diagnoses:  COPD exacerbation (HCC)  Homicidal  ideation    On multiple re-evaluations patient remains with bronchospasm rhonchorous breath sounds frequent cough shortness of breath. He remains calm and pleasant and cooperative. Impression of care here. He continues to express that he would like to be evaluated by intact prior to discharge. However, at this point I feel he needs admission for continued treatment of his COPD exacerbation with bronchodilators continued steroids. He is not hypoxemic at rest. He is dyspneic with ambulation.  New Prescriptions New Prescriptions   No medications on file     Rolland Porter, MD 07/09/16 1221

## 2016-07-09 NOTE — H&P (Addendum)
History and Physical    Francisco Beard UJW:[id number] DOB: [birth year] DOA: 07/09/2016  PCP: [name], MD   Patient coming from: Home  Chief Complaint: Progressive SOB  HPI: Francisco Beard is a 56 y.o. male with medical history significant of GI Bleed, Mallory Weiss tears, GERD, CHF, HTN and COPD with asthma, bipolar disorder with hallucinations who presented to the ED with c/o progressive SOB. Onset of symptoms last night and patient was unable to sleep d/t progressive dyspnea and cough. Patient usually uses inhalers, but recently has been out of supply.  He denied fever, chills, myalgia, rhinorrhea or watery eyes.   He has a history of cocaine use and admits to relapsing and using it over the weekend. He is also very stressed and angry repeating that he just realized that the brother kills his mother and he is going to kill his brother.  Patient reported having flu like symptoms and subjective fever with chills since the weekend. He also c/o chest pain describing it as "a full truck of load sitting on my chest" and idigestion  ED Course: on arrival patient was  Afebrile, tachypneic , but maintained his SpO2 above 92% on RA Blood work was unremarkable and showed mild hypokalemia, Hgb 12.9 (after recent hospitalization for GI bleed in [place] (most recent in December 13.8) Chest XRay did not reveal any acute cardiopulmonary disease  Review of Systems: As per HPI otherwise 10 point review of systems negative.   Ambulatory Status: Inepndent  Past Medical History:  Diagnosis Date  . Asthma   . Bipolar disorder (HCC)   . CHF (congestive heart failure) (HCC)   . Chronic hip pain   . COPD (chronic obstructive pulmonary disease) (HCC)   . Depression   . Hernia   . Hypertension     Past Surgical History:  Procedure Laterality Date  . HAND SURGERY    . shoulder surgery      Social History   Social History  . Marital status: Divorced    Spouse name: N/A  . Number of children:  N/A  . Years of education: N/A   Occupational History  . Not on file.   Social History Main Topics  . Smoking status: Former Smoker    Quit date: 04/08/2016  . Smokeless tobacco: Never Used  . Alcohol use 17.2 oz/week    12 Cans of beer, 20 Standard drinks or equivalent per week     Comment: "I had quit, started back 5 days ago"  . Drug use: Yes    Types: Cocaine     Comment: crack  . Sexual activity: Not Currently   Other Topics Concern  . Not on file   Social History Narrative  . No narrative on file    Allergies  Allergen Reactions  . Strawberry Extract Anaphylaxis  . Tomato Anaphylaxis  . Aleve [Naproxen] Other (See Comments)    Heart problems so MD told him to not take  . Aspirin Swelling  . Ibuprofen Swelling  . Penicillins Swelling    Has patient had a PCN reaction causing immediate rash, facial/tongue/throat swelling, SOB or lightheadedness with hypotension: Yes Has patient had a PCN reaction causing severe rash involving mucus membranes or skin necrosis: Yes Has patient had a PCN reaction that required hospitalization: Yes Has patient had a PCN reaction occurring within the last 10 years: No If all of the above answers are "NO", then may proceed with Cephalosporin use.   . Seroquel [Quetiapine] Other (See Comments)  In higher doses, this causes excessive sedation  . Onion Nausea And Vomiting and Rash    No family history on file.  Prior to Admission medications   Medication Sig Start Date End Date Taking? Authorizing Provider  albuterol (PROVENTIL HFA;VENTOLIN HFA) 108 (90 Base) MCG/ACT inhaler Inhale 1-2 puffs into the lungs every 4 (four) hours as needed for wheezing or shortness of breath. 04/17/16  Yes Rodolph Bong, MD  albuterol (PROVENTIL) (2.5 MG/3ML) 0.083% nebulizer solution Take 3 mLs (2.5 mg total) by nebulization every 4 (four) hours as needed for wheezing or shortness of breath. 04/17/16  Yes Rodolph Bong, MD  amLODipine (NORVASC) 5  MG tablet Take 1 tablet (5 mg total) by mouth daily. RESUME IN 3 DAYS. 04/20/16  Yes Rodolph Bong, MD  Ascorbic Acid (VITAMIN C PO) Take 1 tablet by mouth daily.   Yes Historical Provider, MD  atorvastatin (LIPITOR) 20 MG tablet Take 20 mg by mouth daily.   Yes Historical Provider, MD  Cholecalciferol (VITAMIN D3) 1000 units CAPS Take 1 capsule by mouth daily.   Yes Historical Provider, MD  divalproex (DEPAKOTE) 500 MG DR tablet Take 500 mg by mouth 2 (two) times daily. 07/01/16 07/31/16 Yes Historical Provider, MD  DULoxetine (CYMBALTA) 30 MG capsule Take 30 mg by mouth daily.   Yes Historical Provider, MD  famotidine (PEPCID AC) 10 MG chewable tablet Chew 10 mg by mouth 2 (two) times daily as needed for heartburn.   Yes Historical Provider, MD  ferrous gluconate (FERGON) 324 MG tablet Take 324 mg by mouth daily.   Yes Historical Provider, MD  fluPHENAZine (PROLIXIN) 10 MG tablet Take 10-20 mg by mouth See admin instructions. Pt takes 10mg  in am, 20mg  in pm   Yes Historical Provider, MD  folic acid (FOLVITE) 1 MG tablet Take 1 mg by mouth daily.   Yes Historical Provider, MD  HYDROcodone-acetaminophen (NORCO/VICODIN) 5-325 MG tablet Take 1 tablet by mouth every 6 (six) hours as needed for moderate pain. 04/17/16  Yes Rodolph Bong, MD  loratadine (CLARITIN) 10 MG tablet Take 10 mg by mouth daily as needed for allergies.   Yes Historical Provider, MD  mometasone-formoterol (DULERA) 200-5 MCG/ACT AERO Inhale 1 puff into the lungs 2 (two) times daily. 04/04/16  Yes Narda Bonds, MD  ondansetron (ZOFRAN) 4 MG tablet Take 4 mg by mouth daily as needed for nausea. 07/01/16 07/31/16 Yes Historical Provider, MD  pantoprazole (PROTONIX) 40 MG tablet Take 40 mg by mouth 2 (two) times daily.   Yes Historical Provider, MD  predniSONE (DELTASONE) 10 MG tablet Take 40 mg by mouth taper from 4 doses each day to 1 dose and stop. 07/02/16  Yes Historical Provider, MD  sucralfate (CARAFATE) 1 g tablet Take 1 g  by mouth 4 (four) times daily -  with meals and at bedtime.   Yes Historical Provider, MD  theophylline (THEO-24) 300 MG 24 hr capsule Take 300 mg by mouth daily.   Yes Historical Provider, MD  tiotropium (SPIRIVA HANDIHALER) 18 MCG inhalation capsule Place 1 capsule (18 mcg total) into inhaler and inhale daily. 04/05/16  Yes Narda Bonds, MD  Vitamin D, Ergocalciferol, (DRISDOL) 50000 units CAPS capsule Take 50,000 Units by mouth once a week.   Yes Historical Provider, MD  omeprazole (PRILOSEC) 20 MG capsule Take 1 capsule (20 mg total) by mouth 2 (two) times daily before a meal. 05/25/16 06/08/16  Lyndal Pulley, MD  senna-docusate (SENOKOT-S) 8.6-50 MG tablet Take 2  tablets by mouth at bedtime. Patient not taking: Reported on 07/09/2016 04/17/16   Rodolph Bonganiel Thompson V, MD    Physical Exam: Vitals:   07/09/16 1015 07/09/16 1030 07/09/16 1100 07/09/16 1135  BP: 105/66 114/65 106/65 107/74  Pulse: 66 68 69 77  Resp: 19 22 20 26   Temp:      TempSrc:      SpO2: 99% 100% 99% 97%  Weight:      Height:         General: Appears calm and comfortable Eyes: PERRLA, EOMI, normal lids, iris ENT:  grossly normal hearing, lips & tongue, mucous membranes moist and intact Neck: no lymphoadenopathy, masses or thyromegaly Cardiovascular: RRR, no m/r/g. No JVD, carotid bruits. No LE edema.  Respiratory: bilateral diffuse wheezes, rales, rhonchi. Normal respiratory effort. No accessory muscle use observed Abdomen: soft, non-tender, non-distended, no organomegaly or masses appreciated. BS present in all quadrants Skin: no rash, ulcers or induration seen on limited exam Musculoskeletal: grossly normal tone BUE/BLE, good ROM, no bony abnormality or joint deformities observed Psychiatric: grossly normal mood and affect, speech fluent and appropriate, alert and oriented x3 Neurologic: CN II-XII grossly intact, moves all extremities in coordinated fashion, sensation intact  Labs on Admission: I have personally  reviewed following labs and imaging studies  CBC, BMP  GFR: Estimated Creatinine Clearance: 82.9 mL/min (by C-G formula based on SCr of 1.01 mg/dL).   Creatinine Clearance: Estimated Creatinine Clearance: 82.9 mL/min (by C-G formula based on SCr of 1.01 mg/dL).   Radiological Exams on Admission: Dg Chest Port 1 View  Result Date: 07/09/2016 CLINICAL DATA:  Difficulty breathing and chest pain. EXAM: PORTABLE CHEST 1 VIEW COMPARISON:  Single-view of the chest 06/30/2016 and 06/24/2008. CT chest 04/13/2016. FINDINGS: Bullous emphysematous disease is seen. No edema focal airspace disease. No pneumothorax. No pleural effusion. Heart size is normal. IMPRESSION: No acute disease. Bullous emphysema. Electronically Signed   By: Drusilla Kannerhomas  Dalessio M.D.   On: 07/09/2016 08:51    EKG: not found today  Assessment/Plan Principal Problem:   Acute exacerbation of COPD with asthma (HCC) Active Problems:   Suicidal ideations   Bipolar disorder (HCC)   HTN (hypertension)   Chest pain   GERD (gastroesophageal reflux disease)   Polysubstance abuse   COPD exacerbation - continue nebulizer treatments, IV steroid, IV antibiotics Will check Procalcitonin and Influenza A PCR  Chest pain - could be associated with cocaine use First set of troponin is negative Will check EKG, continue cycle troponin  Bipolar disorder with homicidal ideation Continue home meds, provide sitter for safety Psych consult was requested by EDP  Polysubstance abuse - ETOH level is pending, UDS is positive for Cocaine Will start on CIWA protocol - last large alcohol intake  Was last night  GERD with recent history of GI bleed in January treated with Hemoclip Patient still c/o heartburn and abdominal discomfort after meal Will increase PPI to bid, continue sucralfate  Hyperlipidemia  Continue statin  Hypokalemia Replace and recheck  DVT prophylaxis: Lovenox Code Status: full Family Communication: none Disposition  Plan: Med Surg Consults called: none  Admission status: observation   Raymon MuttonMarina Kyazimova, PA-C Pager: [phone] [facility]  If 7PM-7AM, please contact night-coverage [email or web address] Password [email or web address]  07/09/2016, 1:32 PM    Attending MD note  Patient was seen, examined,treatment plan was discussed with the  Advance Practice Provider.  I have personally reviewed the clinical findings, lab,EKG, imaging studies and management of this patient in detail.I have also reviewed the orders  written for this patient which were under my direction. I agree with the documentation, as recorded by the Advance Practice Provider.   Aharon Carriere is a 56 y.o. male who presents with respiratory distress secondary to COPD exacerbation with poor social situation including polysubstance abuse and elevated alcohol intake. Chest pain likely secondary to pneumonitis versus cardiac. EKG, initial troponin reassuring. We'll have to monitor for 24 hours.  Psych consult pending for HI given pts recent anger towards his brother whom he says killed his mother and whom he now wants to murder. OK to have sitter w/ pt - confimred w/ charge nurse. Will have security if pt becomes volatile.    Blondell Laperle, Elmon Else, MD Family Medicine See Amion for pager # [facility]

## 2016-07-09 NOTE — Progress Notes (Signed)
Leanord AsalJohn Formica is a 56 y.o. male patient admitted from ED awake, alert - oriented  X 4 - no acute distress noted.  VSS - Blood pressure 120/75, pulse 81, temperature 98.1 F (36.7 C), temperature source Oral, resp. rate 18, height 5\' 11"  (1.803 m), weight 72.6 kg (160 lb), SpO2 99 %.    IV in place, occlusive dsg intact without redness.  Orientation to room, and floor completed with information packet given to patient/family.  Patient declined safety video at this time.  Admission INP armband ID verified with patient/family, and in place.   SR up x 2, fall assessment complete, with patient and family able to verbalize understanding of risk associated with falls, and verbalized understanding to call nsg before up out of bed.  Call light within reach, patient able to voice, and demonstrate understanding.   Safety sitter at bedside.      Will cont to eval and treat per MD orders.  Doran HeaterMONROE, Liona Wengert C, RN 07/09/2016 6:45 PM

## 2016-07-09 NOTE — ED Triage Notes (Addendum)
Pt arrives from home via [facility] reporting cough with CP x 3 days, non productive.  EMS reports giving 7.5 mg albuterol, 0.5 mg atrovent.  Pt reports chronic nausea, denies diarrhea.  Pt reports using cocaine past 4 days, reports no sleep.  Pt reports auditory and visual hallucinations, reports command hallucinations, "They tell me to hurt people."  Pt states, "I just want to hurt my brother real bad. I need help."

## 2016-07-10 DIAGNOSIS — Z8719 Personal history of other diseases of the digestive system: Secondary | ICD-10-CM | POA: Diagnosis not present

## 2016-07-10 DIAGNOSIS — F1410 Cocaine abuse, uncomplicated: Secondary | ICD-10-CM | POA: Diagnosis present

## 2016-07-10 DIAGNOSIS — Z79899 Other long term (current) drug therapy: Secondary | ICD-10-CM | POA: Diagnosis not present

## 2016-07-10 DIAGNOSIS — R45850 Homicidal ideations: Secondary | ICD-10-CM | POA: Diagnosis present

## 2016-07-10 DIAGNOSIS — J45901 Unspecified asthma with (acute) exacerbation: Secondary | ICD-10-CM | POA: Diagnosis present

## 2016-07-10 DIAGNOSIS — Z880 Allergy status to penicillin: Secondary | ICD-10-CM | POA: Diagnosis not present

## 2016-07-10 DIAGNOSIS — Z888 Allergy status to other drugs, medicaments and biological substances status: Secondary | ICD-10-CM

## 2016-07-10 DIAGNOSIS — F312 Bipolar disorder, current episode manic severe with psychotic features: Secondary | ICD-10-CM

## 2016-07-10 DIAGNOSIS — E119 Type 2 diabetes mellitus without complications: Secondary | ICD-10-CM | POA: Diagnosis present

## 2016-07-10 DIAGNOSIS — Z7951 Long term (current) use of inhaled steroids: Secondary | ICD-10-CM | POA: Diagnosis not present

## 2016-07-10 DIAGNOSIS — J441 Chronic obstructive pulmonary disease with (acute) exacerbation: Principal | ICD-10-CM

## 2016-07-10 DIAGNOSIS — E876 Hypokalemia: Secondary | ICD-10-CM | POA: Diagnosis present

## 2016-07-10 DIAGNOSIS — R45851 Suicidal ideations: Secondary | ICD-10-CM | POA: Diagnosis present

## 2016-07-10 DIAGNOSIS — I10 Essential (primary) hypertension: Secondary | ICD-10-CM | POA: Diagnosis present

## 2016-07-10 DIAGNOSIS — Z765 Malingerer [conscious simulation]: Secondary | ICD-10-CM | POA: Diagnosis not present

## 2016-07-10 DIAGNOSIS — Z9889 Other specified postprocedural states: Secondary | ICD-10-CM

## 2016-07-10 DIAGNOSIS — E7800 Pure hypercholesterolemia, unspecified: Secondary | ICD-10-CM | POA: Diagnosis present

## 2016-07-10 DIAGNOSIS — F1910 Other psychoactive substance abuse, uncomplicated: Secondary | ICD-10-CM | POA: Diagnosis not present

## 2016-07-10 DIAGNOSIS — G8929 Other chronic pain: Secondary | ICD-10-CM | POA: Diagnosis present

## 2016-07-10 DIAGNOSIS — Z91018 Allergy to other foods: Secondary | ICD-10-CM

## 2016-07-10 DIAGNOSIS — E785 Hyperlipidemia, unspecified: Secondary | ICD-10-CM | POA: Diagnosis present

## 2016-07-10 DIAGNOSIS — Z87891 Personal history of nicotine dependence: Secondary | ICD-10-CM | POA: Diagnosis not present

## 2016-07-10 DIAGNOSIS — F1490 Cocaine use, unspecified, uncomplicated: Secondary | ICD-10-CM | POA: Diagnosis not present

## 2016-07-10 DIAGNOSIS — Z886 Allergy status to analgesic agent status: Secondary | ICD-10-CM | POA: Diagnosis not present

## 2016-07-10 DIAGNOSIS — F259 Schizoaffective disorder, unspecified: Secondary | ICD-10-CM | POA: Diagnosis present

## 2016-07-10 DIAGNOSIS — R0602 Shortness of breath: Secondary | ICD-10-CM | POA: Diagnosis present

## 2016-07-10 DIAGNOSIS — K219 Gastro-esophageal reflux disease without esophagitis: Secondary | ICD-10-CM | POA: Diagnosis present

## 2016-07-10 DIAGNOSIS — R072 Precordial pain: Secondary | ICD-10-CM | POA: Diagnosis not present

## 2016-07-10 DIAGNOSIS — F319 Bipolar disorder, unspecified: Secondary | ICD-10-CM | POA: Diagnosis present

## 2016-07-10 DIAGNOSIS — M25559 Pain in unspecified hip: Secondary | ICD-10-CM | POA: Diagnosis present

## 2016-07-10 DIAGNOSIS — F419 Anxiety disorder, unspecified: Secondary | ICD-10-CM | POA: Diagnosis present

## 2016-07-10 LAB — CBC WITH DIFFERENTIAL/PLATELET
Basophils Absolute: 0 10*3/uL (ref 0.0–0.1)
Basophils Relative: 0 %
EOS ABS: 0 10*3/uL (ref 0.0–0.7)
EOS PCT: 0 %
HCT: 37 % — ABNORMAL LOW (ref 39.0–52.0)
Hemoglobin: 12 g/dL — ABNORMAL LOW (ref 13.0–17.0)
LYMPHS ABS: 0.9 10*3/uL (ref 0.7–4.0)
Lymphocytes Relative: 8 %
MCH: 27.9 pg (ref 26.0–34.0)
MCHC: 32.4 g/dL (ref 30.0–36.0)
MCV: 86 fL (ref 78.0–100.0)
MONO ABS: 0.5 10*3/uL (ref 0.1–1.0)
MONOS PCT: 5 %
Neutro Abs: 9.3 10*3/uL — ABNORMAL HIGH (ref 1.7–7.7)
Neutrophils Relative %: 87 %
PLATELETS: 288 10*3/uL (ref 150–400)
RBC: 4.3 MIL/uL (ref 4.22–5.81)
RDW: 15.7 % — ABNORMAL HIGH (ref 11.5–15.5)
WBC: 10.6 10*3/uL — AB (ref 4.0–10.5)

## 2016-07-10 LAB — BASIC METABOLIC PANEL
Anion gap: 12 (ref 5–15)
BUN: 9 mg/dL (ref 6–20)
CO2: 18 mmol/L — ABNORMAL LOW (ref 22–32)
CREATININE: 0.88 mg/dL (ref 0.61–1.24)
Calcium: 9.6 mg/dL (ref 8.9–10.3)
Chloride: 107 mmol/L (ref 101–111)
GFR calc Af Amer: >60 mL/min (ref >60)
GLUCOSE: 130 mg/dL — AB (ref 65–99)
Potassium: 4.9 mmol/L (ref 3.5–5.1)
SODIUM: 137 mmol/L (ref 135–145)

## 2016-07-10 LAB — TROPONIN I: Troponin I: <0.03 ng/mL (ref <0.03)

## 2016-07-10 MED ORDER — IPRATROPIUM-ALBUTEROL 0.5-2.5 (3) MG/3ML IN SOLN
3.0000 mL | Freq: Four times a day (QID) | RESPIRATORY_TRACT | Status: DC
  Administered 2016-07-10: 3 mL via RESPIRATORY_TRACT
  Filled 2016-07-10: qty 3

## 2016-07-10 MED ORDER — LEVOFLOXACIN IN D5W 750 MG/150ML IV SOLN
750.0000 mg | INTRAVENOUS | Status: DC
  Administered 2016-07-10 – 2016-07-11 (×2): 750 mg via INTRAVENOUS
  Filled 2016-07-10 (×3): qty 150

## 2016-07-10 MED ORDER — BENZTROPINE MESYLATE 1 MG PO TABS
1.0000 mg | ORAL_TABLET | Freq: Two times a day (BID) | ORAL | Status: DC
  Administered 2016-07-10 – 2016-07-24 (×28): 1 mg via ORAL
  Filled 2016-07-10 (×29): qty 1

## 2016-07-10 MED ORDER — QUETIAPINE FUMARATE 25 MG PO TABS
100.0000 mg | ORAL_TABLET | Freq: Every day | ORAL | Status: DC
  Administered 2016-07-10 – 2016-07-23 (×14): 100 mg via ORAL
  Filled 2016-07-10 (×14): qty 4

## 2016-07-10 MED ORDER — IPRATROPIUM-ALBUTEROL 0.5-2.5 (3) MG/3ML IN SOLN
3.0000 mL | Freq: Three times a day (TID) | RESPIRATORY_TRACT | Status: DC
  Administered 2016-07-10 – 2016-07-11 (×2): 3 mL via RESPIRATORY_TRACT
  Filled 2016-07-10 (×2): qty 3

## 2016-07-10 NOTE — Progress Notes (Signed)
CSW received consult regarding substance use. Patient reported that he drinks alcohol and also does crack. He reported that the drug use is causing problems for him in all aspects of his life and he would like to do a residential treatment program. CSW provided resources and patient stated that he has been to [facility] in [place] before and he would like to do that again, but he knows they do not often have beds. He reported that he would give them a call.  CSW signing off.  Osborne Cascoadia Herberta Pickron LCSWA [phone]

## 2016-07-10 NOTE — Progress Notes (Signed)
Responded to consult for suicidal thoughts, but pt not available to discuss. Will check back later.   07/10/16 1100  Clinical Encounter Type  Visited With Patient not available  Visit Type Psychological support;Spiritual support;Social support  Referral From Nurse   Ephraim Hamburgerynthia A Kamyia Thomason, Chaplain

## 2016-07-10 NOTE — Consult Note (Signed)
[facility] Consult   Reason for Consult:  Hallucinations, substance abuse, suicidal/homicidal ideation Referring Physician:  Dr. Tana Coast Patient Identification: Francisco Beard MRN:  [id number] Principal Diagnosis: Acute exacerbation of COPD with asthma [facility]) Diagnosis:   Patient Active Problem List   Diagnosis Date Noted  . Acute exacerbation of COPD with asthma ([place]) [J44.1, I33.825] 07/09/2016  . GERD (gastroesophageal reflux disease) [K21.9] 07/09/2016  . Polysubstance abuse [F19.10] 07/09/2016  . Homicidal ideation [R45.850]   . Bipolar disorder ([place]) [F31.9] 04/17/2016  . HTN (hypertension) [I10] 04/17/2016  . Chest pain [R07.9]   . COPD exacerbation ([place]) [J44.1] 04/13/2016  . COPD (chronic obstructive pulmonary disease) ([place]) [J44.9] 04/13/2016  . Avascular necrosis of bone of right hip ([place]) [M87.051] 04/08/2016  . COPD with acute exacerbation ([place]) [J44.1] 04/01/2016  . Suicidal ideations [R45.851] 08/17/2013    Total Time spent with patient: 1 hour  Subjective:   Francisco Beard is a 56 y.o. male patient admitted with progressive shortness of breath, intoxication with illicit substances.  HPI:  Francisco Beard a 56 y.o.male, seen, chart reviewed for this face-to-face psychiatric consultation and evaluation of increased symptoms of bipolar depression, psychosis, relapsed on drugs of abuse which is cocaine and also reported suicidal/homicidal ideation. Patient stated that he has been living by himself in an apartment and has a friends who is, around with alcohol and cocaine. Patient reported he spent about $400 worth of cocaine and hallucinating and could not sleep before coming to the hospital. Patient also reported he was moved out of his mother's home to an apartment by his brother moved into his mother's home. Reportedly patient stated his mother were shocked by his brother because of conflict regarding money. Patient reportedly was upset, angry and won't kill his  brother but ended up relapsing his drug of abuse which is alcohol and cocaine which makes him more psychotic with hallucinations. Review of his medical records from [facility] indicated patient has been diagnosed with a schizoaffective disorder/bipolar disorder and also more frequently visit the hospital with malingering hallucinations, suicidal/homicidal ideations. Patient usually well within few days and then go home. Patient is requesting to be sent him to [facility] for long-term psychiatric hospitalization. Urine drug screen indicated positive for cocaine and blood alcohol is not significant. Patient does not know what medication he takes but reportedly ACT team is following up with him and acting staff review his medications. This and did not contact his acting members since came to the hospital. She reportedly deceived acting services from [facility]" and reportedly treated by Dr. Gae Bon.  Medical history: Patient with medical history significant of GI Bleed, Mallory Weiss tears, GERD, CHF, HTN and COPD with asthma, bipolar disorder with hallucinations who presented to the ED with c/o progressive SOB. Onset of symptoms last night and patient was unable to sleep d/t progressive dyspnea and cough. Patient usually uses inhalers, but recently has been out of supply. He has a history of cocaine use and admits to relapsing and using it over the weekend. He is also very stressed and angry repeating that he just realized that the brother killed his mother and he is going to kill his brother. Patient reported auditory and visual hallucinations and voices commanding him to hurt people.  Past Psychiatric History: Schizoaffective disorder, cocaine and alcohol abuse versus dependence and questionable noncompliant with medications and malingering. Patient has multiple acute psychiatric hospitalization at [facility] in his last hospitalization was  05/28/2016.  Risk to Self: Is patient at risk for suicide?: Yes Risk to Others:   Prior Inpatient Therapy:   Prior Outpatient Therapy:    Past Medical History:  Past Medical History:  Diagnosis Date  . Anxiety   . Arthritis    "hips; bottom part of my back" (07/09/2016)  . Asthmatic bronchitis   . Bipolar disorder ([place])   . CHF (congestive heart failure) ([place])   . Chronic bronchitis ([place])   . Chronic hip pain   . COPD (chronic obstructive pulmonary disease) ([place])   . Depression   . GERD (gastroesophageal reflux disease)   . High cholesterol   . History of hiatal hernia   . Hypertension   . Irregular heart beats   . Pneumonia    "3 times; it's been awhile" (07/09/2016)  . Schizophrenia ([place])   . Type II diabetes mellitus ([place])     Past Surgical History:  Procedure Laterality Date  . CLOSED REDUCTION HAND FRACTURE Right    "got 6 screws and a plate in there"  . ELBOW FRACTURE SURGERY Left    "put it through the wall"  . FRACTURE SURGERY     Family History: History reviewed. No pertinent family history. Family Psychiatric  History: Noncontributory Social History:  History  Alcohol Use  . 12.0 oz/week  . 20 Standard drinks or equivalent per week    Comment: 07/09/2016 "I had quit for 1 yr, relapsed 3 days ago"     History  Drug Use  . Types: "Crack" cocaine    Comment: 07/09/2016 "I had quit for 1 yr, relapsed 3 days ago"    Social History   Social History  . Marital status: Divorced    Spouse name: N/A  . Number of children: N/A  . Years of education: N/A   Social History Main Topics  . Smoking status: Former Smoker    Packs/day: 3.00    Years: 41.00    Types: Cigarettes    Quit date: 04/08/2016  . Smokeless tobacco: Never Used  . Alcohol use 12.0 oz/week    20 Standard drinks or equivalent per week     Comment: 07/09/2016 "I had quit for 1 yr, relapsed 3 days ago"  . Drug use: Yes    Types: "Crack" cocaine     Comment: 07/09/2016 "I had quit for 1 yr, relapsed  3 days ago"  . Sexual activity: Not Currently   Other Topics Concern  . None   Social History Narrative  . None   Additional Social History:    Allergies:   Allergies  Allergen Reactions  . Strawberry Extract Anaphylaxis  . Tomato Anaphylaxis  . Aleve [Naproxen] Other (See Comments)    Heart problems so MD told him to not take  . Aspirin Swelling  . Ibuprofen Swelling  . Penicillins Swelling    Has patient had a PCN reaction causing immediate rash, facial/tongue/throat swelling, SOB or lightheadedness with hypotension: Yes Has patient had a PCN reaction causing severe rash involving mucus membranes or skin necrosis: Yes Has patient had a PCN reaction that required hospitalization: Yes Has patient had a PCN reaction occurring within the last 10 years: No If all of the above answers are "NO", then may proceed with Cephalosporin use.   . Seroquel [Quetiapine] Other (See Comments)    In higher doses, this causes excessive sedation  . Onion Nausea And Vomiting and Rash    Labs:  Results for orders placed or performed during the hospital encounter of  07/09/16 (from the past 48 hour(s))  CBC with Differential/Platelet     Status: Abnormal   Collection Time: 07/09/16  9:02 AM  Result Value Ref Range   WBC 4.7 4.0 - 10.5 K/uL   RBC 4.64 4.22 - 5.81 MIL/uL   Hemoglobin 12.9 (L) 13.0 - 17.0 g/dL   HCT 39.0 39.0 - 52.0 %   MCV 84.1 78.0 - 100.0 fL   MCH 27.8 26.0 - 34.0 pg   MCHC 33.1 30.0 - 36.0 g/dL   RDW 15.2 11.5 - 15.5 %   Platelets 256 150 - 400 K/uL   Neutrophils Relative % 52 %   Neutro Abs 2.5 1.7 - 7.7 K/uL   Lymphocytes Relative 32 %   Lymphs Abs 1.5 0.7 - 4.0 K/uL   Monocytes Relative 11 %   Monocytes Absolute 0.5 0.1 - 1.0 K/uL   Eosinophils Relative 5 %   Eosinophils Absolute 0.2 0.0 - 0.7 K/uL   Basophils Relative 0 %   Basophils Absolute 0.0 0.0 - 0.1 K/uL  Basic metabolic panel     Status: Abnormal   Collection Time: 07/09/16  9:02 AM  Result Value Ref  Range   Sodium 141 135 - 145 mmol/L   Potassium 3.4 (L) 3.5 - 5.1 mmol/L   Chloride 110 101 - 111 mmol/L   CO2 21 (L) 22 - 32 mmol/L   Glucose, Bld 94 65 - 99 mg/dL   BUN 10 6 - 20 mg/dL   Creatinine, Ser 1.01 0.61 - 1.24 mg/dL   Calcium 9.0 8.9 - 10.3 mg/dL   GFR calc non Af Amer >60 >60 mL/min   GFR calc Af Amer >60 >60 mL/min    Comment: (NOTE) The eGFR has been calculated using the CKD EPI equation. This calculation has not been validated in all clinical situations. eGFR's persistently <60 mL/min signify possible Chronic Kidney Disease.    Anion gap 10 5 - 15  Rapid urine drug screen (hospital performed)     Status: Abnormal   Collection Time: 07/09/16  9:09 AM  Result Value Ref Range   Opiates NONE DETECTED NONE DETECTED   Cocaine POSITIVE (A) NONE DETECTED   Benzodiazepines NONE DETECTED NONE DETECTED   Amphetamines NONE DETECTED NONE DETECTED   Tetrahydrocannabinol NONE DETECTED NONE DETECTED   Barbiturates NONE DETECTED NONE DETECTED    Comment:        DRUG SCREEN FOR MEDICAL PURPOSES ONLY.  IF CONFIRMATION IS NEEDED FOR ANY PURPOSE, NOTIFY LAB WITHIN 5 DAYS.        LOWEST DETECTABLE LIMITS FOR URINE DRUG SCREEN Drug Class       Cutoff (ng/mL) Amphetamine      1000 Barbiturate      200 Benzodiazepine   709 Tricyclics       628 Opiates          300 Cocaine          300 THC              50   I-stat troponin, ED     Status: None   Collection Time: 07/09/16  9:47 AM  Result Value Ref Range   Troponin i, poc 0.00 0.00 - 0.08 ng/mL   Comment 3            Comment: Due to the release kinetics of cTnI, a negative result within the first hours of the onset of symptoms does not rule out myocardial infarction with certainty. If myocardial infarction is still suspected,  repeat the test at appropriate intervals.   Influenza panel by PCR (type A & B)     Status: None   Collection Time: 07/09/16  1:07 PM  Result Value Ref Range   Influenza A By PCR NEGATIVE NEGATIVE    Influenza B By PCR NEGATIVE NEGATIVE    Comment: (NOTE) The Xpert Xpress Flu assay is intended as an aid in the diagnosis of  influenza and should not be used as a sole basis for treatment.  This  assay is FDA approved for nasopharyngeal swab specimens only. Nasal  washings and aspirates are unacceptable for Xpert Xpress Flu testing.   Ethanol     Status: None   Collection Time: 07/09/16  4:24 PM  Result Value Ref Range   Alcohol, Ethyl (B) <5 <5 mg/dL    Comment:        LOWEST DETECTABLE LIMIT FOR SERUM ALCOHOL IS 5 mg/dL FOR MEDICAL PURPOSES ONLY   CBC     Status: Abnormal   Collection Time: 07/09/16  4:24 PM  Result Value Ref Range   WBC 6.0 4.0 - 10.5 K/uL   RBC 4.41 4.22 - 5.81 MIL/uL   Hemoglobin 12.3 (L) 13.0 - 17.0 g/dL   HCT 37.5 (L) 39.0 - 52.0 %   MCV 85.0 78.0 - 100.0 fL   MCH 27.9 26.0 - 34.0 pg   MCHC 32.8 30.0 - 36.0 g/dL   RDW 15.4 11.5 - 15.5 %   Platelets 280 150 - 400 K/uL  Creatinine, serum     Status: None   Collection Time: 07/09/16  4:24 PM  Result Value Ref Range   Creatinine, Ser 1.23 0.61 - 1.24 mg/dL   GFR calc non Af Amer >60 >60 mL/min   GFR calc Af Amer >60 >60 mL/min    Comment: (NOTE) The eGFR has been calculated using the CKD EPI equation. This calculation has not been validated in all clinical situations. eGFR's persistently <60 mL/min signify possible Chronic Kidney Disease.   Procalcitonin - Baseline     Status: None   Collection Time: 07/09/16  4:24 PM  Result Value Ref Range   Procalcitonin <0.10 ng/mL    Comment:        Interpretation: PCT (Procalcitonin) <= 0.5 ng/mL: Systemic infection (sepsis) is not likely. Local bacterial infection is possible. (NOTE)         ICU PCT Algorithm               Non ICU PCT Algorithm    ----------------------------     ------------------------------         PCT < 0.25 ng/mL                 PCT < 0.1 ng/mL     Stopping of antibiotics            Stopping of antibiotics       strongly  encouraged.               strongly encouraged.    ----------------------------     ------------------------------       PCT level decrease by               PCT < 0.25 ng/mL       >= 80% from peak PCT       OR PCT 0.25 - 0.5 ng/mL          Stopping of antibiotics  encouraged.     Stopping of antibiotics           encouraged.    ----------------------------     ------------------------------       PCT level decrease by              PCT >= 0.25 ng/mL       < 80% from peak PCT        AND PCT >= 0.5 ng/mL            Continuin g antibiotics                                              encouraged.       Continuing antibiotics            encouraged.    ----------------------------     ------------------------------     PCT level increase compared          PCT > 0.5 ng/mL         with peak PCT AND          PCT >= 0.5 ng/mL             Escalation of antibiotics                                          strongly encouraged.      Escalation of antibiotics        strongly encouraged.   CBC WITH DIFFERENTIAL     Status: Abnormal   Collection Time: 07/10/16  7:12 AM  Result Value Ref Range   WBC 10.6 (H) 4.0 - 10.5 K/uL   RBC 4.30 4.22 - 5.81 MIL/uL   Hemoglobin 12.0 (L) 13.0 - 17.0 g/dL   HCT 37.0 (L) 39.0 - 52.0 %   MCV 86.0 78.0 - 100.0 fL   MCH 27.9 26.0 - 34.0 pg   MCHC 32.4 30.0 - 36.0 g/dL   RDW 15.7 (H) 11.5 - 15.5 %   Platelets 288 150 - 400 K/uL   Neutrophils Relative % 87 %   Neutro Abs 9.3 (H) 1.7 - 7.7 K/uL   Lymphocytes Relative 8 %   Lymphs Abs 0.9 0.7 - 4.0 K/uL   Monocytes Relative 5 %   Monocytes Absolute 0.5 0.1 - 1.0 K/uL   Eosinophils Relative 0 %   Eosinophils Absolute 0.0 0.0 - 0.7 K/uL   Basophils Relative 0 %   Basophils Absolute 0.0 0.0 - 0.1 K/uL  Basic metabolic panel     Status: Abnormal   Collection Time: 07/10/16  7:12 AM  Result Value Ref Range   Sodium 137 135 - 145 mmol/L   Potassium 4.9 3.5 - 5.1 mmol/L     Comment: NO VISIBLE HEMOLYSIS   Chloride 107 101 - 111 mmol/L   CO2 18 (L) 22 - 32 mmol/L   Glucose, Bld 130 (H) 65 - 99 mg/dL   BUN 9 6 - 20 mg/dL   Creatinine, Ser 0.88 0.61 - 1.24 mg/dL   Calcium 9.6 8.9 - 10.3 mg/dL   GFR calc non Af Amer >60 >60 mL/min   GFR calc Af Amer >60 >60 mL/min    Comment: (NOTE) The eGFR has been calculated using the CKD EPI equation. This calculation  has not been validated in all clinical situations. eGFR's persistently <60 mL/min signify possible Chronic Kidney Disease.    Anion gap 12 5 - 15  Troponin I (q 6hr x 3)     Status: None   Collection Time: 07/10/16 12:52 PM  Result Value Ref Range   Troponin I <0.03 <0.03 ng/mL    Current Facility-Administered Medications  Medication Dose Route Frequency Provider Last Rate Last Dose  . amLODipine (NORVASC) tablet 5 mg  5 mg Oral Daily Brenton Grills, PA-C   5 mg at 07/10/16 0850  . atorvastatin (LIPITOR) tablet 20 mg  20 mg Oral q1800 Brenton Grills, PA-C   20 mg at 07/09/16 1841  . divalproex (DEPAKOTE) DR tablet 500 mg  500 mg Oral BID Brenton Grills, PA-C   500 mg at 07/10/16 0850  . enoxaparin (LOVENOX) injection 40 mg  40 mg Subcutaneous Q24H Marina S [place], PA-C      . famotidine (PEPCID) tablet 10 mg  10 mg Oral BID PRN Brenton Grills, PA-C   10 mg at 07/09/16 2213  . ferrous gluconate (FERGON) tablet 324 mg  324 mg Oral Daily Brenton Grills, PA-C   324 mg at 07/10/16 0849  . fluPHENAZine (PROLIXIN) tablet 10 mg  10 mg Oral Daily Theone Murdoch Hammons, RPH   10 mg at 07/10/16 0850   And  . fluPHENAZine (PROLIXIN) tablet 20 mg  20 mg Oral QHS Kimberly B Hammons, RPH   20 mg at 07/09/16 2212  . folic acid (FOLVITE) tablet 1 mg  1 mg Oral Daily Sharen Hint [place], PA-C   1 mg at 07/10/16 0850  . HYDROcodone-acetaminophen (NORCO/VICODIN) 5-325 MG per tablet 1 tablet  1 tablet Oral Q6H PRN Brenton Grills, PA-C   1 tablet at 07/10/16 0850  . ipratropium-albuterol (DUONEB)  0.5-2.5 (3) MG/3ML nebulizer solution 3 mL  3 mL Nebulization TID Ripudeep K Rai, MD      . levofloxacin (LEVAQUIN) IVPB 750 mg  750 mg Intravenous Q24H Ripudeep K Rai, MD   750 mg at 07/10/16 1623  . loratadine (CLARITIN) tablet 10 mg  10 mg Oral Daily PRN Brenton Grills, PA-C      . LORazepam (ATIVAN) injection 0-4 mg  0-4 mg Intravenous Q6H Brenton Grills, PA-C   2 mg at 07/10/16 1221   Followed by  . [START ON 07/11/2016] LORazepam (ATIVAN) injection 0-4 mg  0-4 mg Intravenous Q12H Brenton Grills, PA-C      . LORazepam (ATIVAN) tablet 1 mg  1 mg Oral Q6H PRN Brenton Grills, PA-C   1 mg at 07/09/16 2209   Or  . LORazepam (ATIVAN) injection 1 mg  1 mg Intravenous Q6H PRN Brenton Grills, PA-C      . methylPREDNISolone sodium succinate (SOLU-MEDROL) 125 mg/2 mL injection 60 mg  60 mg Intravenous Q12H Brenton Grills, PA-C   60 mg at 07/10/16 0849  . morphine 4 MG/ML injection 4 mg  4 mg Intravenous Q1H PRN Tanna Furry, MD   4 mg at 07/09/16 1632  . multivitamin with minerals tablet 1 tablet  1 tablet Oral Daily Brenton Grills, PA-C   1 tablet at 07/10/16 0850  . ondansetron (ZOFRAN) tablet 4 mg  4 mg Oral Q6H PRN Brenton Grills, PA-C       Or  . ondansetron [facility]) injection 4 mg  4 mg Intravenous Q6H PRN Brenton Grills, PA-C      .  pantoprazole (PROTONIX) EC tablet 40 mg  40 mg Oral BID Brenton Grills, PA-C   40 mg at 07/10/16 0849  . polyethylene glycol (MIRALAX / GLYCOLAX) packet 17 g  17 g Oral Daily PRN Brenton Grills, PA-C      . sucralfate (CARAFATE) tablet 1 g  1 g Oral TID WC & HS Brenton Grills, PA-C   1 g at 07/10/16 1623  . thiamine (VITAMIN B-1) tablet 100 mg  100 mg Oral Daily Brenton Grills, PA-C   100 mg at 07/10/16 8032   Or  . thiamine (B-1) injection 100 mg  100 mg Intravenous Daily Brenton Grills, [place]      . [START ON 07/11/2016] Vitamin D (Ergocalciferol) (DRISDOL) capsule 50,000 Units  50,000 Units Oral Weekly Brenton Grills, PA-C      . zolpidem (AMBIEN) tablet 5 mg  5 mg Oral QHS PRN,MR X 1 Marina S [place], PA-C   5 mg at 07/09/16 2208    Musculoskeletal: Strength & Muscle Tone: within normal limits Gait & Station: normal Patient leans: N/A  Psychiatric Specialty Exam: Physical Exam as per history and physical   ROS patient denied nausea, vomiting, abdominal pain and shortness of breath. No Fever-chills, No Headache, No changes with Vision or hearing, reports vertigo No problems swallowing food or Liquids, No Chest pain, Cough or Shortness of Breath, No Abdominal pain, No Nausea or Vommitting, Bowel movements are regular, No Blood in stool or Urine, No dysuria, No new skin rashes or bruises, No new joints pains-aches,  No new weakness, tingling, numbness in any extremity, No recent weight gain or loss, No polyuria, polydypsia or polyphagia,  A full 10 point Review of Systems was done, except as stated above, all other Review of Systems were negative.  Blood pressure 107/65, pulse 70, temperature 97.8 F (36.6 C), resp. rate 18, height 5' 11"  (1.803 m), weight 72.6 kg (160 lb), SpO2 (!) 86 %.Body mass index is 22.32 kg/m.  General Appearance: Guarded  Eye Contact:  Good  Speech:  Clear and Coherent  Volume:  Normal  Mood:  Angry and Depressed  Affect:  Appropriate  Thought Process:  Coherent and Goal Directed  Orientation:  Full (Time, Place, and Person)  Thought Content:  Hallucinations: Auditory and Rumination  Suicidal Thoughts:  Yes.  with intent/plan  Homicidal Thoughts:  Yes.  with intent/plan  Memory:  Immediate;   Good Recent;   Fair Remote;   Fair  Judgement:  Impaired  Insight:  Fair  Psychomotor Activity:  Decreased  Concentration:  Concentration: Fair and Attention Span: Fair  Recall:  [employer] of Knowledge:  Good  Language:  Good  Akathisia:  Negative  Handed:  Right  AIMS (if indicated):     Assets:  Communication Skills Desire for  Improvement Housing Leisure Time Resilience Social Support  ADL's:  Intact  Cognition:  WNL  Sleep:        Treatment Plan Summary: 56 years old male with chronic mental illness especially with schizoaffective disorder/bipolar disorder, substance abuse especially cocaine and alcohol presented with cocaine intoxication, shortness of breath, decreased need for sleep increased agitation and anger and also reports suicidal/homicidal ideation with the plan of using a gun. Patient reported he has an access to the gun which was put away. Patient Brother was on according to him.  Monitor for alcohol withdrawal symptoms and cocaine crash Continue Ativan detox protocol and CIWA protocol Agree with his home medication Depakote  DR 500 mg twice daily for mood swings Prolixin 10 mg daily morning and 20 mg at bedtime for hallucinations and psychosis We'll add Seroquel 100 mg daily and also benztropine 1 mg twice daily for EPS Will ask treated social service to contact and [facility] ACT team fo collateral information and assistance with the disposition plans Daily contact with patient to assess and evaluate symptoms and progress in treatment and Medication management   Appreciate psychiatric consultation and follow up as clinically required Please contact [phone] or [phone] if needs further assistance  Disposition: Recommend psychiatric Inpatient admission when medically cleared. Supportive therapy provided about ongoing stressors.  Ambrose Finland, MD 07/10/2016 5:51 PM

## 2016-07-10 NOTE — Progress Notes (Signed)
[facility]                                                                              Patient Demographics  Francisco Beard, is a 56 y.o. male, DOB - Aug 06, 1960, [id number]  Admit date - 07/09/2016   Admitting Physician Ozella Rocksavid J Merrell, MD  Outpatient Primary MD for the patient is No PCP Per Patient  Outpatient specialists:   LOS - 0  days    Chief Complaint  Patient presents with  . Shortness of Breath  . Chest Pain  . Suicidal  . Homicidal       Brief summary  Francisco Beard is a 56 y.o. male with medical history significant of GI Bleed, Mallory Weiss tears, GERD, CHF, HTN and COPD with asthma, bipolar disorder with hallucinations who presented to the ED with c/o progressive SOB. Onset of symptoms last night and patient was unable to sleep d/t progressive dyspnea and cough. Patient usually uses inhalers, but recently has been out of supply. He has a history of cocaine use and admits to relapsing and using it over the weekend. He is also very stressed and angry repeating that he just realized that the brother killed his mother and he is going to kill his brother. Patient reported auditory and visual hallucinations and voices commanding him to hurt people.    Assessment & Plan    Principal Problem:   Acute exacerbation of COPD   - Placed on scheduled DuoNeb's  - Continue IV steroid, IV Levaquin - Influenza PCR negative  Atypical Chest pain  -  could be associated with cocaine use - Troponins x1 negative so far - Resolved  Bipolar disorder with homicidal ideation - Continue home meds, provide sitter for safety - Psychiatry consulted  Polysubstance abuse - ETOH level is pending, UDS is positive for Cocaine Will start on CIWA protocol - last large alcohol intake  Was last night  GERD with recent history of GI bleed in January treated with Hemoclip - Patient still c/o heartburn and abdominal discomfort after meal - Will increase PPI to bid, continue  sucralfate  Hyperlipidemia  - Continue statin   Code Status: full code   DVT Prophylaxis:  Lovenox  Family Communication: Discussed in detail with the patient, all imaging results, lab results explained to the patient   Disposition Plan:   Time Spent in minutes 25 minutes  Procedures:    Consultants:   Psychiatry   Antimicrobials:   IV Levaquin   Medications  Scheduled Meds: . amLODipine  5 mg Oral Daily  . atorvastatin  20 mg Oral q1800  . divalproex  500 mg Oral BID  . enoxaparin (LOVENOX) injection  40 mg Subcutaneous Q24H  . feeding supplement (ENSURE ENLIVE)  237 mL Oral BID BM  . ferrous gluconate  324 mg Oral Daily  . fluPHENAZine  10 mg Oral Daily   And  . fluPHENAZine  20 mg Oral QHS  . folic acid  1 mg Oral Daily  . ipratropium-albuterol  3 mL Nebulization Q6H  . LORazepam  0-4 mg Intravenous Q6H   Followed by  . [START ON 07/11/2016] LORazepam  0-4 mg Intravenous Q12H  . methylPREDNISolone (SOLU-MEDROL) injection  60 mg Intravenous Q12H  . multivitamin with minerals  1 tablet Oral Daily  . pantoprazole  40 mg Oral BID  . sucralfate  1 g Oral TID WC & HS  . thiamine  100 mg Oral Daily   Or  . thiamine  100 mg Intravenous Daily  . [START ON 07/11/2016] Vitamin D (Ergocalciferol)  50,000 Units Oral Weekly   Continuous Infusions: PRN Meds:.famotidine, HYDROcodone-acetaminophen, loratadine, LORazepam **OR** LORazepam, morphine injection, ondansetron **OR** ondansetron (ZOFRAN) IV, polyethylene glycol, zolpidem   Antibiotics   Anti-infectives    None        Subjective:   Francisco Beard was seen and examined today.Wheezing still, does not like heart healthy diet.  Patient denies dizziness, chest pain,  abdominal pain, N/V/D/C, new weakness, numbess, tingling. No fevers  Objective:   Vitals:   07/09/16 1844 07/09/16 2357 07/10/16 0616 07/10/16 0850  BP: 120/75 108/65 (!) 94/47 109/61  Pulse: 81 62 65   Resp: 18 18 18    Temp: 98.1 F (36.7 C)  98 F (36.7 C) 98.4 F (36.9 C)   TempSrc: Oral Oral Oral   SpO2: 99% 98% 97%   Weight:      Height:       No intake or output data in the 24 hours ending 07/10/16 1229   Wt Readings from Last 3 Encounters:  07/09/16 72.6 kg (160 lb)  05/25/16 81.6 kg (180 lb)  04/17/16 69.6 kg (153 lb 6.4 oz)     Exam  General: Alert and oriented x 3, NAD  HEENT:  PERRLA, EOMI, Anicteric Sclera, mucous membranes moist.   Neck: Supple, no JVD, no masses  Cardiovascular: S1 S2 auscultated, no rubs, murmurs or gallops. Regular rate and rhythm.  Respiratory: Diffuse expiratory wheezing bilaterally  Gastrointestinal: Soft, nontender, nondistended, + bowel sounds  Ext: no cyanosis clubbing or edema  Neuro: AAOx3, Cr N's II- XII. Strength 5/5 upper and lower extremities bilaterally  Skin: No rashes  Psych:  alert and oriented x3    Data Reviewed:  I have personally reviewed following labs and imaging studies  Micro Results No results found for this or any previous visit (from the past 240 hour(s)).  Radiology Reports Dg Chest Port 1 View  Result Date: 07/09/2016 CLINICAL DATA:  Difficulty breathing and chest pain. EXAM: PORTABLE CHEST 1 VIEW COMPARISON:  Single-view of the chest 06/30/2016 and 06/24/2008. CT chest 04/13/2016. FINDINGS: Bullous emphysematous disease is seen. No edema focal airspace disease. No pneumothorax. No pleural effusion. Heart size is normal. IMPRESSION: No acute disease. Bullous emphysema. Electronically Signed   By: Drusilla Kanner M.D.   On: 07/09/2016 08:51    Lab Data:  CBC:  Recent Labs Lab 07/09/16 0902 07/09/16 1624 07/10/16 0712  WBC 4.7 6.0 10.6*  NEUTROABS 2.5  --  9.3*  HGB 12.9* 12.3* 12.0*  HCT 39.0 37.5* 37.0*  MCV 84.1 85.0 86.0  PLT 256 280 288   Basic Metabolic Panel:  Recent Labs Lab 07/09/16 0902 07/09/16 1624 07/10/16 0712  NA 141  --  137  K 3.4*  --  4.9  CL 110  --  107  CO2 21*  --  18*  GLUCOSE 94  --  130*  BUN  10  --  9  CREATININE 1.01 1.23 0.88  CALCIUM 9.0  --  9.6   GFR: Estimated Creatinine Clearance: 95.1 mL/min (by C-G formula based on SCr of 0.88 mg/dL). Liver Function  Tests: No results for input(s): AST, ALT, ALKPHOS, BILITOT, PROT, ALBUMIN in the last 168 hours. No results for input(s): LIPASE, AMYLASE in the last 168 hours. No results for input(s): AMMONIA in the last 168 hours. Coagulation Profile: No results for input(s): INR, PROTIME in the last 168 hours. Cardiac Enzymes: No results for input(s): CKTOTAL, CKMB, CKMBINDEX, TROPONINI in the last 168 hours. BNP (last 3 results) No results for input(s): PROBNP in the last 8760 hours. HbA1C: No results for input(s): HGBA1C in the last 72 hours. CBG: No results for input(s): GLUCAP in the last 168 hours. Lipid Profile: No results for input(s): CHOL, HDL, LDLCALC, TRIG, CHOLHDL, LDLDIRECT in the last 72 hours. Thyroid Function Tests: No results for input(s): TSH, T4TOTAL, FREET4, T3FREE, THYROIDAB in the last 72 hours. Anemia Panel: No results for input(s): VITAMINB12, FOLATE, FERRITIN, TIBC, IRON, RETICCTPCT in the last 72 hours. Urine analysis: No results found for: COLORURINE, APPEARANCEUR, LABSPEC, PHURINE, GLUCOSEU, HGBUR, BILIRUBINUR, KETONESUR, PROTEINUR, UROBILINOGEN, NITRITE, Hurshel Party M.D. [facility] 07/10/2016, 12:29 PM  Pager: [phone] Between 7am to 7pm - call Pager - [phone]  After 7pm go to [email or web address] - password [email or web address]  Call night coverage person covering after 7pm

## 2016-07-10 NOTE — Progress Notes (Signed)
Nutrition Brief Note  Patient identified on the Malnutrition Screening Tool (MST) Report  Wt Readings from Last 15 Encounters:  07/09/16 160 lb (72.6 kg)  05/25/16 180 lb (81.6 kg)  04/17/16 153 lb 6.4 oz (69.6 kg)  04/04/16 166 lb 3.6 oz (75.4 kg)  08/14/13 175 lb 11.2 oz (79.7 kg)  04/28/13 174 lb (78.9 kg)   Francisco Beard is a 56 y.o. male with medical history significant of GI Bleed, Mallory Weiss tears, GERD, CHF, HTN and COPD with asthma, bipolar disorder with hallucinations who presented to the ED with c/o progressive SOB.  Spoke with pt at bedside, who reports great appetite. Noted he consumed 100% of breakfast this AM. Pt reports he lost 20# over the past 2 days, however, this is not consistent with wt hx.   Per sitter at bedside, pt with no difficulty tolerating regular diet.   Nutrition-Focused physical exam completed. Findings are no fat depletion, no muscle depletion, and no edema.   Given wt hx and exam, suspect wt of 180# is an outlier.   Body mass index is 22.32 kg/m. Patient meets criteria for normal weight range based on current BMI.   Current diet order is regular, patient is consuming approximately 100% of meals at this time. Labs and medications reviewed.   No nutrition interventions warranted at this time. If nutrition issues arise, please consult RD.   Kameran Mcneese A. Mayford KnifeWilliams, RD, LDN, CDE Pager: [phone] After hours Pager: [phone]

## 2016-07-11 LAB — CBC
HEMATOCRIT: 37.7 % — AB (ref 39.0–52.0)
HEMOGLOBIN: 12.1 g/dL — AB (ref 13.0–17.0)
MCH: 27.8 pg (ref 26.0–34.0)
MCHC: 32.1 g/dL (ref 30.0–36.0)
MCV: 86.5 fL (ref 78.0–100.0)
Platelets: 313 10*3/uL (ref 150–400)
RBC: 4.36 MIL/uL (ref 4.22–5.81)
RDW: 16.1 % — ABNORMAL HIGH (ref 11.5–15.5)
WBC: 12 10*3/uL — AB (ref 4.0–10.5)

## 2016-07-11 LAB — BASIC METABOLIC PANEL
ANION GAP: 12 (ref 5–15)
BUN: 12 mg/dL (ref 6–20)
CHLORIDE: 105 mmol/L (ref 101–111)
CO2: 21 mmol/L — AB (ref 22–32)
Calcium: 9.6 mg/dL (ref 8.9–10.3)
Creatinine, Ser: 1 mg/dL (ref 0.61–1.24)
GFR calc non Af Amer: >60 mL/min (ref >60)
Glucose, Bld: 128 mg/dL — ABNORMAL HIGH (ref 65–99)
Potassium: 4 mmol/L (ref 3.5–5.1)
Sodium: 138 mmol/L (ref 135–145)

## 2016-07-11 LAB — PROCALCITONIN

## 2016-07-11 LAB — TROPONIN I: Troponin I: <0.03 ng/mL (ref <0.03)

## 2016-07-11 MED ORDER — IPRATROPIUM-ALBUTEROL 0.5-2.5 (3) MG/3ML IN SOLN
3.0000 mL | Freq: Two times a day (BID) | RESPIRATORY_TRACT | Status: DC
  Administered 2016-07-11 – 2016-07-18 (×14): 3 mL via RESPIRATORY_TRACT
  Filled 2016-07-11 (×14): qty 3

## 2016-07-11 NOTE — Progress Notes (Signed)
CSW following for psych placement once patient is medically clear.  Per MD note patient still needing IV medications today for wheezing- CSW will continue to follow and send out referrals once patient is medically stable and off IV medications- per MD note likely tomorrow  Burna SisJenna H. Christabelle Hanzlik, LCSW Clinical Social Worker [phone]

## 2016-07-11 NOTE — Progress Notes (Addendum)
[facility]                                                                              Patient Demographics  Francisco Beard, is a 56 y.o. male, DOB - 02-09-1961, [id number]  Admit date - 07/09/2016   Admitting Physician Ozella Rocks, MD  Outpatient Primary MD for the patient is No PCP Per Patient  Outpatient specialists:   LOS - 1  days    Chief Complaint  Patient presents with  . Shortness of Breath  . Chest Pain  . Suicidal  . Homicidal       Brief summary  Francisco Beard is a 56 y.o. male with medical history significant of GI Bleed, Mallory Weiss tears, GERD, CHF, HTN and COPD with asthma, bipolar disorder with hallucinations who presented to the ED with c/o progressive SOB. Onset of symptoms last night and patient was unable to sleep d/t progressive dyspnea and cough. Patient usually uses inhalers, but recently has been out of supply. He has a history of cocaine use and admits to relapsing and using it over the weekend. He is also very stressed and angry repeating that he just realized that the brother killed his mother and he is going to kill his brother. Patient reported auditory and visual hallucinations and voices commanding him to hurt people.    Assessment & Plan    Principal Problem:   Acute exacerbation of COPD  - still wheezing  - Will continue IV steroid, IV Levaquin - Influenza PCR negative - Continue scheduled labs today -if wheezing improving tomorrow, will transition to oral prednisone and oral Levaquin in am  Atypical Chest pain  -  could be associated with cocaine use - Troponins x1 negative so far - Resolved  Bipolar disorder with homicidal ideation - Continue home meds, provide sitter for safety - Psychiatry consulted  Polysubstance abuse - ETOH level is pending, UDS is positive for Cocaine Will cont on CIWA protocol, currently stable    GERD with recent history of GI bleed in January treated with Hemoclip - Patient  still c/o heartburn and abdominal discomfort after meal - Will increase PPI to bid, continue sucralfate  Hyperlipidemia  - Continue statin   Code Status: full code   DVT Prophylaxis:  Lovenox  Family Communication: Discussed in detail with the patient, all imaging results, lab results explained to the patient   Disposition Plan: Currently medically stable  Time Spent in minutes 25 minutes  Procedures:    Consultants:   Psychiatry   Antimicrobials:   IV Levaquin   Medications  Scheduled Meds: . amLODipine  5 mg Oral Daily  . atorvastatin  20 mg Oral q1800  . benztropine  1 mg Oral BID  . divalproex  500 mg Oral BID  . enoxaparin (LOVENOX) injection  40 mg Subcutaneous Q24H  . ferrous gluconate  324 mg Oral Daily  . fluPHENAZine  10 mg Oral Daily   And  . fluPHENAZine  20 mg Oral QHS  . folic acid  1 mg Oral Daily  . ipratropium-albuterol  3 mL Nebulization BID  . levofloxacin (LEVAQUIN) IV  750 mg  Intravenous Q24H  . LORazepam  0-4 mg Intravenous Q6H   Followed by  . LORazepam  0-4 mg Intravenous Q12H  . methylPREDNISolone (SOLU-MEDROL) injection  60 mg Intravenous Q12H  . multivitamin with minerals  1 tablet Oral Daily  . pantoprazole  40 mg Oral BID  . QUEtiapine  100 mg Oral QHS  . sucralfate  1 g Oral TID WC & HS  . thiamine  100 mg Oral Daily   Or  . thiamine  100 mg Intravenous Daily  . Vitamin D (Ergocalciferol)  50,000 Units Oral Weekly   Continuous Infusions: PRN Meds:.famotidine, HYDROcodone-acetaminophen, loratadine, LORazepam **OR** LORazepam, morphine injection, ondansetron **OR** ondansetron (ZOFRAN) IV, polyethylene glycol, zolpidem   Antibiotics   Anti-infectives    Start     Dose/Rate Route Frequency Ordered Stop   07/10/16 1300  levofloxacin (LEVAQUIN) IVPB 750 mg     750 mg 100 mL/hr over 90 Minutes Intravenous Every 24 hours 07/10/16 1233          Subjective:   Francisco Beard was seen and examined today. Still wheezing today  no fevers and chills.  Patient denies dizziness, chest pain,  abdominal pain, N/V/D/C, new weakness, numbess, tingling. No fevers  Objective:   Vitals:   07/10/16 2031 07/11/16 0500 07/11/16 0731 07/11/16 1030  BP: 106/61 107/61  104/63  Pulse: 89 70    Resp: 16 18    Temp: 98.2 F (36.8 C) 98.2 F (36.8 C)    TempSrc: Oral Oral    SpO2: 97% 97% 92%   Weight:  69.5 kg (153 lb 3.5 oz)    Height:        Intake/Output Summary (Last 24 hours) at 07/11/16 1310 Last data filed at 07/11/16 0900  Gross per 24 hour  Intake              717 ml  Output                0 ml  Net              717 ml     Wt Readings from Last 3 Encounters:  07/11/16 69.5 kg (153 lb 3.5 oz)  05/25/16 81.6 kg (180 lb)  04/17/16 69.6 kg (153 lb 6.4 oz)     Exam  General: Alert and oriented x 3, NAD  HEENT:    Neck:   Cardiovascular: S1 S2 auscultated, no rubs, murmurs or gallops. Regular rate and rhythm.  Respiratory: Bilateral expiratory wheezing  Gastrointestinal: Soft, nontender, nondistended, + bowel sounds  Ext: no cyanosis clubbing or edema  Neuro: no new deficits  Skin: No rashes  Psych:  alert and oriented x3    Data Reviewed:  I have personally reviewed following labs and imaging studies  Micro Results No results found for this or any previous visit (from the past 240 hour(s)).  Radiology Reports Dg Chest Port 1 View  Result Date: 07/09/2016 CLINICAL DATA:  Difficulty breathing and chest pain. EXAM: PORTABLE CHEST 1 VIEW COMPARISON:  Single-view of the chest 06/30/2016 and 06/24/2008. CT chest 04/13/2016. FINDINGS: Bullous emphysematous disease is seen. No edema focal airspace disease. No pneumothorax. No pleural effusion. Heart size is normal. IMPRESSION: No acute disease. Bullous emphysema. Electronically Signed   By: Drusilla Kannerhomas  Dalessio M.D.   On: 07/09/2016 08:51    Lab Data:  CBC:  Recent Labs Lab 07/09/16 0902 07/09/16 1624 07/10/16 0712 07/11/16 0431  WBC 4.7  6.0 10.6* 12.0*  NEUTROABS 2.5  --  9.3*  --   HGB 12.9* 12.3* 12.0* 12.1*  HCT 39.0 37.5* 37.0* 37.7*  MCV 84.1 85.0 86.0 86.5  PLT 256 280 288 313   Basic Metabolic Panel:  Recent Labs Lab 07/09/16 0902 07/09/16 1624 07/10/16 0712 07/11/16 0431  NA 141  --  137 138  K 3.4*  --  4.9 4.0  CL 110  --  107 105  CO2 21*  --  18* 21*  GLUCOSE 94  --  130* 128*  BUN 10  --  9 12  CREATININE 1.01 1.23 0.88 1.00  CALCIUM 9.0  --  9.6 9.6   GFR: Estimated Creatinine Clearance: 80.1 mL/min (by C-G formula based on SCr of 1 mg/dL). Liver Function Tests: No results for input(s): AST, ALT, ALKPHOS, BILITOT, PROT, ALBUMIN in the last 168 hours. No results for input(s): LIPASE, AMYLASE in the last 168 hours. No results for input(s): AMMONIA in the last 168 hours. Coagulation Profile: No results for input(s): INR, PROTIME in the last 168 hours. Cardiac Enzymes:  Recent Labs Lab 07/10/16 1252 07/10/16 1815 07/11/16 0005  TROPONINI <0.03 <0.03 <0.03   BNP (last 3 results) No results for input(s): PROBNP in the last 8760 hours. HbA1C: No results for input(s): HGBA1C in the last 72 hours. CBG: No results for input(s): GLUCAP in the last 168 hours. Lipid Profile: No results for input(s): CHOL, HDL, LDLCALC, TRIG, CHOLHDL, LDLDIRECT in the last 72 hours. Thyroid Function Tests: No results for input(s): TSH, T4TOTAL, FREET4, T3FREE, THYROIDAB in the last 72 hours. Anemia Panel: No results for input(s): VITAMINB12, FOLATE, FERRITIN, TIBC, IRON, RETICCTPCT in the last 72 hours. Urine analysis: No results found for: COLORURINE, APPEARANCEUR, LABSPEC, PHURINE, GLUCOSEU, HGBUR, BILIRUBINUR, KETONESUR, PROTEINUR, UROBILINOGEN, NITRITE, Hurshel Party M.D. [facility] 07/11/2016, 1:10 PM  Pager: [phone] Between 7am to 7pm - call Pager - [phone]  After 7pm go to [email or web address] - password [email or web address]  Call night coverage person covering after 7pm

## 2016-07-11 NOTE — Consult Note (Signed)
[name]   Reason for Consult:  Hallucinations, substance abuse, suicidal/homicidal ideation Referring Physician:  Dr. Tana Coast Patient Identification: Francisco Beard MRN:  [id number] Principal Diagnosis: Acute exacerbation of COPD with asthma [facility]) Diagnosis:   Patient Active Problem List   Diagnosis Date Noted  . Acute exacerbation of COPD with asthma ([place]) [J44.1, C16.606] 07/09/2016  . GERD (gastroesophageal reflux disease) [K21.9] 07/09/2016  . Polysubstance abuse [F19.10] 07/09/2016  . Homicidal ideation [R45.850]   . Bipolar disorder ([place]) [F31.9] 04/17/2016  . HTN (hypertension) [I10] 04/17/2016  . Chest pain [R07.9]   . COPD exacerbation ([place]) [J44.1] 04/13/2016  . COPD (chronic obstructive pulmonary disease) ([place]) [J44.9] 04/13/2016  . Avascular necrosis of bone of right hip ([place]) [M87.051] 04/08/2016  . COPD with acute exacerbation ([place]) [J44.1] 04/01/2016  . Suicidal ideations [R45.851] 08/17/2013    Total Time spent with patient: 1 hour  Subjective:   Francisco Beard is a 56 y.o. male patient admitted with progressive shortness of breath, intoxication with illicit substances.  HPI:  Francisco Beard a 56 y.o.male, seen, chart reviewed for this face-to-face psychiatric consultation and evaluation of increased symptoms of bipolar depression, psychosis, relapsed on drugs of abuse which is cocaine and also reported suicidal/homicidal ideation. Patient stated that he has been living by himself in an apartment and has a friends who is, around with alcohol and cocaine. Patient reported he spent about $400 worth of cocaine and hallucinating and could not sleep before coming to the hospital. Patient also reported he was moved out of his mother's home to an apartment by his brother moved into his mother's home. Reportedly patient stated his mother were shocked by his brother because of conflict regarding money. Patient reportedly was upset, angry and won't kill his  brother but ended up relapsing his drug of abuse which is alcohol and cocaine which makes him more psychotic with hallucinations. Review of his medical records from [facility] indicated patient has been diagnosed with a schizoaffective disorder/bipolar disorder and also more frequently visit the hospital with malingering hallucinations, suicidal/homicidal ideations. Patient usually well within few days and then go home. Patient is requesting to be sent him to [facility] for long-term psychiatric hospitalization. Urine drug screen indicated positive for cocaine and blood alcohol is not significant. Patient does not know what medication he takes but reportedly ACT team is following up with him and acting staff review his medications. This and did not contact his acting members since came to the hospital. She reportedly deceived acting services from [facility]" and reportedly treated by Dr. Gae Bon.  Past Psychiatric History: Schizoaffective disorder, cocaine and alcohol abuse versus dependence and questionable noncompliant with medications and malingering. Patient has multiple acute psychiatric hospitalization at [facility] in his last hospitalization was 05/28/2016.  Interval history: Patient seen for psychiatric consultation follow-up. Patient is compliant with his medication and continue to have a one-to-one sitter for safety as is reporting suicidal and homicidal ideation. Patient is also known for chronic schizoaffective disorder and also polysubstance abuse and malingering behaviors. Patient was restarted on his home medication and has no reported adverse effect of the medication.  Risk to Self: Is patient at risk for suicide?: No (Voices to harm self and others at times) Risk to Others:   Prior Inpatient Therapy:   Prior Outpatient Therapy:    Past Medical History:  Past Medical History:  Diagnosis Date  . Anxiety   . Arthritis    "hips;  bottom part of my back" (07/09/2016)  . Asthmatic bronchitis   . Bipolar disorder ([place])   . CHF (congestive heart failure) ([place])   . Chronic bronchitis ([place])   . Chronic hip pain   . COPD (chronic obstructive pulmonary disease) ([place])   . Depression   . GERD (gastroesophageal reflux disease)   . High cholesterol   . History of hiatal hernia   . Hypertension   . Irregular heart beats   . Pneumonia    "3 times; it's been awhile" (07/09/2016)  . Schizophrenia ([place])   . Type II diabetes mellitus ([place])     Past Surgical History:  Procedure Laterality Date  . CLOSED REDUCTION HAND FRACTURE Right    "got 6 screws and a plate in there"  . ELBOW FRACTURE SURGERY Left    "put it through the wall"  . FRACTURE SURGERY     Family History: History reviewed. No pertinent family history. Family Psychiatric  History: Noncontributory Social History:  History  Alcohol Use  . 12.0 oz/week  . 20 Standard drinks or equivalent per week    Comment: 07/09/2016 "I had quit for 1 yr, relapsed 3 days ago"     History  Drug Use  . Types: "Crack" cocaine    Comment: 07/09/2016 "I had quit for 1 yr, relapsed 3 days ago"    Social History   Social History  . Marital status: Divorced    Spouse name: N/A  . Number of children: N/A  . Years of education: N/A   Social History Main Topics  . Smoking status: Former Smoker    Packs/day: 3.00    Years: 41.00    Types: Cigarettes    Quit date: 04/08/2016  . Smokeless tobacco: Never Used  . Alcohol use 12.0 oz/week    20 Standard drinks or equivalent per week     Comment: 07/09/2016 "I had quit for 1 yr, relapsed 3 days ago"  . Drug use: Yes    Types: "Crack" cocaine     Comment: 07/09/2016 "I had quit for 1 yr, relapsed 3 days ago"  . Sexual activity: Not Currently   Other Topics Concern  . None   Social History Narrative  . None   Additional Social History:    Allergies:   Allergies  Allergen Reactions  . Strawberry Extract Anaphylaxis  .  Tomato Anaphylaxis  . Aleve [Naproxen] Other (See Comments)    Heart problems so MD told him to not take  . Aspirin Swelling  . Ibuprofen Swelling  . Penicillins Swelling    Has patient had a PCN reaction causing immediate rash, facial/tongue/throat swelling, SOB or lightheadedness with hypotension: Yes Has patient had a PCN reaction causing severe rash involving mucus membranes or skin necrosis: Yes Has patient had a PCN reaction that required hospitalization: Yes Has patient had a PCN reaction occurring within the last 10 years: No If all of the above answers are "NO", then may proceed with Cephalosporin use.   . Seroquel [Quetiapine] Other (See Comments)    In higher doses, this causes excessive sedation  . Onion Nausea And Vomiting and Rash    Labs:  Results for orders placed or performed during the hospital encounter of 07/09/16 (from the past 48 hour(s))  CBC WITH DIFFERENTIAL     Status: Abnormal   Collection Time: 07/10/16  7:12 AM  Result Value Ref Range   WBC 10.6 (H) 4.0 - 10.5 K/uL   RBC 4.30 4.22 - 5.81 MIL/uL  Hemoglobin 12.0 (L) 13.0 - 17.0 g/dL   HCT 37.0 (L) 39.0 - 52.0 %   MCV 86.0 78.0 - 100.0 fL   MCH 27.9 26.0 - 34.0 pg   MCHC 32.4 30.0 - 36.0 g/dL   RDW 15.7 (H) 11.5 - 15.5 %   Platelets 288 150 - 400 K/uL   Neutrophils Relative % 87 %   Neutro Abs 9.3 (H) 1.7 - 7.7 K/uL   Lymphocytes Relative 8 %   Lymphs Abs 0.9 0.7 - 4.0 K/uL   Monocytes Relative 5 %   Monocytes Absolute 0.5 0.1 - 1.0 K/uL   Eosinophils Relative 0 %   Eosinophils Absolute 0.0 0.0 - 0.7 K/uL   Basophils Relative 0 %   Basophils Absolute 0.0 0.0 - 0.1 K/uL  Basic metabolic panel     Status: Abnormal   Collection Time: 07/10/16  7:12 AM  Result Value Ref Range   Sodium 137 135 - 145 mmol/L   Potassium 4.9 3.5 - 5.1 mmol/L    Comment: NO VISIBLE HEMOLYSIS   Chloride 107 101 - 111 mmol/L   CO2 18 (L) 22 - 32 mmol/L   Glucose, Bld 130 (H) 65 - 99 mg/dL   BUN 9 6 - 20 mg/dL    Creatinine, Ser 0.88 0.61 - 1.24 mg/dL   Calcium 9.6 8.9 - 10.3 mg/dL   GFR calc non Af Amer >60 >60 mL/min   GFR calc Af Amer >60 >60 mL/min    Comment: (NOTE) The eGFR has been calculated using the CKD EPI equation. This calculation has not been validated in all clinical situations. eGFR's persistently <60 mL/min signify possible Chronic Kidney Disease.    Anion gap 12 5 - 15  Troponin I (q 6hr x 3)     Status: None   Collection Time: 07/10/16 12:52 PM  Result Value Ref Range   Troponin I <0.03 <0.03 ng/mL  Troponin I (q 6hr x 3)     Status: None   Collection Time: 07/10/16  6:15 PM  Result Value Ref Range   Troponin I <0.03 <0.03 ng/mL  Troponin I (q 6hr x 3)     Status: None   Collection Time: 07/11/16 12:05 AM  Result Value Ref Range   Troponin I <0.03 <0.03 ng/mL  Procalcitonin     Status: None   Collection Time: 07/11/16  4:31 AM  Result Value Ref Range   Procalcitonin <0.10 ng/mL    Comment:        Interpretation: PCT (Procalcitonin) <= 0.5 ng/mL: Systemic infection (sepsis) is not likely. Local bacterial infection is possible. REPEATED TO VERIFY (NOTE)         ICU PCT Algorithm               Non ICU PCT Algorithm    ----------------------------     ------------------------------         PCT < 0.25 ng/mL                 PCT < 0.1 ng/mL     Stopping of antibiotics            Stopping of antibiotics       strongly encouraged.               strongly encouraged.    ----------------------------     ------------------------------       PCT level decrease by               PCT <  0.25 ng/mL       >= 80% from peak PCT       OR PCT 0.25 - 0.5 ng/mL          Stopping of antibiotics                                             encouraged.     Stopping of antibiotics           encouraged.    ----------------------------     ------------------------------       PCT level decrease by              PCT >= 0.25 ng/mL       < 80% from peak PCT        AND PCT >= 0.5 ng/mL              Continuing antibiotics                                              encouraged.       Continuing antibiotics            encouraged.    ----------------------------     ------------------------------     PCT level increase compared          PCT > 0.5 ng/mL         with peak PCT AND          PCT >= 0.5 ng/mL             Escalation of antibiotics                                          strongly encouraged.      Escalation of antibiotics        strongly encouraged.   CBC     Status: Abnormal   Collection Time: 07/11/16  4:31 AM  Result Value Ref Range   WBC 12.0 (H) 4.0 - 10.5 K/uL   RBC 4.36 4.22 - 5.81 MIL/uL   Hemoglobin 12.1 (L) 13.0 - 17.0 g/dL   HCT 37.7 (L) 39.0 - 52.0 %   MCV 86.5 78.0 - 100.0 fL   MCH 27.8 26.0 - 34.0 pg   MCHC 32.1 30.0 - 36.0 g/dL   RDW 16.1 (H) 11.5 - 15.5 %   Platelets 313 150 - 400 K/uL  Basic metabolic panel     Status: Abnormal   Collection Time: 07/11/16  4:31 AM  Result Value Ref Range   Sodium 138 135 - 145 mmol/L   Potassium 4.0 3.5 - 5.1 mmol/L    Comment: DELTA CHECK NOTED   Chloride 105 101 - 111 mmol/L   CO2 21 (L) 22 - 32 mmol/L   Glucose, Bld 128 (H) 65 - 99 mg/dL   BUN 12 6 - 20 mg/dL   Creatinine, Ser 1.00 0.61 - 1.24 mg/dL   Calcium 9.6 8.9 - 10.3 mg/dL   GFR calc non Af Amer >60 >60 mL/min   GFR calc Af Amer >60 >60 mL/min    Comment: (NOTE) The eGFR has been calculated using the CKD EPI equation.  This calculation has not been validated in all clinical situations. eGFR's persistently <60 mL/min signify possible Chronic Kidney Disease.    Anion gap 12 5 - 15    Current Facility-Administered Medications  Medication Dose Route Frequency Provider Last Rate Last Dose  . amLODipine (NORVASC) tablet 5 mg  5 mg Oral Daily Brenton Grills, PA-C   5 mg at 07/11/16 1030  . atorvastatin (LIPITOR) tablet 20 mg  20 mg Oral q1800 Brenton Grills, PA-C   20 mg at 07/10/16 2025  . benztropine (COGENTIN) tablet 1 mg  1 mg Oral BID  Ambrose Finland, MD   1 mg at 07/11/16 1033  . divalproex (DEPAKOTE) DR tablet 500 mg  500 mg Oral BID Brenton Grills, PA-C   500 mg at 07/11/16 1031  . enoxaparin (LOVENOX) injection 40 mg  40 mg Subcutaneous Q24H Marina S [place], PA-C      . famotidine (PEPCID) tablet 10 mg  10 mg Oral BID PRN Brenton Grills, PA-C   10 mg at 07/09/16 2213  . ferrous gluconate (FERGON) tablet 324 mg  324 mg Oral Daily Brenton Grills, PA-C   324 mg at 07/11/16 1028  . fluPHENAZine (PROLIXIN) tablet 10 mg  10 mg Oral Daily Theone Murdoch Hammons, RPH   10 mg at 07/11/16 1032   And  . fluPHENAZine (PROLIXIN) tablet 20 mg  20 mg Oral QHS Kimberly B Hammons, RPH   20 mg at 07/10/16 2356  . folic acid (FOLVITE) tablet 1 mg  1 mg Oral Daily Sharen Hint [place], PA-C   1 mg at 07/11/16 1029  . HYDROcodone-acetaminophen (NORCO/VICODIN) 5-325 MG per tablet 1 tablet  1 tablet Oral Q6H PRN Brenton Grills, PA-C   1 tablet at 07/11/16 1040  . ipratropium-albuterol (DUONEB) 0.5-2.5 (3) MG/3ML nebulizer solution 3 mL  3 mL Nebulization BID Ripudeep K Rai, MD      . levofloxacin (LEVAQUIN) IVPB 750 mg  750 mg Intravenous Q24H Ripudeep K Rai, MD   750 mg at 07/11/16 1419  . loratadine (CLARITIN) tablet 10 mg  10 mg Oral Daily PRN Brenton Grills, PA-C      . LORazepam (ATIVAN) injection 0-4 mg  0-4 mg Intravenous Q12H Brenton Grills, PA-C      . LORazepam (ATIVAN) tablet 1 mg  1 mg Oral Q6H PRN Brenton Grills, PA-C   1 mg at 07/09/16 2209   Or  . LORazepam (ATIVAN) injection 1 mg  1 mg Intravenous Q6H PRN Brenton Grills, PA-C      . methylPREDNISolone sodium succinate (SOLU-MEDROL) 125 mg/2 mL injection 60 mg  60 mg Intravenous Q12H Brenton Grills, PA-C   60 mg at 07/11/16 1029  . morphine 4 MG/ML injection 4 mg  4 mg Intravenous Q1H PRN Tanna Furry, MD   4 mg at 07/09/16 1632  . multivitamin with minerals tablet 1 tablet  1 tablet Oral Daily Brenton Grills, PA-C   1 tablet at 07/11/16  1029  . ondansetron (ZOFRAN) tablet 4 mg  4 mg Oral Q6H PRN Brenton Grills, PA-C       Or  . ondansetron [facility]) injection 4 mg  4 mg Intravenous Q6H PRN Brenton Grills, PA-C      . pantoprazole (PROTONIX) EC tablet 40 mg  40 mg Oral BID Brenton Grills, PA-C   40 mg at 07/11/16 1030  . polyethylene glycol (MIRALAX / GLYCOLAX) packet 17 g  17 g  Oral Daily PRN Brenton Grills, PA-C      . QUEtiapine (SEROQUEL) tablet 100 mg  100 mg Oral QHS Ambrose Finland, MD   100 mg at 07/10/16 2358  . sucralfate (CARAFATE) tablet 1 g  1 g Oral TID WC & HS Brenton Grills, PA-C   1 g at 07/11/16 1418  . thiamine (VITAMIN B-1) tablet 100 mg  100 mg Oral Daily Brenton Grills, PA-C   100 mg at 07/11/16 1029   Or  . thiamine (B-1) injection 100 mg  100 mg Intravenous Daily Brenton Grills, PA-C      . Vitamin D (Ergocalciferol) (DRISDOL) capsule 50,000 Units  50,000 Units Oral Weekly Brenton Grills, PA-C   50,000 Units at 07/11/16 1031  . zolpidem (AMBIEN) tablet 5 mg  5 mg Oral QHS PRN,MR X 1 Marina S [place], PA-C   5 mg at 07/09/16 2208    Musculoskeletal: Strength & Muscle Tone: within normal limits Gait & Station: normal Patient leans: N/A  Psychiatric Specialty Exam: Physical Exam as per history and physical   ROS patient denied nausea, vomiting, abdominal pain and shortness of breath. No Fever-chills, No Headache, No changes with Vision or hearing, reports vertigo No problems swallowing food or Liquids, No Chest pain, Cough or Shortness of Breath, No Abdominal pain, No Nausea or Vommitting, Bowel movements are regular, No Blood in stool or Urine, No dysuria, No new skin rashes or bruises, No new joints pains-aches,  No new weakness, tingling, numbness in any extremity, No recent weight gain or loss, No polyuria, polydypsia or polyphagia,  A full 10 point Review of Systems was done, except as stated above, all other Review of Systems were negative.   Blood pressure 104/63, pulse 70, temperature 98.2 F (36.8 C), temperature source Oral, resp. rate 18, height 5' 11" (1.803 m), weight 69.5 kg (153 lb 3.5 oz), SpO2 92 %.Body mass index is 21.37 kg/m.  General Appearance: Guarded  Eye Contact:  Good  Speech:  Clear and Coherent  Volume:  Normal  Mood:  Angry and Depressed  Affect:  Appropriate  Thought Process:  Coherent and Goal Directed  Orientation:  Full (Time, Place, and Person)  Thought Content:  Hallucinations: Auditory and Rumination  Suicidal Thoughts:  Yes.  with intent/plan  Homicidal Thoughts:  Yes.  with intent/plan  Memory:  Immediate;   Good Recent;   Fair Remote;   Fair  Judgement:  Impaired  Insight:  Fair  Psychomotor Activity:  Decreased  Concentration:  Concentration: Fair and Attention Span: Fair  Recall:  [employer] of Knowledge:  Good  Language:  Good  Akathisia:  Negative  Handed:  Right  AIMS (if indicated):     Assets:  Communication Skills Desire for Improvement Housing Leisure Time Resilience Social Support  ADL's:  Intact  Cognition:  WNL  Sleep:        Treatment Plan Summary: 56 years old male with chronic mental illness especially with schizoaffective disorder/bipolar disorder, substance abuse especially cocaine and alcohol presented with cocaine intoxication, shortness of breath, decreased need for sleep increased agitation and anger and also reports suicidal/homicidal ideation with the plan of using a gun. Patient reported he has an access to the gun which was put away. Patient Brother was on according to him.  Monitor for alcohol withdrawal symptoms and cocaine crash Continue Ativan detox protocol and CIWA protocol Depakote DR 500 mg twice daily for mood swings Prolixin 10 mg daily morning and 20  mg at bedtime for hallucinations and psychosis We'll add Seroquel 100 mg daily and also benztropine 1 mg twice daily for EPS Will ask treated social service to contact and [facility] ACT  team fo collateral information and assistance with the disposition plans Daily contact with patient to assess and evaluate symptoms and progress in treatment and Medication management   Appreciate psychiatric consultation and follow up as clinically required Please contact [phone] or [phone] if needs further assistance  Disposition: Recommend psychiatric Inpatient admission when medically cleared. Supportive therapy provided about ongoing stressors.  Ambrose Finland, MD 07/11/2016 5:17 PM

## 2016-07-11 NOTE — Care Management Note (Addendum)
Case Management Note  Patient Details  Name: Francisco Beard MRN: [id number] Date of Birth: 1961/01/13  Subjective/Objective:                 Patient admitted with resp distress, homicidal suicidal ideations, psych consult [facility] rec behavioral hospital when medically clear.   Addendum 07/24/16 Francisco Jayde Mcallister RN CM Notified by Dr Janee Mornhompson that patient will DC tonight. Spoke with patient at the bedside. He provided number for [facility] and called in my presence. [phone] Attempt to make appointment but scheduler not available. Patient states he will call in the morning to see Dr Areatha KeasBerani, his psychiatrist. Patient also called [place] from [facility], to verify that he would be able to provide transport home. CM spoke with Francisco RuizJohn over the phone while at bedside, confirmed he would be here in an hour. CM updated Dr Janee Mornhompson and bedside RN about DC plan.    Action/Plan:  DC to home.  Expected Discharge Date:                  Expected Discharge Plan:  Psychiatric Hospital  In-House Referral:  Clinical Social Work  Discharge planning Services  CM Consult  Post Acute Care Choice:    Choice offered to:     DME Arranged:    DME Agency:     HH Arranged:    HH Agency:     Status of Service:  In process, will continue to follow  If discussed at Long Length of Stay Meetings, dates discussed:    Additional Comments:  Francisco SabalDebbie Doaa Kendzierski, RN 07/11/2016, 4:14 PM

## 2016-07-12 LAB — CBC
HCT: 35.8 % — ABNORMAL LOW (ref 39.0–52.0)
Hemoglobin: 11.1 g/dL — ABNORMAL LOW (ref 13.0–17.0)
MCH: 27 pg (ref 26.0–34.0)
MCHC: 31 g/dL (ref 30.0–36.0)
MCV: 87.1 fL (ref 78.0–100.0)
PLATELETS: 306 10*3/uL (ref 150–400)
RBC: 4.11 MIL/uL — AB (ref 4.22–5.81)
RDW: 16.3 % — ABNORMAL HIGH (ref 11.5–15.5)
WBC: 11 10*3/uL — ABNORMAL HIGH (ref 4.0–10.5)

## 2016-07-12 LAB — BASIC METABOLIC PANEL
Anion gap: 12 (ref 5–15)
BUN: 13 mg/dL (ref 6–20)
CO2: 20 mmol/L — ABNORMAL LOW (ref 22–32)
CREATININE: 1.01 mg/dL (ref 0.61–1.24)
Calcium: 9.1 mg/dL (ref 8.9–10.3)
Chloride: 105 mmol/L (ref 101–111)
GFR calc Af Amer: >60 mL/min (ref >60)
GLUCOSE: 201 mg/dL — AB (ref 65–99)
POTASSIUM: 3.7 mmol/L (ref 3.5–5.1)
Sodium: 137 mmol/L (ref 135–145)

## 2016-07-12 MED ORDER — LEVOFLOXACIN 750 MG PO TABS
750.0000 mg | ORAL_TABLET | Freq: Every day | ORAL | Status: AC
  Administered 2016-07-12 – 2016-07-16 (×5): 750 mg via ORAL
  Filled 2016-07-12 (×5): qty 1

## 2016-07-12 MED ORDER — PREDNISONE 50 MG PO TABS
60.0000 mg | ORAL_TABLET | Freq: Every day | ORAL | Status: DC
  Administered 2016-07-13 – 2016-07-15 (×3): 60 mg via ORAL
  Filled 2016-07-12 (×3): qty 1

## 2016-07-12 NOTE — Progress Notes (Signed)
[facility]                                                                              Patient Demographics  Francisco Beard, is a 56 y.o. male, DOB - [birth year], ZOX:[id number]  Admit date - 07/09/2016   Admitting Physician Ozella Rocks, MD  Outpatient Primary MD for the patient is No PCP Per Patient  Outpatient specialists:   LOS - 2  days    Chief Complaint  Patient presents with  . Shortness of Breath  . Chest Pain  . Suicidal  . Homicidal       Brief summary  Francisco Beard is a 56 y.o. male with medical history significant of GI Bleed, Mallory Weiss tears, GERD, CHF, HTN and COPD with asthma, bipolar disorder with hallucinations who presented to the ED with c/o progressive SOB. Onset of symptoms last night and patient was unable to sleep d/t progressive dyspnea and cough. Patient usually uses inhalers, but recently has been out of supply. He has a history of cocaine use and admits to relapsing and using it over the weekend. He is also very stressed and angry repeating that he just realized that the brother killed his mother and he is going to kill his brother. Patient reported auditory and visual hallucinations and voices commanding him to hurt people.    Assessment & Plan    Principal Problem:   Acute exacerbation of COPD  - Improving, transitioned to oral Levaquin and oral prednisone today  -  Influenza PCR negative - Continue scheduled nebs   Atypical Chest pain  -  could be associated with cocaine use -Serial troponins negative so far   Bipolar disorder with homicidal ideation - Continue home meds, provide sitter for safety - Appreciate psychiatry recommendations  Polysubstance abuse - ETOH level is pending, UDS is positive for Cocaine Will cont on CIWA protocol, currently stable    GERD with recent history of GI bleed in January treated with Hemoclip - Patient still c/o heartburn and abdominal discomfort after meal - cont PPI  bid,  continue sucralfate  Hyperlipidemia  - Continue statin   Code Status: full code   DVT Prophylaxis:  Lovenox  Family Communication: Discussed in detail with the patient, all imaging results, lab results explained to the patient   Disposition Plan: Currently medically stable, awaiting psych facility bed  Time Spent in minutes 25 minutes  Procedures:    Consultants:   Psychiatry   Antimicrobials:   IV Levaquin   Medications  Scheduled Meds: . amLODipine  5 mg Oral Daily  . atorvastatin  20 mg Oral q1800  . benztropine  1 mg Oral BID  . divalproex  500 mg Oral BID  . enoxaparin (LOVENOX) injection  40 mg Subcutaneous Q24H  . ferrous gluconate  324 mg Oral Daily  . fluPHENAZine  10 mg Oral Daily   And  . fluPHENAZine  20 mg Oral QHS  . folic acid  1 mg Oral Daily  . ipratropium-albuterol  3 mL Nebulization BID  . levofloxacin  750 mg Oral Daily  . LORazepam  0-4 mg Intravenous Q12H  . multivitamin  with minerals  1 tablet Oral Daily  . pantoprazole  40 mg Oral BID  . predniSONE  60 mg Oral Q breakfast  . QUEtiapine  100 mg Oral QHS  . sucralfate  1 g Oral TID WC & HS  . thiamine  100 mg Oral Daily  . Vitamin D (Ergocalciferol)  50,000 Units Oral Weekly   Continuous Infusions: PRN Meds:.famotidine, HYDROcodone-acetaminophen, loratadine, LORazepam **OR** LORazepam, morphine injection, ondansetron **OR** ondansetron (ZOFRAN) IV, polyethylene glycol, zolpidem   Antibiotics   Anti-infectives    Start     Dose/Rate Route Frequency Ordered Stop   07/12/16 1000  levofloxacin (LEVAQUIN) tablet 750 mg     750 mg Oral Daily 07/12/16 0901     07/10/16 1300  levofloxacin (LEVAQUIN) IVPB 750 mg  Status:  Discontinued     750 mg 100 mL/hr over 90 Minutes Intravenous Every 24 hours 07/10/16 1233 07/12/16 0901        Subjective:   Francisco Beard was seen and examined today. Wheezing is improving, no fevers and chills. Feels better.  Patient denies dizziness, chest pain,   abdominal pain, N/V/D/C, new weakness, numbess, tingling.   Objective:   Vitals:   07/12/16 0654 07/12/16 0655 07/12/16 0820 07/12/16 0900  BP: (!) 96/52 (!) 108/59  (!) 113/51  Pulse:  74  68  Resp: 18     Temp: 98.4 F (36.9 C)     TempSrc: Oral     SpO2:   98%   Weight:  73 kg (161 lb)    Height:        Intake/Output Summary (Last 24 hours) at 07/12/16 1202 Last data filed at 07/12/16 4098  Gross per 24 hour  Intake             1440 ml  Output              300 ml  Net             1140 ml     Wt Readings from Last 3 Encounters:  07/12/16 73 kg (161 lb)  05/25/16 81.6 kg (180 lb)  04/17/16 69.6 kg (153 lb 6.4 oz)     Exam  General: Alert and oriented x 3, NAD  HEENT:    Neck:   Cardiovascular: S1 S2 clear   Respiratory:  Wheezing a lot better  Gastrointestinal: Soft, nontender, nondistended, + bowel sounds  Ext: no cyanosis clubbing or edema  Neuro: no new deficits  Skin: No rashes  Psych:  alert and oriented x3    Data Reviewed:  I have personally reviewed following labs and imaging studies  Micro Results No results found for this or any previous visit (from the past 240 hour(s)).  Radiology Reports Dg Chest Port 1 View  Result Date: 07/09/2016 CLINICAL DATA:  Difficulty breathing and chest pain. EXAM: PORTABLE CHEST 1 VIEW COMPARISON:  Single-view of the chest 06/30/2016 and 06/24/2008. CT chest 04/13/2016. FINDINGS: Bullous emphysematous disease is seen. No edema focal airspace disease. No pneumothorax. No pleural effusion. Heart size is normal. IMPRESSION: No acute disease. Bullous emphysema. Electronically Signed   By: Drusilla Kanner M.D.   On: 07/09/2016 08:51    Lab Data:  CBC:  Recent Labs Lab 07/09/16 0902 07/09/16 1624 07/10/16 0712 07/11/16 0431 07/12/16 0528  WBC 4.7 6.0 10.6* 12.0* 11.0*  NEUTROABS 2.5  --  9.3*  --   --   HGB 12.9* 12.3* 12.0* 12.1* 11.1*  HCT 39.0 37.5* 37.0* 37.7* 35.8*  MCV 84.1 85.0 86.0 86.5 87.1    PLT 256 280 288 313 306   Basic Metabolic Panel:  Recent Labs Lab 07/09/16 0902 07/09/16 1624 07/10/16 0712 07/11/16 0431 07/12/16 0528  NA 141  --  137 138 137  K 3.4*  --  4.9 4.0 3.7  CL 110  --  107 105 105  CO2 21*  --  18* 21* 20*  GLUCOSE 94  --  130* 128* 201*  BUN 10  --  9 12 13   CREATININE 1.01 1.23 0.88 1.00 1.01  CALCIUM 9.0  --  9.6 9.6 9.1   GFR: Estimated Creatinine Clearance: 83.3 mL/min (by C-G formula based on SCr of 1.01 mg/dL). Liver Function Tests: No results for input(s): AST, ALT, ALKPHOS, BILITOT, PROT, ALBUMIN in the last 168 hours. No results for input(s): LIPASE, AMYLASE in the last 168 hours. No results for input(s): AMMONIA in the last 168 hours. Coagulation Profile: No results for input(s): INR, PROTIME in the last 168 hours. Cardiac Enzymes:  Recent Labs Lab 07/10/16 1252 07/10/16 1815 07/11/16 0005  TROPONINI <0.03 <0.03 <0.03   BNP (last 3 results) No results for input(s): PROBNP in the last 8760 hours. HbA1C: No results for input(s): HGBA1C in the last 72 hours. CBG: No results for input(s): GLUCAP in the last 168 hours. Lipid Profile: No results for input(s): CHOL, HDL, LDLCALC, TRIG, CHOLHDL, LDLDIRECT in the last 72 hours. Thyroid Function Tests: No results for input(s): TSH, T4TOTAL, FREET4, T3FREE, THYROIDAB in the last 72 hours. Anemia Panel: No results for input(s): VITAMINB12, FOLATE, FERRITIN, TIBC, IRON, RETICCTPCT in the last 72 hours. Urine analysis: No results found for: COLORURINE, APPEARANCEUR, LABSPEC, PHURINE, GLUCOSEU, HGBUR, BILIRUBINUR, KETONESUR, PROTEINUR, UROBILINOGEN, NITRITE, Hurshel PartyLEUKOCYTESUR   RAI,RIPUDEEP M.D. [facility] 07/12/2016, 12:02 PM  Pager: [phone] Between 7am to 7pm - call Pager - [phone]  After 7pm go to [email or web address] - password [email or web address]  Call night coverage person covering after 7pm

## 2016-07-12 NOTE — Clinical Social Work Note (Signed)
Referrals made to [facility] [phone] or [phone])and [facility] [phone] or [phone]). [facility] is reviewing referral, however space is limited. [facility], and was advised to call back in the morning. Will follow up in the morning.   Dede QuerySarah Verdis Koval, MSW, LCSW  Clinical Social Worker  [phone]

## 2016-07-12 NOTE — Progress Notes (Signed)
   07/12/16 1015  Clinical Encounter Type  Visited With Patient  Visit Type Follow-up  Spiritual Encounters  Spiritual Needs Emotional;Literature  Stress Factors  Patient Stress Factors None identified  Introduction to Pt. Pt requested a Bible. Brought bible to Pt and provided ministry of presence.
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
## 2016-07-13 LAB — CBC
HCT: 36.6 % — ABNORMAL LOW (ref 39.0–52.0)
Hemoglobin: 11.6 g/dL — ABNORMAL LOW (ref 13.0–17.0)
MCH: 27.6 pg (ref 26.0–34.0)
MCHC: 31.7 g/dL (ref 30.0–36.0)
MCV: 87.1 fL (ref 78.0–100.0)
PLATELETS: 257 10*3/uL (ref 150–400)
RBC: 4.2 MIL/uL — AB (ref 4.22–5.81)
RDW: 16.2 % — ABNORMAL HIGH (ref 11.5–15.5)
WBC: 7.6 10*3/uL (ref 4.0–10.5)

## 2016-07-13 LAB — BASIC METABOLIC PANEL
Anion gap: 7 (ref 5–15)
BUN: 11 mg/dL (ref 6–20)
CO2: 22 mmol/L (ref 22–32)
Calcium: 8.8 mg/dL — ABNORMAL LOW (ref 8.9–10.3)
Chloride: 112 mmol/L — ABNORMAL HIGH (ref 101–111)
Creatinine, Ser: 1.13 mg/dL (ref 0.61–1.24)
GFR calc Af Amer: >60 mL/min (ref >60)
GLUCOSE: 81 mg/dL (ref 65–99)
POTASSIUM: 3.3 mmol/L — AB (ref 3.5–5.1)
Sodium: 141 mmol/L (ref 135–145)

## 2016-07-13 LAB — PROCALCITONIN: Procalcitonin: <0.1 ng/mL

## 2016-07-13 NOTE — Progress Notes (Signed)
[facility]                                                                              Patient Demographics  Rollan Roger, is a 56 y.o. male, DOB - 12/13/1960, ZOX:[id number]  Admit date - 07/09/2016   Admitting Physician Ozella Rocks, MD  Outpatient Primary MD for the patient is No PCP Per Patient  Outpatient specialists:   LOS - 3  days    Chief Complaint  Patient presents with  . Shortness of Breath  . Chest Pain  . Suicidal  . Homicidal       Brief summary  Garlen Reinig is a 56 y.o. male with medical history significant of GI Bleed, Mallory Weiss tears, GERD, CHF, HTN and COPD with asthma, bipolar disorder with hallucinations who presented to the ED with c/o progressive SOB. Onset of symptoms last night and patient was unable to sleep d/t progressive dyspnea and cough. Patient usually uses inhalers, but recently has been out of supply. He has a history of cocaine use and admits to relapsing and using it over the weekend. He is also very stressed and angry repeating that he just realized that the brother killed his mother and he is going to kill his brother. Patient reported auditory and visual hallucinations and voices commanding him to hurt people.    Assessment & Plan    Principal Problem:   Acute exacerbation of COPD  - Improving, transitioned to oral Levaquin and oral prednisone   -  Influenza PCR negative - Continue scheduled Albuterol labs  Atypical Chest pain  -  could be associated with cocaine use -Serial troponins negative so far   Bipolar disorder, schizoaffective disorder with active homicidal ideation  - Continue home meds, provide sitter for safety - Appreciate psychiatry recommendations - Patient continues to have homicidal ideations  Polysubstance abuse - ETOH level is pending, UDS is positive for Cocaine Will cont on CIWA protocol with ativan, currently stable    GERD with recent history of GI bleed in January treated with  Hemoclip - Patient still c/o heartburn and abdominal discomfort after meal - cont PPI  bid, continue sucralfate  Hyperlipidemia  - Continue statin   Code Status: full code   DVT Prophylaxis:  Lovenox  Family Communication: Discussed in detail with the patient, all imaging results, lab results explained to the patient   Disposition Plan: Currently medically stable, awaiting psych facility bed  Time Spent in minutes 25 minutes  Procedures:    Consultants:   Psychiatry   Antimicrobials:   IV Levaquin   Medications  Scheduled Meds: . amLODipine  5 mg Oral Daily  . atorvastatin  20 mg Oral q1800  . benztropine  1 mg Oral BID  . divalproex  500 mg Oral BID  . enoxaparin (LOVENOX) injection  40 mg Subcutaneous Q24H  . ferrous gluconate  324 mg Oral Daily  . fluPHENAZine  10 mg Oral Daily   And  . fluPHENAZine  20 mg Oral QHS  . folic acid  1 mg Oral Daily  . ipratropium-albuterol  3 mL Nebulization BID  . levofloxacin  750 mg  Oral Daily  . LORazepam  0-4 mg Intravenous Q12H  . multivitamin with minerals  1 tablet Oral Daily  . pantoprazole  40 mg Oral BID  . predniSONE  60 mg Oral Q breakfast  . QUEtiapine  100 mg Oral QHS  . sucralfate  1 g Oral TID WC & HS  . thiamine  100 mg Oral Daily  . Vitamin D (Ergocalciferol)  50,000 Units Oral Weekly   Continuous Infusions: PRN Meds:.famotidine, HYDROcodone-acetaminophen, loratadine, morphine injection, ondansetron **OR** ondansetron (ZOFRAN) IV, polyethylene glycol, zolpidem   Antibiotics   Anti-infectives    Start     Dose/Rate Route Frequency Ordered Stop   07/12/16 1000  levofloxacin (LEVAQUIN) tablet 750 mg     750 mg Oral Daily 07/12/16 0901     07/10/16 1300  levofloxacin (LEVAQUIN) IVPB 750 mg  Status:  Discontinued     750 mg 100 mL/hr over 90 Minutes Intravenous Every 24 hours 07/10/16 1233 07/12/16 0901        Subjective:   Leanord AsalJohn Meadow was seen and examined today. Continues to have homicidal  ideations to kill his brother. Wheezing is improving, no fevers and chills. Patient denies dizziness, chest pain, abdominal pain, N/V/D/C, new weakness, numbess, tingling.   Objective:   Vitals:   07/12/16 2126 07/13/16 0245 07/13/16 0516 07/13/16 1037  BP:   96/60   Pulse:   (!) 58 69  Resp:   19 16  Temp:   98.5 F (36.9 C)   TempSrc:      SpO2: 96%  95% 94%  Weight:  73 kg (160 lb 15.9 oz)    Height:        Intake/Output Summary (Last 24 hours) at 07/13/16 1224 Last data filed at 07/13/16 0900  Gross per 24 hour  Intake             1080 ml  Output             1000 ml  Net               80 ml     Wt Readings from Last 3 Encounters:  07/13/16 73 kg (160 lb 15.9 oz)  05/25/16 81.6 kg (180 lb)  04/17/16 69.6 kg (153 lb 6.4 oz)     Exam  General: Alert and oriented   HEENT:    Neck:   Cardiovascular: S1 S2 clear   Respiratory: Mild bilateral expiratory wheezing  Gastrointestinal: Soft, nontender, nondistended, + bowel sounds  Ext: no cyanosis clubbing or edema  Neuro: no new deficits  Skin: No rashes  Psych:  alert and oriented   Data Reviewed:  I have personally reviewed following labs and imaging studies  Micro Results No results found for this or any previous visit (from the past 240 hour(s)).  Radiology Reports Dg Chest Port 1 View  Result Date: 07/09/2016 CLINICAL DATA:  Difficulty breathing and chest pain. EXAM: PORTABLE CHEST 1 VIEW COMPARISON:  Single-view of the chest 06/30/2016 and 06/24/2008. CT chest 04/13/2016. FINDINGS: Bullous emphysematous disease is seen. No edema focal airspace disease. No pneumothorax. No pleural effusion. Heart size is normal. IMPRESSION: No acute disease. Bullous emphysema. Electronically Signed   By: Drusilla Kannerhomas  Dalessio M.D.   On: 07/09/2016 08:51    Lab Data:  CBC:  Recent Labs Lab 07/09/16 0902 07/09/16 1624 07/10/16 0712 07/11/16 0431 07/12/16 0528 07/13/16 0851  WBC 4.7 6.0 10.6* 12.0* 11.0* 7.6    NEUTROABS 2.5  --  9.3*  --   --   --  HGB 12.9* 12.3* 12.0* 12.1* 11.1* 11.6*  HCT 39.0 37.5* 37.0* 37.7* 35.8* 36.6*  MCV 84.1 85.0 86.0 86.5 87.1 87.1  PLT 256 280 288 313 306 257   Basic Metabolic Panel:  Recent Labs Lab 07/09/16 0902 07/09/16 1624 07/10/16 0712 07/11/16 0431 07/12/16 0528 07/13/16 0851  NA 141  --  137 138 137 141  K 3.4*  --  4.9 4.0 3.7 3.3*  CL 110  --  107 105 105 112*  CO2 21*  --  18* 21* 20* 22  GLUCOSE 94  --  130* 128* 201* 81  BUN 10  --  9 12 13 11   CREATININE 1.01 1.23 0.88 1.00 1.01 1.13  CALCIUM 9.0  --  9.6 9.6 9.1 8.8*   GFR: Estimated Creatinine Clearance: 74.5 mL/min (by C-G formula based on SCr of 1.13 mg/dL). Liver Function Tests: No results for input(s): AST, ALT, ALKPHOS, BILITOT, PROT, ALBUMIN in the last 168 hours. No results for input(s): LIPASE, AMYLASE in the last 168 hours. No results for input(s): AMMONIA in the last 168 hours. Coagulation Profile: No results for input(s): INR, PROTIME in the last 168 hours. Cardiac Enzymes:  Recent Labs Lab 07/10/16 1252 07/10/16 1815 07/11/16 0005  TROPONINI <0.03 <0.03 <0.03   BNP (last 3 results) No results for input(s): PROBNP in the last 8760 hours. HbA1C: No results for input(s): HGBA1C in the last 72 hours. CBG: No results for input(s): GLUCAP in the last 168 hours. Lipid Profile: No results for input(s): CHOL, HDL, LDLCALC, TRIG, CHOLHDL, LDLDIRECT in the last 72 hours. Thyroid Function Tests: No results for input(s): TSH, T4TOTAL, FREET4, T3FREE, THYROIDAB in the last 72 hours. Anemia Panel: No results for input(s): VITAMINB12, FOLATE, FERRITIN, TIBC, IRON, RETICCTPCT in the last 72 hours. Urine analysis: No results found for: COLORURINE, APPEARANCEUR, LABSPEC, PHURINE, GLUCOSEU, HGBUR, BILIRUBINUR, KETONESUR, PROTEINUR, UROBILINOGEN, NITRITE, Hurshel Party M.D. [facility] 07/13/2016, 12:24 PM  Pager: [phone] Between 7am to 7pm - call  Pager - [phone]  After 7pm go to [email or web address] - password [email or web address]  Call night coverage person covering after 7pm

## 2016-07-14 MED ORDER — BENZTROPINE MESYLATE 1 MG PO TABS: 1.0000 mg | ORAL_TABLET | Freq: Two times a day (BID) | ORAL | 0 refills | Status: DC

## 2016-07-14 MED ORDER — PREDNISONE 10 MG PO TABS: ORAL_TABLET | ORAL | 0 refills | Status: DC

## 2016-07-14 MED ORDER — QUETIAPINE FUMARATE 100 MG PO TABS: 100.0000 mg | ORAL_TABLET | Freq: Every day | ORAL | 0 refills | Status: DC

## 2016-07-14 MED ORDER — ACETAMINOPHEN 325 MG PO TABS: 650.0000 mg | ORAL_TABLET | Freq: Four times a day (QID) | ORAL | Status: DC | PRN

## 2016-07-14 MED ORDER — THIAMINE HCL 100 MG PO TABS: 100.0000 mg | ORAL_TABLET | Freq: Every day | ORAL | 0 refills | Status: DC

## 2016-07-14 MED ORDER — POTASSIUM CHLORIDE CRYS ER 20 MEQ PO TBCR
40.0000 meq | EXTENDED_RELEASE_TABLET | ORAL | Status: AC
  Administered 2016-07-14 (×2): 40 meq via ORAL
  Filled 2016-07-14: qty 2

## 2016-07-14 NOTE — Discharge Summary (Addendum)
[facility] Discharge Summary   Patient: Francisco Beard WUJ:[id number]   PCP: No PCP Per Patient DOB: [birth year]   Date of admission: 07/09/2016   Date of discharge:  07/14/2016    Discharge Diagnoses:  Principal Problem:   Acute exacerbation of COPD with asthma (HCC) Active Problems:   Suicidal ideations   Bipolar disorder (HCC)   HTN (hypertension)   Chest pain   GERD (gastroesophageal reflux disease)   Polysubstance abuse  Admitted From: home [facility]  Recommendations for Outpatient Follow-up:  1. Follow-up with PCP in one week   Follow-up Information    PCP. Schedule an appointment as soon as possible for a visit in 1 week(s).          Diet recommendation: regular diet  Activity: The patient is advised to gradually reintroduce usual activities.  Discharge Condition: good  Code Status: full code  History of present illness: As per the H and P dictated on admission, "Francisco Beard is a 56 y.o. male with medical history significant of GI Bleed, Mallory Weiss tears, GERD, CHF, HTN and COPD with asthma, bipolar disorder with hallucinations who presented to the ED with c/o progressive SOB. Onset of symptoms last night and patient was unable to sleep d/t progressive dyspnea and cough. Patient usually uses inhalers, but recently has been out of supply.  He denied fever, chills, myalgia, rhinorrhea or watery eyes.   He has a history of cocaine use and admits to relapsing and using it over the weekend. He is also very stressed and angry repeating that he just realized that the brother kills his mother and he is going to kill his brother.  Patient reported having flu like symptoms and subjective fever with chills since the weekend. He also c/o chest pain describing it as "a full truck of load sitting on my chest" and idigestion"  Hospital Course:   Summary of his active problems in the hospital is as following. Principal Problem:   Acute exacerbation of COPD  -  Improving,  - Completed Levaquin and continue oral prednisone   - Influenza PCR negative - Continue Current management  Atypical Chest pain  - associated with cocaine use - Serial troponins negative so far   Bipolar disorder, schizoaffective disorder with active homicidal ideation  - Continue home meds, provide sitter for safety - Appreciate psychiatry recommendations - Patient continues to have homicidal ideations - Transferred to behavioral health for inpatient psychiatric treatment  Polysubstance abuse -  - UDS is positive for Cocaine - currently stable    GERD with recent history of GI bleed in January treated with Hemoclip - Patient still c/o heartburn and abdominal discomfort after meal - cont PPI  bid, continue sucralfate  Hyperlipidemia  - Continue statin  Addendum: [facility] at facility. Francisco Beard  All other chronic medical condition were stable during the hospitalization.  Patient was ambulatory without any assistance. On the day of the discharge the patient's vitals were stable, and no other acute medical condition were reported by patient. the patient was felt [facility].  Procedures and Results:  none   Consultations:  Psychiatric  DISCHARGE MEDICATION: Current Discharge Medication List    START taking these medications   Details  benztropine (COGENTIN) 1 MG tablet Take 1 tablet (1 mg total) by mouth 2 (two) times daily. Qty: 60 tablet, Refills: 0    QUEtiapine (SEROQUEL) 100  MG tablet Take 1 tablet (100 mg total) by mouth at bedtime. Qty: 30 tablet, Refills: 0    thiamine 100 MG tablet Take 1 tablet (100 mg total) by mouth daily. Qty: 30 tablet, Refills: 0      CONTINUE these medications which have CHANGED   Details  predniSONE (DELTASONE) 10 MG tablet Take 40mg  daily for 3days,Take 30mg  daily for 3days,Take 20mg  daily for 3days,Take 10mg   daily for 3days, then stop. Qty: 30 tablet, Refills: 0      CONTINUE these medications which have NOT CHANGED   Details  albuterol (PROVENTIL HFA;VENTOLIN HFA) 108 (90 Base) MCG/ACT inhaler Inhale 1-2 puffs into the lungs every 4 (four) hours as needed for wheezing or shortness of breath. Qty: 18 g, Refills: 0    albuterol (PROVENTIL) (2.5 MG/3ML) 0.083% nebulizer solution Take 3 mLs (2.5 mg total) by nebulization every 4 (four) hours as needed for wheezing or shortness of breath. Qty: 10 vial, Refills: 0    amLODipine (NORVASC) 5 MG tablet Take 1 tablet (5 mg total) by mouth daily. RESUME IN 3 DAYS. Qty: 30 tablet, Refills: 0    Ascorbic Acid (VITAMIN C PO) Take 1 tablet by mouth daily.    atorvastatin (LIPITOR) 20 MG tablet Take 20 mg by mouth daily.    Cholecalciferol (VITAMIN D3) 1000 units CAPS Take 1 capsule by mouth daily.    divalproex (DEPAKOTE) 500 MG DR tablet Take 500 mg by mouth 2 (two) times daily.    DULoxetine (CYMBALTA) 30 MG capsule Take 30 mg by mouth daily.    famotidine (PEPCID AC) 10 MG chewable tablet Chew 10 mg by mouth 2 (two) times daily as needed for heartburn.    ferrous gluconate (FERGON) 324 MG tablet Take 324 mg by mouth daily.    fluPHENAZine (PROLIXIN) 10 MG tablet Take 10-20 mg by mouth See admin instructions. Pt takes 10mg  in am, 20mg  in pm    folic acid (FOLVITE) 1 MG tablet Take 1 mg by mouth daily.    HYDROcodone-acetaminophen (NORCO/VICODIN) 5-325 MG tablet Take 1 tablet by mouth every 6 (six) hours as needed for moderate pain. Qty: 20 tablet, Refills: 0    loratadine (CLARITIN) 10 MG tablet Take 10 mg by mouth daily as needed for allergies.    mometasone-formoterol (DULERA) 200-5 MCG/ACT AERO Inhale 1 puff into the lungs 2 (two) times daily. Qty: 1 Inhaler, Refills: 0    ondansetron (ZOFRAN) 4 MG tablet Take 4 mg by mouth daily as needed for nausea.    pantoprazole (PROTONIX) 40 MG tablet Take 40 mg by mouth 2 (two) times daily.      sucralfate (CARAFATE) 1 g tablet Take 1 g by mouth 4 (four) times daily -  with meals and at bedtime.    theophylline (THEO-24) 300 MG 24 hr capsule Take 300 mg by mouth daily.    tiotropium (SPIRIVA HANDIHALER) 18 MCG inhalation capsule Place 1 capsule (18 mcg total) into inhaler and inhale daily. Qty: 30 capsule, Refills: 0    Vitamin D, Ergocalciferol, (DRISDOL) 50000 units CAPS capsule Take 50,000 Units by mouth once a week.    senna-docusate (SENOKOT-S) 8.6-50 MG tablet Take 2 tablets by mouth at bedtime.      STOP taking these medications     omeprazole (PRILOSEC) 20 MG capsule        Allergies  Allergen Reactions  . Strawberry Extract Anaphylaxis  . Tomato Anaphylaxis  . Aleve [Naproxen] Other (See Comments)    Heart problems so  MD told him to not take  . Aspirin Swelling  . Ibuprofen Swelling  . Penicillins Swelling    Has patient had a PCN reaction causing immediate rash, facial/tongue/throat swelling, SOB or lightheadedness with hypotension: Yes Has patient had a PCN reaction causing severe rash involving mucus membranes or skin necrosis: Yes Has patient had a PCN reaction that required hospitalization: Yes Has patient had a PCN reaction occurring within the last 10 years: No If all of the above answers are "NO", then may proceed with Cephalosporin use.   . Seroquel [Quetiapine] Other (See Comments)    In higher doses, this causes excessive sedation  . Onion Nausea And Vomiting and Rash   Discharge Instructions    Diet - low sodium heart healthy    Complete by:  As directed    Increase activity slowly    Complete by:  As directed      Discharge Exam: Filed Weights   07/12/16 0655 07/13/16 0245 07/14/16 1191  Weight: 73 kg (161 lb) 73 kg (160 lb 15.9 oz) 77 kg (169 lb 12.8 oz)   Vitals:   07/14/16 0000 07/14/16 0612  BP: 124/71 126/71  Pulse: 85 73  Resp: 18 17  Temp: 98 F (36.7 C) 97.8 F (36.6 C)   General: Appear in no distress, no Rash; Oral  Mucosa moist. Cardiovascular: S1 and S2 Present, no Murmur, no JVD Respiratory: Bilateral Air entry present and Clear to Auscultation, no Crackles, no wheezes Abdomen: Bowel Sound present, Soft and no tenderness Extremities: no Pedal edema, no calf tenderness Neurology: Grossly no focal neuro deficit.  The results of significant diagnostics from this hospitalization (including imaging, microbiology, ancillary and laboratory) are listed below for reference.    Significant Diagnostic Studies: Dg Chest Port 1 View  Result Date: 07/09/2016 CLINICAL DATA:  Difficulty breathing and chest pain. EXAM: PORTABLE CHEST 1 VIEW COMPARISON:  Single-view of the chest 06/30/2016 and 06/24/2008. CT chest 04/13/2016. FINDINGS: Bullous emphysematous disease is seen. No edema focal airspace disease. No pneumothorax. No pleural effusion. Heart size is normal. IMPRESSION: No acute disease. Bullous emphysema. Electronically Signed   By: Drusilla Kanner M.D.   On: 07/09/2016 08:51    Microbiology: No results found for this or any previous visit (from the past 240 hour(s)).   Labs: CBC:  Recent Labs Lab 07/09/16 0902 07/09/16 1624 07/10/16 4782 07/11/16 0431 07/12/16 0528 07/13/16 0851  WBC 4.7 6.0 10.6* 12.0* 11.0* 7.6  NEUTROABS 2.5  --  9.3*  --   --   --   HGB 12.9* 12.3* 12.0* 12.1* 11.1* 11.6*  HCT 39.0 37.5* 37.0* 37.7* 35.8* 36.6*  MCV 84.1 85.0 86.0 86.5 87.1 87.1  PLT 256 280 288 313 306 257   Basic Metabolic Panel:  Recent Labs Lab 07/09/16 0902 07/09/16 1624 07/10/16 0712 07/11/16 0431 07/12/16 0528 07/13/16 0851  NA 141  --  137 138 137 141  K 3.4*  --  4.9 4.0 3.7 3.3*  CL 110  --  107 105 105 112*  CO2 21*  --  18* 21* 20* 22  GLUCOSE 94  --  130* 128* 201* 81  BUN 10  --  9 12 13 11   CREATININE 1.01 1.23 0.88 1.00 1.01 1.13  CALCIUM 9.0  --  9.6 9.6 9.1 8.8*   Liver Function Tests: No results for input(s): AST, ALT, ALKPHOS, BILITOT, PROT, ALBUMIN in the last 168  hours. No results for input(s): LIPASE, AMYLASE in the last 168 hours. No  results for input(s): AMMONIA in the last 168 hours. Cardiac Enzymes:  Recent Labs Lab 07/10/16 1252 07/10/16 1815 07/11/16 0005  TROPONINI <0.03 <0.03 <0.03   BNP (last 3 results)  Recent Labs  04/01/16 1412  BNP 43.2   CBG: No results for input(s): GLUCAP in the last 168 hours. Time spent: 35 minutes  Signed:  Lynden Oxford  [facility]  07/14/2016  , 3:29 PM

## 2016-07-15 MED ORDER — FAMOTIDINE 20 MG PO TABS
20.0000 mg | ORAL_TABLET | Freq: Every day | ORAL | Status: DC
  Administered 2016-07-15 – 2016-07-24 (×10): 20 mg via ORAL
  Filled 2016-07-15 (×10): qty 1

## 2016-07-15 MED ORDER — PREDNISONE 20 MG PO TABS
40.0000 mg | ORAL_TABLET | Freq: Every day | ORAL | Status: DC
  Administered 2016-07-16 – 2016-07-23 (×8): 40 mg via ORAL
  Filled 2016-07-15 (×9): qty 2

## 2016-07-15 NOTE — Care Management Note (Addendum)
Case Management Note  Patient Details  Name: Francisco Beard MRN: [id number] Date of Birth: [birth year]  Subjective/Objective:          Pt presented with acute exacerbation of COPD with asthma , bipolar disorder with hallucinations, schizoaffective disorder with active homicidal ideation .   Action/Plan: Pt medically cleared, awaiting [facility] bed. CSW managing disposition to inpatient psych  facility.  Expected Discharge Date:  07/14/16               Expected Discharge Plan:  Psychiatric Hospital  In-House Referral:  Clinical Social Work  Discharge planning Services  CM Consult  PStatus of Service:  completed  If discussed at [employer] Meetings, dates discussed:    Additional Comments:  Francisco Beard, Francisco [place], RN 07/15/2016, 10:57 AM

## 2016-07-15 NOTE — Consult Note (Signed)
[facility] - Inpatient Face-to-Face Psychiatry Consult   Reason for Consult:  Hallucinations, substance abuse, suicidal/homicidal ideation Referring Physician:  Dr. Isidoro Donning Patient Identification: Francisco Beard MRN:  [id number] Principal Diagnosis: Acute exacerbation of COPD with asthma [facility]) Diagnosis:   Patient Active Problem List   Diagnosis Date Noted  . Acute exacerbation of COPD with asthma (HCC) [J44.1, J45.901] 07/09/2016  . GERD (gastroesophageal reflux disease) [K21.9] 07/09/2016  . Polysubstance abuse [F19.10] 07/09/2016  . Homicidal ideation [R45.850]   . Bipolar disorder (HCC) [F31.9] 04/17/2016  . HTN (hypertension) [I10] 04/17/2016  . Chest pain [R07.9]   . COPD exacerbation (HCC) [J44.1] 04/13/2016  . COPD (chronic obstructive pulmonary disease) (HCC) [J44.9] 04/13/2016  . Avascular necrosis of bone of right hip (HCC) [M87.051] 04/08/2016  . COPD with acute exacerbation (HCC) [J44.1] 04/01/2016  . Suicidal ideations [R45.851] 08/17/2013    Total Time spent with patient: 1 hour  Subjective:   Francisco Beard is a 56 y.o. male patient admitted with progressive shortness of breath, intoxication with illicit substances.  HPI:  Francisco Beard a 56 y.o.male, seen, chart reviewed for this face-to-face psychiatric consultation and evaluation of increased symptoms of bipolar depression, psychosis, relapsed on drugs of abuse which is cocaine and also reported suicidal/homicidal ideation. Patient stated that he has been living by himself in an apartment and has a friends who is, around with alcohol and cocaine. Patient reported he spent about $400 worth of cocaine and hallucinating and could not sleep before coming to the hospital. Patient also reported he was moved out of his mother's home to an apartment by his brother moved into his mother's home. Reportedly patient stated his mother were shocked by his brother because of conflict regarding money. Patient reportedly was upset, angry and won't kill his  brother but ended up relapsing his drug of abuse which is alcohol and cocaine which makes him more psychotic with hallucinations. Review of his medical records from [facility] indicated patient has been diagnosed with a schizoaffective disorder/bipolar disorder and also more frequently visit the hospital with malingering hallucinations, suicidal/homicidal ideations. Patient usually well within few days and then go home. Patient is requesting to be sent him to [facility] for long-term psychiatric hospitalization. Urine drug screen indicated positive for cocaine and blood alcohol is not significant. Patient does not know what medication he takes but reportedly ACT team is following up with him and acting staff review his medications. This and did not contact his acting members since came to the hospital. She reportedly deceived acting services from [facility]" and reportedly treated by Dr. Olivia Mackie.  Past Psychiatric History: Schizoaffective disorder, cocaine and alcohol abuse versus dependence and questionable noncompliant with medications and malingering. Patient has multiple acute psychiatric hospitalization at [facility] in his last hospitalization was 05/28/2016.  07/15/2016 Interval history: Patient seen for psychiatric consultation follow-up. Patient complain feeling depressed, hearing voices and seeing visual hallucinations as a somebody's walking around him and feeling paranoid. Patient continued to endorse suicidal and homicidal ideation with plan of shooting his brother who killed his mother. Patient is also requesting long-term psychiatric hospitalization for both mental health and also for polysubstance abuse. Patient is compliant with his medication and have a one-to-one sitter for safety as is reporting suicidal and homicidal ideation. Patient stated that he does not want to be transferred to the [facility] because he  does not believe doctors working for his benefit.  Patient is also known for chronic  schizoaffective disorder and also polysubstance abuse and malingering behaviors. Patient was restarted on his home medication and has no reported adverse effect of the medication.  Risk to Self: Is patient at risk for suicide?: Yes Risk to Others:   Prior Inpatient Therapy:   Prior Outpatient Therapy:    Past Medical History:  Past Medical History:  Diagnosis Date  . Anxiety   . Arthritis    "hips; bottom part of my back" (07/09/2016)  . Asthmatic bronchitis   . Bipolar disorder (HCC)   . CHF (congestive heart failure) (HCC)   . Chronic bronchitis (HCC)   . Chronic hip pain   . COPD (chronic obstructive pulmonary disease) (HCC)   . Depression   . GERD (gastroesophageal reflux disease)   . High cholesterol   . History of hiatal hernia   . Hypertension   . Irregular heart beats   . Pneumonia    "3 times; it's been awhile" (07/09/2016)  . Schizophrenia (HCC)   . Type II diabetes mellitus (HCC)     Past Surgical History:  Procedure Laterality Date  . CLOSED REDUCTION HAND FRACTURE Right    "got 6 screws and a plate in there"  . ELBOW FRACTURE SURGERY Left    "put it through the wall"  . FRACTURE SURGERY     Family History: History reviewed. No pertinent family history. Family Psychiatric  History: Noncontributory Social History:  History  Alcohol Use  . 12.0 oz/week  . 20 Standard drinks or equivalent per week    Comment: 07/09/2016 "I had quit for 1 yr, relapsed 3 days ago"     History  Drug Use  . Types: "Crack" cocaine    Comment: 07/09/2016 "I had quit for 1 yr, relapsed 3 days ago"    Social History   Social History  . Marital status: Divorced    Spouse name: N/A  . Number of children: N/A  . Years of education: N/A   Social History Main Topics  . Smoking status: Former Smoker    Packs/day: 3.00    Years: 41.00    Types: Cigarettes    Quit date: 04/08/2016  . Smokeless  tobacco: Never Used  . Alcohol use 12.0 oz/week    20 Standard drinks or equivalent per week     Comment: 07/09/2016 "I had quit for 1 yr, relapsed 3 days ago"  . Drug use: Yes    Types: "Crack" cocaine     Comment: 07/09/2016 "I had quit for 1 yr, relapsed 3 days ago"  . Sexual activity: Not Currently   Other Topics Concern  . None   Social History Narrative  . None   Additional Social History:    Allergies:   Allergies  Allergen Reactions  . Strawberry Extract Anaphylaxis  . Tomato Anaphylaxis  . Aleve [Naproxen] Other (See Comments)    Heart problems so MD told him to not take  . Aspirin Swelling  . Ibuprofen Swelling  . Penicillins Swelling    Has patient had a PCN reaction causing immediate rash, facial/tongue/throat swelling, SOB or lightheadedness with hypotension: Yes Has patient had a PCN reaction causing severe rash involving mucus membranes or skin necrosis: Yes Has patient had a PCN reaction that required hospitalization: Yes Has patient had a PCN reaction occurring within the last 10 years: No If all of the above answers are "NO", then may proceed with Cephalosporin use.   . Seroquel [Quetiapine] Other (See Comments)    In higher doses, this causes  excessive sedation  . Onion Nausea And Vomiting and Rash    Labs:  No results found for this or any previous visit (from the past 48 hour(s)).  Current Facility-Administered Medications  Medication Dose Route Frequency Provider Last Rate Last Dose  . acetaminophen (TYLENOL) tablet 650 mg  650 mg Oral Q6H PRN Rolly SalterPranav M Patel, MD      . amLODipine (NORVASC) tablet 5 mg  5 mg Oral Daily Isaiah BlakesMarina S Kyazimova, PA-C   5 mg at 07/15/16 0949  . atorvastatin (LIPITOR) tablet 20 mg  20 mg Oral q1800 Isaiah BlakesMarina S Kyazimova, PA-C   20 mg at 07/14/16 1732  . benztropine (COGENTIN) tablet 1 mg  1 mg Oral BID Leata MouseJanardhana Mikaila Grunert, MD   1 mg at 07/15/16 0947  . divalproex (DEPAKOTE) DR tablet 500 mg  500 mg Oral BID Isaiah BlakesMarina S Kyazimova,  PA-C   500 mg at 07/15/16 0948  . enoxaparin (LOVENOX) injection 40 mg  40 mg Subcutaneous Q24H Marina S HerkimerKyazimova, PA-C      . famotidine (PEPCID) tablet 20 mg  20 mg Oral Daily Rolly SalterPranav M Patel, MD      . ferrous gluconate [facility](FERGON) tablet 324 mg  324 mg Oral Daily Isaiah BlakesMarina S Kyazimova, PA-C   324 mg at 07/15/16 0950  . fluPHENAZine (PROLIXIN) tablet 10 mg  10 mg Oral Daily Gerhard MunchKimberly B Hammons, RPH   10 mg at 07/15/16 0949   And  . fluPHENAZine (PROLIXIN) tablet 20 mg  20 mg Oral QHS Kimberly B Hammons, RPH   20 mg at 07/14/16 2238  . folic acid (FOLVITE) tablet 1 mg  1 mg Oral Daily Ancil LinseyMarina S Difficult RunKyazimova, PA-C   1 mg at 07/15/16 0948  . HYDROcodone-acetaminophen (NORCO/VICODIN) 5-325 MG per tablet 1 tablet  1 tablet Oral Q6H PRN Isaiah BlakesMarina S Kyazimova, PA-C   1 tablet at 07/14/16 1034  . ipratropium-albuterol (DUONEB) 0.5-2.5 (3) MG/3ML nebulizer solution 3 mL  3 mL Nebulization BID Ripudeep Jenna LuoK Rai, MD   3 mL at 07/15/16 0858  . levofloxacin (LEVAQUIN) tablet 750 mg  750 mg Oral Daily Ripudeep Jenna LuoK Rai, MD   750 mg at 07/15/16 0948  . loratadine (CLARITIN) tablet 10 mg  10 mg Oral Daily PRN Isaiah BlakesMarina S Kyazimova, PA-C      . multivitamin with minerals tablet 1 tablet  1 tablet Oral Daily Isaiah BlakesMarina S Kyazimova, PA-C   1 tablet at 07/15/16 1000  . ondansetron (ZOFRAN) tablet 4 mg  4 mg Oral Q6H PRN Isaiah BlakesMarina S Kyazimova, PA-C       Or  . ondansetron [facility](ZOFRAN) injection 4 mg  4 mg Intravenous Q6H PRN Isaiah BlakesMarina S Kyazimova, PA-C      . pantoprazole (PROTONIX) EC tablet 40 mg  40 mg Oral BID Isaiah BlakesMarina S Kyazimova, PA-C   40 mg at 07/15/16 0948  . polyethylene glycol (MIRALAX / GLYCOLAX) packet 17 g  17 g Oral Daily PRN Isaiah BlakesMarina S Kyazimova, PA-C   17 g at 07/14/16 1026  . predniSONE (DELTASONE) tablet 60 mg  60 mg Oral Q breakfast Ripudeep Jenna LuoK Rai, MD   60 mg at 07/15/16 0955  . QUEtiapine (SEROQUEL) tablet 100 mg  100 mg Oral QHS Leata MouseJanardhana Darcell Yacoub, MD   100 mg at 07/14/16 2238  . sucralfate (CARAFATE) tablet 1 g  1 g Oral TID  WC & HS Isaiah BlakesMarina S Kyazimova, PA-C   1 g at 07/15/16 0950  . thiamine (VITAMIN B-1) tablet 100 mg  100 mg Oral Daily Ancil LinseyMarina S  Kyazimova, PA-C   100 mg at 07/15/16 0948  . Vitamin D (Ergocalciferol) (DRISDOL) capsule 50,000 Units  50,000 Units Oral Weekly Isaiah Blakes, PA-C   50,000 Units at 07/11/16 1031  . zolpidem (AMBIEN) tablet 5 mg  5 mg Oral QHS PRN,MR X 1 Marina S [place], PA-C   5 mg at 07/09/16 2208    Musculoskeletal: Strength & Muscle Tone: within normal limits Gait & Station: normal Patient leans: N/A  Psychiatric Specialty Exam: Physical Exam as per history and physical   ROS patient denied nausea, vomiting, abdominal pain and shortness of breath. No Fever-chills, No Headache, No changes with Vision or hearing, reports vertigo No problems swallowing food or Liquids, No Chest pain, Cough or Shortness of Breath, No Abdominal pain, No Nausea or Vommitting, Bowel movements are regular, No Blood in stool or Urine, No dysuria, No new skin rashes or bruises, No new joints pains-aches,  No new weakness, tingling, numbness in any extremity, No recent weight gain or loss, No polyuria, polydypsia or polyphagia,  A full 10 point Review of Systems was done, except as stated above, all other Review of Systems were negative.  Blood pressure 116/65, pulse 71, temperature 98.1 F (36.7 C), temperature source Oral, resp. rate 17, height 5\' 11"  (1.803 m), weight 74.3 kg (163 lb 14.4 oz), SpO2 96 %.Body mass index is 22.86 kg/m.  General Appearance: Guarded  Eye Contact:  Good  Speech:  Clear and Coherent  Volume:  Normal  Mood:  Angry and Depressed  Affect:  Appropriate  Thought Process:  Coherent and Goal Directed  Orientation:  Full (Time, Place, and Person)  Thought Content:  Hallucinations: Auditory and Rumination  Suicidal Thoughts:  Yes.  with intent/plan  Homicidal Thoughts:  Yes.  with intent/plan  Memory:  Immediate;   Good Recent;   Fair Remote;   Fair   Judgement:  Impaired  Insight:  Fair  Psychomotor Activity:  Decreased  Concentration:  Concentration: Fair and Attention Span: Fair  Recall:  [employer] of Knowledge:  Good  Language:  Good  Akathisia:  Negative  Handed:  Right  AIMS (if indicated):     Assets:  Communication Skills Desire for Improvement Housing Leisure Time Resilience Social Support  ADL's:  Intact  Cognition:  WNL  Sleep:        Treatment Plan Summary: 56 years old male with chronic mental illness especially with schizoaffective disorder/bipolar disorder, substance abuse especially cocaine and alcohol presented with cocaine intoxication, shortness of breath, decreased need for sleep increased agitation and anger and also reports suicidal/homicidal ideation with the plan of using a gun. Patient reported he has an access to the gun which was put away. Patient Brother was on run according to him.  Patient has been medically cleared and has no symptoms of alcohol withdrawal or cocaine crash Appreciate LCSW and case manager are working on finding appropriate placement for this patient  Depakote DR 500 mg twice daily for mood swings Prolixin 10 mg daily morning and 20 mg at bedtime for hallucinations and psychosis Seroquel 100 mg daily and benztropine 1 mg twice daily for EPS Social service to contact and [facility] ACT team fo collateral information and assistance with the disposition plans  Appreciate psychiatric consultation and follow up as clinically required Please contact [phone] or [phone] if needs further assistance  Disposition: Recommend psychiatric Inpatient admission when medically cleared. Supportive therapy provided about ongoing stressors.  Leata Mouse, MD 07/15/2016 11:20 AM

## 2016-07-15 NOTE — Progress Notes (Signed)
[facility]  CSW continuing to follow and expanding bed search  Burna SisJenna H. Mayra Jolliffe, LCSW Clinical Social Worker [phone]

## 2016-07-15 NOTE — Progress Notes (Signed)
[facility] PROGRESS NOTE  Patient: Francisco Beard ZOX:[id number]   PCP: No PCP Per Patient DOB: 1961-03-07   DOA: 07/09/2016   DOS: 07/15/2016    Subjective: Complains about some heartburn  Objective:  Vitals:   07/15/16 0611 07/15/16 0949  BP: 129/75 116/65  Pulse: 71   Resp: 17   Temp: 98.1 F (36.7 C)     Clear to auscultation. Assessment and S2 present. Bowel sound present nontender.  Assessment and plan: GERD. Schedule Pepcid ordered.  COPD exacerbation. Changing prednisone 60 mg to 40 mg daily.  Homicidal ideation. Patient still remains stable to be discharged to behavioral health whenever there is a bed available.  Author: Lynden OxfordPranav Qamar Aughenbaugh, MD [facility] Pager: [phone] 07/15/2016 1:03 PM   If 7PM-7AM, please contact night-coverage at [email or web address], password [email or web address]

## 2016-07-16 ENCOUNTER — Inpatient Hospital Stay (HOSPITAL_COMMUNITY): Payer: Medicaid Other

## 2016-07-16 DIAGNOSIS — R072 Precordial pain: Secondary | ICD-10-CM

## 2016-07-16 LAB — ECHOCARDIOGRAM COMPLETE
HEIGHTINCHES: 71 in
WEIGHTICAEL: 2659.2 [oz_av]

## 2016-07-16 LAB — CREATININE, SERUM
Creatinine, Ser: 1.13 mg/dL (ref 0.61–1.24)
GFR calc Af Amer: >60 mL/min (ref >60)
GFR calc non Af Amer: >60 mL/min (ref >60)

## 2016-07-16 LAB — TROPONIN I: Troponin I: <0.03 ng/mL (ref <0.03)

## 2016-07-16 MED ORDER — MENTHOL 3 MG MT LOZG: 1.0000 | LOZENGE | OROMUCOSAL | Status: DC | PRN

## 2016-07-16 MED ORDER — ISOSORBIDE MONONITRATE ER 30 MG PO TB24
30.0000 mg | ORAL_TABLET | Freq: Every day | ORAL | Status: DC
  Administered 2016-07-16 – 2016-07-24 (×9): 30 mg via ORAL
  Filled 2016-07-16 (×9): qty 1

## 2016-07-16 MED ORDER — GI COCKTAIL ~~LOC~~
30.0000 mL | Freq: Once | ORAL | Status: AC
  Administered 2016-07-16: 30 mL via ORAL
  Filled 2016-07-16: qty 30

## 2016-07-16 MED ORDER — ASPIRIN 325 MG PO TABS
ORAL_TABLET | ORAL | Status: AC
  Administered 2016-07-16: 325 mg
  Filled 2016-07-16: qty 1

## 2016-07-16 MED ORDER — PHENOL 1.4 % MT LIQD
1.0000 | OROMUCOSAL | Status: DC | PRN
  Administered 2016-07-16: 1 via OROMUCOSAL
  Filled 2016-07-16: qty 177

## 2016-07-16 MED ORDER — ASPIRIN EC 325 MG PO TBEC
325.0000 mg | DELAYED_RELEASE_TABLET | Freq: Every day | ORAL | Status: DC
  Administered 2016-07-16: 325 mg via ORAL

## 2016-07-16 MED ORDER — NITROGLYCERIN 0.4 MG SL SUBL
0.4000 mg | SUBLINGUAL_TABLET | SUBLINGUAL | Status: DC | PRN
  Administered 2016-07-16: 0.4 mg via SUBLINGUAL
  Filled 2016-07-16: qty 1

## 2016-07-16 MED ORDER — ALUM & MAG HYDROXIDE-SIMETH 200-200-20 MG/5ML PO SUSP: 15.0000 mL | ORAL | Status: DC | PRN

## 2016-07-16 MED ORDER — ENSURE ENLIVE PO LIQD
237.0000 mL | Freq: Two times a day (BID) | ORAL | Status: DC
  Administered 2016-07-17 – 2016-07-24 (×12): 237 mL via ORAL

## 2016-07-16 NOTE — Progress Notes (Signed)
Lab needed the troponin re-ordered.The second draw at 6:30pm was missed and sample was too old. NP paged to reorder to complete the 3 runs.  Will continue to monitor.

## 2016-07-16 NOTE — Progress Notes (Signed)
  Echocardiogram 2D Echocardiogram has been performed.  Latoshia Monrroy L Androw 07/16/2016, 2:42 PM

## 2016-07-16 NOTE — Progress Notes (Addendum)
[facility] PROGRESS NOTE  Patient: Francisco Beard VHQ:[id number]   PCP: No PCP Per Patient DOB: 08/30/1960   DOA: 07/09/2016   DOS: 07/16/2016    Subjective: Complains about Chest tightness with shortness of breath. Also has some sore throat.  Objective:  Vitals:   07/16/16 0523 07/16/16 1345  BP: 130/76 117/76  Pulse: 82 80  Resp: 18 (!) 21  Temp: 97.6 F (36.4 C) 97.8 F (36.6 C)    Clear to auscultation. S1 and S2 present. Bowel sound present nontender. No edema. Oral mucosa clear, no erythema.  Assessment and plan: Chest pain. Initial EKG showed T-wave inversions in lead 3 and aVF as well as V5. This was new as compared to admission EKG. Repeat EKG an hour later shows normal sinus rhythm without any T-wave inversions. Check serial troponin and echocardiogram, hold discharge today. Less likely cardiac pain. Pt allergic to aspirin, says he swell up, does not remember when he took it. Continue mylanta, prn nitro. Addendum: Troponin unremarkable, echocardiogram unremarkable as well. Patient should be stable to be discharged from medical point of view tomorrow morning pending negative troponins 2.  Hanni Milford 5:19 PM 07/16/2016   GERD. Schedule Pepcid ordered.  COPD exacerbation. Changing prednisone 60 mg to 40 mg daily.  Homicidal ideation. Hold discharge. If troponin negative and echo unremarkable, pt can be discharged to behavioral health whenever there is a bed available.  Author: Lynden OxfordPranav Eufemio Strahm, MD [facility] Pager: [phone] 07/16/2016 2:34 PM   If 7PM-7AM, please contact night-coverage at [email or web address], password [email or web address]

## 2016-07-16 NOTE — Progress Notes (Signed)
Dr. Allena KatzPatel paged, informed of pt c/o of chest pain and feeling like someone is walking on his chest. EKG ordered, Nitro sublingual, GI cocktail.

## 2016-07-16 NOTE — Progress Notes (Signed)
CSW continues to follow for psych placement- no psych beds available today.  Burna SisJenna H. Ashira Kirsten, LCSW Clinical Social Worker [phone]

## 2016-07-17 LAB — TROPONIN I
Troponin I: <0.03 ng/mL (ref <0.03)
Troponin I: 0.03 ng/mL (ref <0.03)

## 2016-07-17 NOTE — Progress Notes (Signed)
CRITICAL VALUE ALERT  Critical value received: Rennis Goldenoyin Africa Masaki, RN  Date of notification:  07/17/16  Time of notification:  6:50am  Critical value read back:Yes.    Nurse who received alert:  Rennis Goldenoyin Kerigan Narvaez, RN  MD notified (1st page):  Craige CottaKirby, NP  Time of first page:  6:52am  MD notified (2nd page):

## 2016-07-17 NOTE — Progress Notes (Signed)
Patient stable for medical discharge. Awaiting bed placement.  Patient has no new complaints today. No chest pain reported  No increased work of breathing  Vital signs stable  Gen.: Patient in no acute distress Pulmonary: No increased work of breathing, equal chest rise, no wheezes Cardiovascular: No cyanosis  Once bed available and inpatient psychiatric unit will plan on discharging.  Francisco Beard, [employer]

## 2016-07-17 NOTE — Consult Note (Signed)
[facility] Consult   Reason for Consult:  Hallucinations, substance abuse, suicidal/homicidal ideation Referring Physician:  Dr. Tana Coast Patient Identification: Francisco Beard MRN:  [id number] Principal Diagnosis: Acute exacerbation of COPD with asthma [facility]) Diagnosis:   Patient Active Problem List   Diagnosis Date Noted  . Acute exacerbation of COPD with asthma ([place]) [J44.1, H85.277] 07/09/2016  . GERD (gastroesophageal reflux disease) [K21.9] 07/09/2016  . Polysubstance abuse [F19.10] 07/09/2016  . Homicidal ideation [R45.850]   . Bipolar disorder ([place]) [F31.9] 04/17/2016  . HTN (hypertension) [I10] 04/17/2016  . Chest pain [R07.9]   . COPD exacerbation ([place]) [J44.1] 04/13/2016  . COPD (chronic obstructive pulmonary disease) ([place]) [J44.9] 04/13/2016  . Avascular necrosis of bone of right hip ([place]) [M87.051] 04/08/2016  . COPD with acute exacerbation ([place]) [J44.1] 04/01/2016  . Suicidal ideations [R45.851] 08/17/2013    Total Time spent with patient: 1 hour  Subjective:   Francisco Beard is a 56 y.o. male patient admitted with progressive shortness of breath, intoxication with illicit substances.  HPI:  Francisco Beard a 56 y.o.male, seen, chart reviewed for this face-to-face psychiatric consultation and evaluation of increased symptoms of bipolar depression, psychosis, relapsed on drugs of abuse which is cocaine and also reported suicidal/homicidal ideation. Patient stated that he has been living by himself in an apartment and has a friends who is, around with alcohol and cocaine. Patient reported he spent about $400 worth of cocaine and hallucinating and could not sleep before coming to the hospital. Patient also reported he was moved out of his mother's home to an apartment by his brother moved into his mother's home. Reportedly patient stated his mother were shocked by his brother because of conflict regarding money. Patient reportedly was upset, angry and won't kill his  brother but ended up relapsing his drug of abuse which is alcohol and cocaine which makes him more psychotic with hallucinations. Review of his medical records from [facility] indicated patient has been diagnosed with a schizoaffective disorder/bipolar disorder and also more frequently visit the hospital with malingering hallucinations, suicidal/homicidal ideations. Patient usually well within few days and then go home. Patient is requesting to be sent him to [facility] for long-term psychiatric hospitalization. Urine drug screen indicated positive for cocaine and blood alcohol is not significant. Patient does not know what medication he takes but reportedly ACT team is following up with him and acting staff review his medications. This and did not contact his acting members since came to the hospital. She reportedly deceived acting services from [facility]" and reportedly treated by Dr. Gae Bon.  Past Psychiatric History: Schizoaffective disorder, cocaine and alcohol abuse versus dependence and questionable noncompliant with medications and malingering. Patient has multiple acute psychiatric hospitalization at [facility] in his last hospitalization was 05/28/2016.  07/17/2016 Interval history: Patient seen for psychiatric consultation follow-up, safety sitter is at bedside. Reportedly patient has been compliant with his medication management without adverse effects. Patient continued to report being depressed, eating auditory hallucinations and seeing visual hallucinations and also endorses both suicidal and homicidal ideation. Patient stated he wants to be placed for long-term psychiatric placement and cannot contract for safety. Patient is also known for chronic schizoaffective disorder and also polysubstance abuse and malingering behaviors. Patient was restarted on his home medication and has no reported adverse effect of the medication.  Risk to  Self: Is patient at risk for suicide?: Yes Risk to Others:   Prior Inpatient Therapy:   Prior  Outpatient Therapy:    Past Medical History:  Past Medical History:  Diagnosis Date  . Anxiety   . Arthritis    "hips; bottom part of my back" (07/09/2016)  . Asthmatic bronchitis   . Bipolar disorder ([place])   . CHF (congestive heart failure) ([place])   . Chronic bronchitis ([place])   . Chronic hip pain   . COPD (chronic obstructive pulmonary disease) ([place])   . Depression   . GERD (gastroesophageal reflux disease)   . High cholesterol   . History of hiatal hernia   . Hypertension   . Irregular heart beats   . Pneumonia    "3 times; it's been awhile" (07/09/2016)  . Schizophrenia ([place])   . Type II diabetes mellitus ([place])     Past Surgical History:  Procedure Laterality Date  . CLOSED REDUCTION HAND FRACTURE Right    "got 6 screws and a plate in there"  . ELBOW FRACTURE SURGERY Left    "put it through the wall"  . FRACTURE SURGERY     Family History: History reviewed. No pertinent family history. Family Psychiatric  History: Noncontributory Social History:  History  Alcohol Use  . 12.0 oz/week  . 20 Standard drinks or equivalent per week    Comment: 07/09/2016 "I had quit for 1 yr, relapsed 3 days ago"     History  Drug Use  . Types: "Crack" cocaine    Comment: 07/09/2016 "I had quit for 1 yr, relapsed 3 days ago"    Social History   Social History  . Marital status: Divorced    Spouse name: N/A  . Number of children: N/A  . Years of education: N/A   Social History Main Topics  . Smoking status: Former Smoker    Packs/day: 3.00    Years: 41.00    Types: Cigarettes    Quit date: 04/08/2016  . Smokeless tobacco: Never Used  . Alcohol use 12.0 oz/week    20 Standard drinks or equivalent per week     Comment: 07/09/2016 "I had quit for 1 yr, relapsed 3 days ago"  . Drug use: Yes    Types: "Crack" cocaine     Comment: 07/09/2016 "I had quit for 1 yr, relapsed 3 days ago"  . Sexual  activity: Not Currently   Other Topics Concern  . None   Social History Narrative  . None   Additional Social History:    Allergies:   Allergies  Allergen Reactions  . Strawberry Extract Anaphylaxis  . Tomato Anaphylaxis  . Aleve [Naproxen] Other (See Comments)    Heart problems so MD told him to not take  . Aspirin Swelling  . Ibuprofen Swelling  . Penicillins Swelling    Has patient had a PCN reaction causing immediate rash, facial/tongue/throat swelling, SOB or lightheadedness with hypotension: Yes Has patient had a PCN reaction causing severe rash involving mucus membranes or skin necrosis: Yes Has patient had a PCN reaction that required hospitalization: Yes Has patient had a PCN reaction occurring within the last 10 years: No If all of the above answers are "NO", then may proceed with Cephalosporin use.   . Seroquel [Quetiapine] Other (See Comments)    In higher doses, this causes excessive sedation  . Onion Nausea And Vomiting and Rash    Labs:  Results for orders placed or performed during the hospital encounter of 07/09/16 (from the past 48 hour(s))  Creatinine, serum     Status: None   Collection Time: 07/16/16  5:25 AM  Result Value Ref Range   Creatinine, Ser 1.13 0.61 - 1.24 mg/dL   GFR calc non Af Amer >60 >60 mL/min   GFR calc Af Amer >60 >60 mL/min    Comment: (NOTE) The eGFR has been calculated using the CKD EPI equation. This calculation has not been validated in all clinical situations. eGFR's persistently <60 mL/min signify possible Chronic Kidney Disease.   Troponin I (q 6hr x 3)     Status: None   Collection Time: 07/16/16  1:53 PM  Result Value Ref Range   Troponin I <0.03 <0.03 ng/mL  Troponin I     Status: None   Collection Time: 07/16/16 11:07 PM  Result Value Ref Range   Troponin I <0.03 <0.03 ng/mL  Troponin I     Status: Abnormal   Collection Time: 07/17/16  5:37 AM  Result Value Ref Range   Troponin I 0.03 (HH) <0.03 ng/mL     Comment: CRITICAL RESULT CALLED TO, READ BACK BY AND VERIFIED WITH: Mercy Moore [phone] WILDERK     Current Facility-Administered Medications  Medication Dose Route Frequency Provider Last Rate Last Dose  . acetaminophen (TYLENOL) tablet 650 mg  650 mg Oral Q6H PRN Lavina Hamman, MD      . alum & mag hydroxide-simeth (MAALOX/MYLANTA) 200-200-20 MG/5ML suspension 15 mL  15 mL Oral Q4H PRN Lavina Hamman, MD      . amLODipine (NORVASC) tablet 5 mg  5 mg Oral Daily Brenton Grills, PA-C   5 mg at 07/17/16 0900  . atorvastatin (LIPITOR) tablet 20 mg  20 mg Oral q1800 Brenton Grills, PA-C   20 mg at 07/16/16 1744  . benztropine (COGENTIN) tablet 1 mg  1 mg Oral BID Ambrose Finland, MD   1 mg at 07/17/16 0900  . divalproex (DEPAKOTE) DR tablet 500 mg  500 mg Oral BID Brenton Grills, PA-C   500 mg at 07/17/16 0901  . enoxaparin (LOVENOX) injection 40 mg  40 mg Subcutaneous Q24H Marina S [place], PA-C      . famotidine (PEPCID) tablet 20 mg  20 mg Oral Daily Lavina Hamman, MD   20 mg at 07/17/16 0900  . feeding supplement (ENSURE ENLIVE) (ENSURE ENLIVE) liquid 237 mL  237 mL Oral BID BM Lavina Hamman, MD   237 mL at 07/17/16 1015  . ferrous gluconate (FERGON) tablet 324 mg  324 mg Oral Daily Brenton Grills, PA-C   324 mg at 07/17/16 0901  . fluPHENAZine (PROLIXIN) tablet 10 mg  10 mg Oral Daily Theone Murdoch Hammons, RPH   10 mg at 07/17/16 0901   And  . fluPHENAZine (PROLIXIN) tablet 20 mg  20 mg Oral QHS Kimberly B Hammons, RPH   20 mg at 07/16/16 2322  . folic acid (FOLVITE) tablet 1 mg  1 mg Oral Daily Sharen Hint [place], PA-C   1 mg at 07/17/16 0901  . HYDROcodone-acetaminophen (NORCO/VICODIN) 5-325 MG per tablet 1 tablet  1 tablet Oral Q6H PRN Brenton Grills, PA-C   1 tablet at 07/17/16 1012  . ipratropium-albuterol (DUONEB) 0.5-2.5 (3) MG/3ML nebulizer solution 3 mL  3 mL Nebulization BID Ripudeep Krystal Eaton, MD   3 mL at 07/17/16 0927  . isosorbide mononitrate  (IMDUR) 24 hr tablet 30 mg  30 mg Oral Daily Lavina Hamman, MD   30 mg at 07/17/16 0901  . loratadine (CLARITIN) tablet 10 mg  10 mg Oral Daily PRN Lenda Kelp  S Kyazim[place], PA-C      . menthol-cetylpyridinium (CEPACOL) lozenge 3 mg  1 lozenge Oral PRN Lavina Hamman, MD      . multivitamin with minerals tablet 1 tablet  1 tablet Oral Daily Brenton Grills, PA-C   1 tablet at 07/17/16 0901  . nitroGLYCERIN (NITROSTAT) SL tablet 0.4 mg  0.4 mg Sublingual Q5 min PRN Lavina Hamman, MD   0.4 mg at 07/16/16 1131  . ondansetron (ZOFRAN) tablet 4 mg  4 mg Oral Q6H PRN Brenton Grills, PA-C       Or  . ondansetron [facility]) injection 4 mg  4 mg Intravenous Q6H PRN Brenton Grills, PA-C      . pantoprazole (PROTONIX) EC tablet 40 mg  40 mg Oral BID Brenton Grills, PA-C   40 mg at 07/17/16 0901  . phenol (CHLORASEPTIC) mouth spray 1 spray  1 spray Mouth/Throat PRN Lavina Hamman, MD   1 spray at 07/16/16 0150  . polyethylene glycol (MIRALAX / GLYCOLAX) packet 17 g  17 g Oral Daily PRN Brenton Grills, PA-C   17 g at 07/14/16 1026  . predniSONE (DELTASONE) tablet 40 mg  40 mg Oral Q breakfast Lavina Hamman, MD   40 mg at 07/17/16 0901  . QUEtiapine (SEROQUEL) tablet 100 mg  100 mg Oral QHS Ambrose Finland, MD   100 mg at 07/16/16 2321  . sucralfate (CARAFATE) tablet 1 g  1 g Oral TID WC & HS Brenton Grills, PA-C   1 g at 07/17/16 1127  . thiamine (VITAMIN B-1) tablet 100 mg  100 mg Oral Daily Brenton Grills, PA-C   100 mg at 07/17/16 0901  . Vitamin D (Ergocalciferol) (DRISDOL) capsule 50,000 Units  50,000 Units Oral Weekly Brenton Grills, PA-C   50,000 Units at 07/11/16 1031  . zolpidem (AMBIEN) tablet 5 mg  5 mg Oral QHS PRN,MR X 1 Marina S [place], PA-C   5 mg at 07/09/16 2208    Musculoskeletal: Strength & Muscle Tone: within normal limits Gait & Station: normal Patient leans: N/A  Psychiatric Specialty Exam: Physical Exam  as per history and physical   ROS   patient denied nausea, vomiting, abdominal pain and shortness of breath. No Fever-chills, No Headache, No changes with Vision or hearing, reports vertigo No problems swallowing food or Liquids, No Chest pain, Cough or Shortness of Breath, No Abdominal pain, No Nausea or Vommitting, Bowel movements are regular, No Blood in stool or Urine, No dysuria, No new skin rashes or bruises, No new joints pains-aches,  No new weakness, tingling, numbness in any extremity, No recent weight gain or loss, No polyuria, polydypsia or polyphagia,  A full 10 point Review of Systems was done, except as stated above, all other Review of Systems were negative.  Blood pressure 115/65, pulse 90, temperature 98.6 F (37 C), temperature source Oral, resp. rate 18, height 5' 11"  (1.803 m), weight 80.6 kg (177 lb 9.6 oz), SpO2 94 %.Body mass index is 24.77 kg/m.  General Appearance: Guarded  Eye Contact:  Good  Speech:  Clear and Coherent  Volume:  Normal  Mood:  Angry and Depressed  Affect:  Appropriate  Thought Process:  Coherent and Goal Directed  Orientation:  Full (Time, Place, and Person)  Thought Content:  Hallucinations: Auditory and Rumination  Suicidal Thoughts:  Yes.  with intent/plan  Homicidal Thoughts:  Yes.  with intent/plan  Memory:  Immediate;   Good Recent;  Fair Remote;   Fair  Judgement:  Impaired  Insight:  Fair  Psychomotor Activity:  Decreased  Concentration:  Concentration: Fair and Attention Span: Fair  Recall:  [employer] of Knowledge:  Good  Language:  Good  Akathisia:  Negative  Handed:  Right  AIMS (if indicated):     Assets:  Communication Skills Desire for Improvement Housing Leisure Time Resilience Social Support  ADL's:  Intact  Cognition:  WNL  Sleep:        Treatment Plan Summary: 56 years old male with chronic mental illness especially with schizoaffective disorder/bipolar disorder, substance abuse especially cocaine and alcohol presented with cocaine  intoxication, shortness of breath, decreased need for sleep increased agitation and anger and also reports suicidal/homicidal ideation with the plan of using a gun. Patient reported he has an access to the gun which was put away. Patient Brother was on run according to him.  Patient has been medically cleared and has no symptoms of alcohol withdrawal or cocaine crash Appreciate LCSW and case manager are working on finding appropriate placement for this patient  Depakote DR 500 mg twice daily for mood swings, check valproic acid level if not done Prolixin 10 mg daily morning and 20 mg at bedtime for hallucinations and psychosis, check prolactin level Seroquel 100 mg daily and benztropine 1 mg twice daily for EPS Social service to contact and [facility] ACT team fo collateral information and assistance with the disposition plans  Appreciate psychiatric consultation and follow up as clinically required Please contact [phone] or [phone] if needs further assistance  Disposition: Recommend psychiatric Inpatient admission when medically cleared. Supportive therapy provided about ongoing stressors.  Ambrose Finland, MD 07/17/2016 12:30 PM

## 2016-07-18 LAB — LIPID PANEL
CHOL/HDL RATIO: 3 ratio
CHOLESTEROL: 195 mg/dL (ref 0–200)
HDL: 65 mg/dL (ref >40)
LDL CALC: 111 mg/dL — AB (ref 0–99)
Triglycerides: 95 mg/dL (ref <150)
VLDL: 19 mg/dL (ref 0–40)

## 2016-07-18 LAB — HEPATIC FUNCTION PANEL
ALBUMIN: 2.9 g/dL — AB (ref 3.5–5.0)
ALK PHOS: 57 U/L (ref 38–126)
ALT: 17 U/L (ref 17–63)
AST: 19 U/L (ref 15–41)
Bilirubin, Direct: <0.1 mg/dL — ABNORMAL LOW (ref 0.1–0.5)
TOTAL PROTEIN: 5.8 g/dL — AB (ref 6.5–8.1)
Total Bilirubin: 0.4 mg/dL (ref 0.3–1.2)

## 2016-07-18 LAB — VALPROIC ACID LEVEL: Valproic Acid Lvl: 54 ug/mL (ref 50.0–100.0)

## 2016-07-18 LAB — GLUCOSE, CAPILLARY: GLUCOSE-CAPILLARY: 195 mg/dL — AB (ref 65–99)

## 2016-07-18 NOTE — Progress Notes (Signed)
CSW continuing to follow for inpatient psych placement- referrals have been sent to all of the following facilities and no beds available at this time.  [facility]  [facility]  CSW is continuing to follow  Burna SisJenna H. Adrik Khim, LCSW Clinical Social Worker [phone]

## 2016-07-18 NOTE — Progress Notes (Signed)
Vital signs reviewed. Still awaiting placement. No new problems reported today by patient.  Gen: pt in nad, alert and awake Cv: no cyanosis Pulm: no increased wob  Francisco Beard, [employer]

## 2016-07-18 NOTE — Progress Notes (Signed)
Patient requested that his blood sugar be checked

## 2016-07-19 DIAGNOSIS — F1490 Cocaine use, unspecified, uncomplicated: Secondary | ICD-10-CM

## 2016-07-19 LAB — PROLACTIN: Prolactin: 53.1 ng/mL — ABNORMAL HIGH (ref 4.0–15.2)

## 2016-07-19 MED ORDER — HYDROCODONE-ACETAMINOPHEN 5-325 MG PO TABS
1.0000 | ORAL_TABLET | Freq: Four times a day (QID) | ORAL | Status: DC | PRN
  Administered 2016-07-19 – 2016-07-24 (×9): 2 via ORAL
  Filled 2016-07-19 (×9): qty 2

## 2016-07-19 NOTE — Progress Notes (Signed)
Pt stable. He reports no new complaints  Awaiting placement  Francisco Beard

## 2016-07-19 NOTE — Consult Note (Signed)
Francisco Beard, Francisco Beard, Francisco Beard) [J44.1, J45.901] 07/09/2016  . GERD (gastroesophageal reflux disease) [K21.9] 07/09/2016  . Polysubstance Beard [F19.10] 07/09/2016  . Homicidal ideation [R45.850]   . Bipolar disorder (HCC) [F31.9] 04/17/2016  . HTN (hypertension) [I10] 04/17/2016  . Chest pain [R07.9]   . COPD exacerbation (HCC) [J44.1] 04/13/2016  . COPD (chronic obstructive pulmonary disease) (HCC) [J44.9] 04/13/2016  . Avascular necrosis of bone of right hip (HCC) [M87.051] 04/08/2016  . COPD with acute exacerbation (HCC) [J44.1] 04/01/2016  . Suicidal ideations [R45.851] 08/17/2013   Total Time spent with patient: 30 minutes  Subjective:   Francisco Beard is a 56 y.o. male patient admitted with progressive shortness of breath, intoxication with illicit substances.  HPI:  [name] is a 56 year old male with schizoaffective disorder, admitted forprogressive shortness of breath. He has been abusing cocaine prior to admission,and experiencing increased psychotic symptoms associated with his Francisco use. He believes that his mother was murdered by his brother because of a financial conflict, and states clearly that he intends to murder his brother. He has a history of malingering and making suicidal and homicidal threats, but has been persistent in his threats during this hospitalization. He is followed by an ACT team in [place], [place] [facility] life.   Past Psychiatric History: Schizoaffective disorder, cocaine and alcohol Beard versus dependence and questionable noncompliant with medications and malingering. Patient has multiple acute  psychiatric hospitalization at [facility] in his last hospitalization was 05/28/2016.  07/17/2016 Interval history: I spent time discussing with the patient at bedside. He engaged with Clinical research associate, and continue to perseverate on his brother murdering his mother. He made remarks about how "I am very violent" and "I can go off like a firecracker."  He engages bizarrely.  He reports that he is continued to have auditory Beard in hospital and at points, thought the sitter was talking to him, when nobody was talking.  He passive endorses suicidal thoughts.  He shares that "I just don't know day to day."  He smiles inappropriately throughout our interaction.   I attempted to call emergency contacts to gain collateral, but the lines listed in his record are disconnected or not in service.  Risk to Self: Is patient at risk for suicide?: Yes Risk to Others:  Yes Prior Inpatient Therapy:   Yes Prior Outpatient Therapy:  Yes -ACTT  Past Medical History:  Past Medical History:  Diagnosis Date  . Anxiety   . Arthritis    "hips; bottom part of my back" (07/09/2016)  . Asthmatic bronchitis   . Bipolar disorder (HCC)   . CHF (congestive heart failure) (HCC)   . Chronic bronchitis (HCC)   . Chronic hip pain   . COPD (chronic obstructive pulmonary disease) (HCC)   . Depression   . GERD (gastroesophageal reflux disease)   . High cholesterol   . History of hiatal hernia   . Hypertension   . Irregular heart beats   . Pneumonia    "3 times; it's been awhile" (07/09/2016)  . Schizophrenia (HCC)   . Type II diabetes mellitus (HCC)     Past Surgical History:  Procedure Laterality Date  . CLOSED REDUCTION HAND FRACTURE Right    "  got 6 screws and a plate in there"  . ELBOW FRACTURE SURGERY Left    "put it through the wall"  . FRACTURE SURGERY     Family History: History reviewed. No pertinent family history. Family Psychiatric  History: Noncontributory Social History:   History  Alcohol Use  . 12.0 oz/week  . 20 Standard drinks or equivalent per week    Comment: 07/09/2016 "I had quit for 1 yr, relapsed 3 days ago"     History  Drug Use  . Types: "Crack" cocaine    Comment: 07/09/2016 "I had quit for 1 yr, relapsed 3 days ago"    Social History   Social History  . Marital status: Divorced    Spouse name: N/A  . Number of children: N/A  . Years of education: N/A   Social History Main Topics  . Smoking status: Former Smoker    Packs/day: 3.00    Years: 41.00    Types: Cigarettes    Quit date: 04/08/2016  . Smokeless tobacco: Never Used  . Alcohol use 12.0 oz/week    20 Standard drinks or equivalent per week     Comment: 07/09/2016 "I had quit for 1 yr, relapsed 3 days ago"  . Drug use: Yes    Types: "Crack" cocaine     Comment: 07/09/2016 "I had quit for 1 yr, relapsed 3 days ago"  . Sexual activity: Not Currently   Other Topics Concern  . None   Social History Narrative  . None   Additional Social History:    Allergies:   Allergies  Allergen Reactions  . Strawberry Extract Anaphylaxis  . Tomato Anaphylaxis  . Aleve [Naproxen] Other (See Comments)    Heart problems so MD told him to not take  . Aspirin Swelling  . Ibuprofen Swelling  . Penicillins Swelling    Has patient had a PCN reaction causing immediate rash, facial/tongue/throat swelling, SOB or lightheadedness with hypotension: Yes Has patient had a PCN reaction causing severe rash involving mucus membranes or skin necrosis: Yes Has patient had a PCN reaction that required hospitalization: Yes Has patient had a PCN reaction occurring within the last 10 years: No If all of the above answers are "NO", then may proceed with Cephalosporin use.   . Seroquel [Quetiapine] Other (See Comments)    In higher doses, this causes excessive sedation  . Onion Nausea And Vomiting and Rash    Labs:  Results for orders placed or performed during the hospital encounter of 07/09/16 (from  the past 48 hour(s))  Valproic acid level     Status: None   Collection Time: 07/18/16  5:02 AM  Result Value Ref Range   Valproic Acid Lvl 54 50.0 - 100.0 ug/mL  Prolactin     Status: Abnormal   Collection Time: 07/18/16  5:02 AM  Result Value Ref Range   Prolactin 53.1 (H) 4.0 - 15.2 ng/mL    Comment: (NOTE) Performed At: [facility] [address] [place], [place] [id number] Mila Homer MD WU:[id number]   Hepatic function panel     Status: Abnormal   Collection Time: 07/18/16  5:02 AM  Result Value Ref Range   Total Protein 5.8 (L) 6.5 - 8.1 g/dL   Albumin 2.9 (L) 3.5 - 5.0 g/dL   AST 19 15 - 41 U/L   ALT 17 17 - 63 U/L   Alkaline Phosphatase 57 38 - 126 U/L   Total Bilirubin 0.4 0.3 - 1.2 mg/dL   Bilirubin,  Direct <0.1 (L) 0.1 - 0.5 mg/dL   Indirect Bilirubin NOT CALCULATED 0.3 - 0.9 mg/dL  Lipid panel     Status: Abnormal   Collection Time: 07/18/16  5:02 AM  Result Value Ref Range   Cholesterol 195 0 - 200 mg/dL   Triglycerides 95 <409<150 mg/dL   HDL 65 >81>40 mg/dL   Total CHOL/HDL Ratio 3.0 RATIO   VLDL 19 0 - 40 mg/dL   LDL Cholesterol 191111 (H) 0 - 99 mg/dL    Comment:        Total Cholesterol/HDL:CHD Risk Coronary Heart Disease Risk Table                     Men   Women  1/2 Average Risk   3.4   3.3  Average Risk       5.0   4.4  2 X Average Risk   9.6   7.1  3 X Average Risk  23.4   11.0        Use the calculated Patient Ratio above and the CHD Risk Table to determine the patient's CHD Risk.        ATP III CLASSIFICATION (LDL):  <100     mg/dL   Optimal  [id number]  mg/dL   Near or Above                    Optimal  130-159  mg/dL   Borderline  [phone]  mg/dL   High  >657>190     mg/dL   Very High   Glucose, capillary     Status: Abnormal   Collection Time: 07/18/16  3:11 PM  Result Value Ref Range   Glucose-Capillary 195 (H) 65 - 99 mg/dL    Current Facility-Administered Medications  Medication Dose Route Frequency Provider Last Rate Last Dose   . acetaminophen (TYLENOL) tablet 650 mg  650 mg Oral Q6H PRN Rolly SalterPranav M Patel, MD      . alum & mag hydroxide-simeth (MAALOX/MYLANTA) 200-200-20 MG/5ML suspension 15 mL  15 mL Oral Q4H PRN Rolly SalterPranav M Patel, MD      . amLODipine (NORVASC) tablet 5 mg  5 mg Oral Daily Isaiah BlakesMarina S Kyazimova, PA-C   5 mg at 07/19/16 [id number]  . atorvastatin (LIPITOR) tablet 20 mg  20 mg Oral q1800 Isaiah BlakesMarina S Kyazimova, PA-C   20 mg at 07/18/16 1729  . benztropine (COGENTIN) tablet 1 mg  1 mg Oral BID Leata MouseJanardhana Jonnalagadda, MD   1 mg at 07/19/16 [id number]  . divalproex (DEPAKOTE) DR tablet 500 mg  500 mg Oral BID Isaiah BlakesMarina S Kyazimova, PA-C   500 mg at 07/19/16 [id number]  . enoxaparin (LOVENOX) injection 40 mg  40 mg Subcutaneous Q24H Marina S [place] Lake[place], PA-C      . famotidine (PEPCID) tablet 20 mg  20 mg Oral Daily Rolly SalterPranav M Patel, MD   20 mg at 07/19/16 [id number]  . feeding supplement (ENSURE ENLIVE) (ENSURE ENLIVE) liquid 237 mL  237 mL Oral BID BM Rolly SalterPranav M Patel, MD   237 mL at 07/19/16 0920  . ferrous gluconate (FERGON) tablet 324 mg  324 mg Oral Daily Isaiah BlakesMarina S Kyazimova, PA-C   324 mg at 07/19/16 0919  . fluPHENAZine (PROLIXIN) tablet 10 mg  10 mg Oral Daily Gerhard MunchKimberly B Hammons, RPH   10 mg at 07/19/16 [id number]   And  . fluPHENAZine (PROLIXIN) tablet 20 mg  20 mg Oral QHS Kimberly B Hammons, RPH   20 mg at  07/18/16 2119  . folic acid (FOLVITE) tablet 1 mg  1 mg Oral Daily Ancil Linsey [place], PA-C   1 mg at 07/19/16 6962  . HYDROcodone-acetaminophen (NORCO/VICODIN) 5-325 MG per tablet 1-2 tablet  1-2 tablet Oral Q6H PRN Penny Pia, MD   2 tablet at 07/19/16 1312  . isosorbide mononitrate (IMDUR) 24 hr tablet 30 mg  30 mg Oral Daily Rolly Salter, MD   30 mg at 07/19/16 0919  . loratadine (CLARITIN) tablet 10 mg  10 mg Oral Daily PRN Isaiah Blakes, PA-C      . menthol-cetylpyridinium (CEPACOL) lozenge 3 mg  1 lozenge Oral PRN Rolly Salter, MD      . multivitamin with minerals tablet 1 tablet  1 tablet Oral Daily Isaiah Blakes, PA-C    1 tablet at 07/19/16 9528  . nitroGLYCERIN (NITROSTAT) SL tablet 0.4 mg  0.4 mg Sublingual Q5 min PRN Rolly Salter, MD   0.4 mg at 07/16/16 1131  . ondansetron (ZOFRAN) tablet 4 mg  4 mg Oral Q6H PRN Isaiah Blakes, PA-C       Or  . ondansetron [facility]) injection 4 mg  4 mg Intravenous Q6H PRN Isaiah Blakes, PA-C      . pantoprazole (PROTONIX) EC tablet 40 mg  40 mg Oral BID Isaiah Blakes, PA-C   40 mg at 07/19/16 0919  . phenol (CHLORASEPTIC) mouth spray 1 spray  1 spray Mouth/Throat PRN Rolly Salter, MD   1 spray at 07/16/16 0150  . polyethylene glycol (MIRALAX / GLYCOLAX) packet 17 g  17 g Oral Daily PRN Isaiah Blakes, PA-C   17 g at 07/14/16 1026  . predniSONE (DELTASONE) tablet 40 mg  40 mg Oral Q breakfast Rolly Salter, MD   40 mg at 07/19/16 0709  . QUEtiapine (SEROQUEL) tablet 100 mg  100 mg Oral QHS Leata Mouse, MD   100 mg at 07/18/16 2114  . sucralfate (CARAFATE) tablet 1 g  1 g Oral TID WC & HS Isaiah Blakes, PA-C   1 g at 07/19/16 1209  . thiamine (VITAMIN B-1) tablet 100 mg  100 mg Oral Daily Isaiah Blakes, PA-C   100 mg at 07/19/16 0919  . Vitamin D (Ergocalciferol) (DRISDOL) capsule 50,000 Units  50,000 Units Oral Weekly Isaiah Blakes, PA-C   50,000 Units at 07/18/16 1025  . zolpidem (AMBIEN) tablet 5 mg  5 mg Oral QHS PRN,MR X 1 Marina S [place], PA-C   5 mg at 07/09/16 2208    Musculoskeletal: Strength & Muscle Tone: within normal limits Gait & Station: normal Patient leans: N/A  Psychiatric Specialty Exam: Physical Exam as per history and physical   ROS patient denied nausea, vomiting, abdominal pain and shortness of breath. No Fever-chills, No Headache, No changes with Vision or hearing, reports vertigo No problems swallowing food or Liquids, No Chest pain, Cough or Shortness of Breath, No Abdominal pain, No Nausea or Vommitting, Bowel movements are regular, No Blood in stool or Urine, No dysuria, No new skin  rashes or bruises, No new joints pains-aches,  No new weakness, tingling, numbness in any extremity, No recent weight gain or loss, No polyuria, polydypsia or polyphagia,  A full 10 point Review of Systems was done, except as stated above, all other Review of Systems were negative.  Blood pressure 113/71, pulse 80, temperature 98.4 F (36.9 C), temperature source Oral, resp. rate (!) 22, height 5\' 11"  (1.803 m), weight  77.1 kg (170 lb), SpO2 96 %.Body mass index is 23.71 kg/m.  General Appearance: Guarded  Eye Contact:  Fair  Speech:  Garbled and Normal Rate  Volume:  Decreased  Mood:  Angry  Affect:  Constricted and smiles inappropriately throughout discussion  Thought Process:  Disorganized  Orientation:  Full (Time, Place, and Person)  Thought Content:  Beard: Auditory and Rumination  Suicidal Thoughts:  Yes.  without intent/plan  Homicidal Thoughts:  Yes.  with intent/plan  Memory:  Immediate;   Good Recent;   Fair Remote;   Fair  Judgement:  Impaired  Insight:  Fair  Psychomotor Activity:  Decreased  Concentration:  Concentration: Fair and Attention Span: Fair  Recall:  [employer] of Knowledge:  Good  Language:  Good  Akathisia:  Negative  Handed:  Right  AIMS (if indicated):     Assets:  Communication Skills Desire for Improvement Housing Leisure Time Resilience Social Support  ADL's:  Intact  Cognition:  WNL  Sleep:       Treatment Plan Summary:   Mr. Graeff is a 56 year old male with severe persistent mental illness, schizoaffective disorder-bipolar type and Francisco use disorder (cocaine and alcohol).  He was admitted to the medical service with cocaine intoxication and progressive shortness of breath.  He shares grief regarding the alleged murder of his mother by his brother as recently as 6 days prior to hospitalization. He reports that he has homicidal ideation towards his brother and intends to shoot him.  The patient has continued to experience  psychotic symptoms including auditory Beard and paranoia and hospital. He requires psychiatric hospitalization after he is medically cleared.    # Schizoaffective Disorder, Bipolar Type Depakote DR 500 mg twice daily for mood swings, check valproic acid level if not done Prolixin 10 mg daily morning and 20 mg at bedtime for Beard and psychosis, check prolactin level Seroquel 100 mg daily and benztropine 1 mg twice daily for EPS Social service to contact and [facility] ACT team fo collateral information and assistance with the disposition plans Appreciate CSW continued involvement; it appears response from referrals is still pending at this time   Disposition: Recommend psychiatric Inpatient admission when medically cleared. Supportive therapy provided about ongoing stressors.  Burnard Leigh, MD 07/19/2016 4:50 PM

## 2016-07-20 NOTE — Progress Notes (Signed)
Still awaiting placement. Pt has no new complaints.  Francisco Beard, [employer]

## 2016-07-21 LAB — GLUCOSE, CAPILLARY: Glucose-Capillary: 196 mg/dL — ABNORMAL HIGH (ref 65–99)

## 2016-07-21 NOTE — Progress Notes (Signed)
No new complaints. Still waiting for placement  Delva Derden, [facility]

## 2016-07-22 NOTE — Progress Notes (Signed)
No new complaints reported by patient. VSS  Awaiting placement  Francisco Beard, [facility] HurryLANDO

## 2016-07-22 NOTE — Progress Notes (Signed)
[facility] with [facility]- they have reviewed and denied patient due to frequent readmissions  CSW will continue to follow and will follow up with other options  Burna SisJenna H. Jalea Bronaugh, LCSW Clinical Social Worker [phone]

## 2016-07-23 LAB — CREATININE, SERUM
CREATININE: 0.97 mg/dL (ref 0.61–1.24)
GFR calc non Af Amer: >60 mL/min (ref >60)

## 2016-07-23 MED ORDER — DOCUSATE SODIUM 100 MG PO CAPS
100.0000 mg | ORAL_CAPSULE | Freq: Once | ORAL | Status: AC
  Administered 2016-07-23: 100 mg via ORAL
  Filled 2016-07-23: qty 1

## 2016-07-23 NOTE — Progress Notes (Signed)
Patient stable for medical discharge. Awaiting bed placement into psychiatric unit (hearing voices- reportedly homocidal)  Patient has no new complaints today. No chest pain reported  No increased work of breathing  Vital signs stable  Gen.: Patient in no acute distress Pulmonary: No increased work of breathing, equal chest rise, no wheezes Cardiovascular: No cyanosis  Once bed available and inpatient psychiatric unit will plan on discharging to inpatient psychiatric unit.  Francisco Beard, [employer]

## 2016-07-23 NOTE — Progress Notes (Signed)
Application for [facility] completed and sent to intake for review- awaiting decision   Burna SisJenna H. Taya Ashbaugh, LCSW Clinical Social Worker [phone]

## 2016-07-24 DIAGNOSIS — I10 Essential (primary) hypertension: Secondary | ICD-10-CM

## 2016-07-24 LAB — BASIC METABOLIC PANEL
Anion gap: 13 (ref 5–15)
BUN: 15 mg/dL (ref 6–20)
CALCIUM: 8.9 mg/dL (ref 8.9–10.3)
CO2: 26 mmol/L (ref 22–32)
CREATININE: 1 mg/dL (ref 0.61–1.24)
Chloride: 106 mmol/L (ref 101–111)
GFR calc Af Amer: >60 mL/min (ref >60)
Glucose, Bld: 126 mg/dL — ABNORMAL HIGH (ref 65–99)
Potassium: 3.5 mmol/L (ref 3.5–5.1)
SODIUM: 145 mmol/L (ref 135–145)

## 2016-07-24 LAB — VALPROIC ACID LEVEL: VALPROIC ACID LVL: 46 ug/mL — AB (ref 50.0–100.0)

## 2016-07-24 LAB — MAGNESIUM: MAGNESIUM: 2.1 mg/dL (ref 1.7–2.4)

## 2016-07-24 MED ORDER — IPRATROPIUM-ALBUTEROL 0.5-2.5 (3) MG/3ML IN SOLN
3.0000 mL | Freq: Four times a day (QID) | RESPIRATORY_TRACT | Status: DC
  Administered 2016-07-24: 3 mL via RESPIRATORY_TRACT
  Filled 2016-07-24: qty 3

## 2016-07-24 MED ORDER — PANTOPRAZOLE SODIUM 40 MG PO TBEC: 40.0000 mg | DELAYED_RELEASE_TABLET | Freq: Two times a day (BID) | ORAL | 0 refills | Status: DC

## 2016-07-24 MED ORDER — POTASSIUM CHLORIDE CRYS ER 20 MEQ PO TBCR
40.0000 meq | EXTENDED_RELEASE_TABLET | Freq: Once | ORAL | Status: AC
  Administered 2016-07-24: 40 meq via ORAL
  Filled 2016-07-24: qty 2

## 2016-07-24 MED ORDER — MOMETASONE FURO-FORMOTEROL FUM 200-5 MCG/ACT IN AERO
1.0000 | INHALATION_SPRAY | Freq: Two times a day (BID) | RESPIRATORY_TRACT | Status: DC
  Administered 2016-07-24: 1 via RESPIRATORY_TRACT
  Filled 2016-07-24 (×2): qty 8.8

## 2016-07-24 MED ORDER — IPRATROPIUM-ALBUTEROL 0.5-2.5 (3) MG/3ML IN SOLN: 3.0000 mL | RESPIRATORY_TRACT | Status: DC | PRN

## 2016-07-24 MED ORDER — BENZTROPINE MESYLATE 1 MG PO TABS: 1.0000 mg | ORAL_TABLET | Freq: Two times a day (BID) | ORAL | 0 refills | Status: DC

## 2016-07-24 MED ORDER — ALBUTEROL SULFATE (2.5 MG/3ML) 0.083% IN NEBU: 2.5000 mg | INHALATION_SOLUTION | RESPIRATORY_TRACT | 0 refills | Status: DC | PRN

## 2016-07-24 MED ORDER — ISOSORBIDE MONONITRATE ER 30 MG PO TB24: 30.0000 mg | ORAL_TABLET | Freq: Every day | ORAL | 2 refills | Status: DC

## 2016-07-24 MED ORDER — PREDNISONE 20 MG PO TABS
20.0000 mg | ORAL_TABLET | Freq: Every day | ORAL | Status: DC
  Administered 2016-07-24: 20 mg via ORAL

## 2016-07-24 MED ORDER — THEOPHYLLINE ER 300 MG PO CP24
300.0000 mg | ORAL_CAPSULE | Freq: Every day | ORAL | Status: DC
  Administered 2016-07-24: 300 mg via ORAL
  Filled 2016-07-24: qty 1

## 2016-07-24 MED ORDER — POLYETHYLENE GLYCOL 3350 17 G PO PACK
17.0000 g | PACK | Freq: Two times a day (BID) | ORAL | Status: DC
  Administered 2016-07-24: 17 g via ORAL
  Filled 2016-07-24: qty 1

## 2016-07-24 MED ORDER — THIAMINE HCL 100 MG PO TABS: 100.0000 mg | ORAL_TABLET | Freq: Every day | ORAL | 0 refills | Status: DC

## 2016-07-24 MED ORDER — PREDNISONE 20 MG PO TABS: 20.0000 mg | ORAL_TABLET | Freq: Every day | ORAL | 0 refills | Status: DC

## 2016-07-24 MED ORDER — TIOTROPIUM BROMIDE MONOHYDRATE 18 MCG IN CAPS
18.0000 ug | ORAL_CAPSULE | Freq: Every day | RESPIRATORY_TRACT | Status: DC
  Administered 2016-07-24: 18 ug via RESPIRATORY_TRACT
  Filled 2016-07-24: qty 5

## 2016-07-24 MED ORDER — POLYETHYLENE GLYCOL 3350 17 G PO PACK: 17.0000 g | PACK | Freq: Every day | ORAL | 0 refills | Status: DC

## 2016-07-24 MED ORDER — QUETIAPINE FUMARATE 100 MG PO TABS: 100.0000 mg | ORAL_TABLET | Freq: Every day | ORAL | 0 refills | Status: DC

## 2016-07-24 NOTE — Consult Note (Signed)
[facility] Consult   Reason for Consult:  Hallucinations, substance abuse, suicidal/homicidal ideation Referring Physician:  Dr. Tana Coast Patient Identification: Francisco Beard MRN:  [id number] Principal Diagnosis: Acute exacerbation of COPD with asthma [facility]) Diagnosis:   Patient Active Problem List   Diagnosis Date Noted  . Acute exacerbation of COPD with asthma ([place]) [J44.1, I34.742] 07/09/2016  . GERD (gastroesophageal reflux disease) [K21.9] 07/09/2016  . Polysubstance abuse [F19.10] 07/09/2016  . Homicidal ideation [R45.850]   . Bipolar disorder ([place]) [F31.9] 04/17/2016  . HTN (hypertension) [I10] 04/17/2016  . Chest pain [R07.9]   . COPD exacerbation ([place]) [J44.1] 04/13/2016  . COPD (chronic obstructive pulmonary disease) ([place]) [J44.9] 04/13/2016  . Avascular necrosis of bone of right hip ([place]) [M87.051] 04/08/2016  . COPD with acute exacerbation ([place]) [J44.1] 04/01/2016  . Suicidal ideations [R45.851] 08/17/2013    Total Time spent with patient: 1 hour  Subjective:   Francisco Beard is a 56 y.o. male patient admitted with progressive shortness of breath, intoxication with illicit substances.  HPI:  Francisco Beard a 56 y.o.male, seen, chart reviewed for this face-to-face psychiatric consultation and evaluation of increased symptoms of bipolar depression, psychosis, relapsed on drugs of abuse which is cocaine and also reported suicidal/homicidal ideation. Patient stated that he has been living by himself in an apartment and has a friends who is, around with alcohol and cocaine. Patient reported he spent about $400 worth of cocaine and hallucinating and could not sleep before coming to the hospital. Patient also reported he was moved out of his mother's home to an apartment by his brother moved into his mother's home. Reportedly patient stated his mother were shocked by his brother because of conflict regarding money. Patient reportedly was upset, angry and won't kill his  brother but ended up relapsing his drug of abuse which is alcohol and cocaine which makes him more psychotic with hallucinations. Review of his medical records from [facility] indicated patient has been diagnosed with a schizoaffective disorder/bipolar disorder and also more frequently visit the hospital with malingering hallucinations, suicidal/homicidal ideations. Patient usually well within few days and then go home. Patient is requesting to be sent him to [facility] for long-term psychiatric hospitalization. Urine drug screen indicated positive for cocaine and blood alcohol is not significant. Patient does not know what medication he takes but reportedly ACT team is following up with him and acting staff review his medications. This and did not contact his acting members since came to the hospital. She reportedly deceived acting services from [facility]" and reportedly treated by Dr. Gae Bon.  Past Psychiatric History: Schizoaffective disorder, cocaine and alcohol abuse versus dependence and questionable noncompliant with medications and malingering. Patient has multiple acute psychiatric hospitalization at [facility] in his last hospitalization was 05/28/2016.  07/24/2016 Interval history: Patient seen for psychiatric consultation follow-up, safety sitter is at bedside. Patient appeared calm, cooperative and pleasant during this evaluation. Patient reported he is feeling much better and not having any symptoms of depression, mania, auditory or visual hallucinations, delusions or paranoia. Patient has denied active suicidal/homicidal ideation, intention or plans. Patient reported he would like to be discharged with outpatient care of his psychiatric providers including acting services.   Case discussed with the staff RN who reported patient has been a good patient not giving any behavioral or emotional problems. She presented with bright affect,  appropriately responding to communication and has normal thought process which is linear and goal-directed. Patient is  willing to contact his family or friends who can provide the transportation back to his place. Patient also denied craving for drugs of abuse and alcohol. Patient stated he is not going to harm himself or his brother which is main complaint when he came in.   Patient is also known for chronic schizoaffective disorder and also polysubstance abuse and malingering behaviors. Patient was restarted on his home medication, which has been compliant since admitted to the hospital on 07/09/2016 and has no reported adverse effect of the medication except for mild sedation.  Risk to Self: Is patient at risk for suicide?: Yes Risk to Others:   Prior Inpatient Therapy:   Prior Outpatient Therapy:    Past Medical History:  Past Medical History:  Diagnosis Date  . Anxiety   . Arthritis    "hips; bottom part of my back" (07/09/2016)  . Asthmatic bronchitis   . Bipolar disorder ([place])   . CHF (congestive heart failure) ([place])   . Chronic bronchitis ([place])   . Chronic hip pain   . COPD (chronic obstructive pulmonary disease) ([place])   . Depression   . GERD (gastroesophageal reflux disease)   . High cholesterol   . History of hiatal hernia   . Hypertension   . Irregular heart beats   . Pneumonia    "3 times; it's been awhile" (07/09/2016)  . Schizophrenia ([place])   . Type II diabetes mellitus ([place])     Past Surgical History:  Procedure Laterality Date  . CLOSED REDUCTION HAND FRACTURE Right    "got 6 screws and a plate in there"  . ELBOW FRACTURE SURGERY Left    "put it through the wall"  . FRACTURE SURGERY     Family History: History reviewed. No pertinent family history. Family Psychiatric  History: Noncontributory Social History:  History  Alcohol Use  . 12.0 oz/week  . 20 Standard drinks or equivalent per week    Comment: 07/09/2016 "I had quit for 1 yr, relapsed 3 days ago"      History  Drug Use  . Types: "Crack" cocaine    Comment: 07/09/2016 "I had quit for 1 yr, relapsed 3 days ago"    Social History   Social History  . Marital status: Divorced    Spouse name: N/A  . Number of children: N/A  . Years of education: N/A   Social History Main Topics  . Smoking status: Former Smoker    Packs/day: 3.00    Years: 41.00    Types: Cigarettes    Quit date: 04/08/2016  . Smokeless tobacco: Never Used  . Alcohol use 12.0 oz/week    20 Standard drinks or equivalent per week     Comment: 07/09/2016 "I had quit for 1 yr, relapsed 3 days ago"  . Drug use: Yes    Types: "Crack" cocaine     Comment: 07/09/2016 "I had quit for 1 yr, relapsed 3 days ago"  . Sexual activity: Not Currently   Other Topics Concern  . None   Social History Narrative  . None   Additional Social History:    Allergies:   Allergies  Allergen Reactions  . Strawberry Extract Anaphylaxis  . Tomato Anaphylaxis  . Aleve [Naproxen] Other (See Comments)    Heart problems so MD told him to not take  . Aspirin Swelling  . Ibuprofen Swelling  . Penicillins Swelling    Has patient had a PCN reaction causing immediate rash, facial/tongue/throat swelling, SOB or lightheadedness with hypotension: Yes  Has patient had a PCN reaction causing severe rash involving mucus membranes or skin necrosis: Yes Has patient had a PCN reaction that required hospitalization: Yes Has patient had a PCN reaction occurring within the last 10 years: No If all of the above answers are "NO", then may proceed with Cephalosporin use.   . Seroquel [Quetiapine] Other (See Comments)    In higher doses, this causes excessive sedation  . Onion Nausea And Vomiting and Rash    Labs:  Results for orders placed or performed during the hospital encounter of 07/09/16 (from the past 48 hour(s))  Creatinine, serum     Status: None   Collection Time: 07/23/16  5:28 AM  Result Value Ref Range   Creatinine, Ser 0.97 0.61 - 1.24  mg/dL   GFR calc non Af Amer >60 >60 mL/min   GFR calc Af Amer >60 >60 mL/min    Comment: (NOTE) The eGFR has been calculated using the CKD EPI equation. This calculation has not been validated in all clinical situations. eGFR's persistently <60 mL/min signify possible Chronic Kidney Disease.   Valproic acid level     Status: Abnormal   Collection Time: 07/24/16  8:45 AM  Result Value Ref Range   Valproic Acid Lvl 46 (L) 50.0 - 100.0 ug/mL  Basic metabolic panel     Status: Abnormal   Collection Time: 07/24/16  8:45 AM  Result Value Ref Range   Sodium 145 135 - 145 mmol/L   Potassium 3.5 3.5 - 5.1 mmol/L   Chloride 106 101 - 111 mmol/L   CO2 26 22 - 32 mmol/L   Glucose, Bld 126 (H) 65 - 99 mg/dL   BUN 15 6 - 20 mg/dL   Creatinine, Ser 1.00 0.61 - 1.24 mg/dL   Calcium 8.9 8.9 - 10.3 mg/dL   GFR calc non Af Amer >60 >60 mL/min   GFR calc Af Amer >60 >60 mL/min    Comment: (NOTE) The eGFR has been calculated using the CKD EPI equation. This calculation has not been validated in all clinical situations. eGFR's persistently <60 mL/min signify possible Chronic Kidney Disease.    Anion gap 13 5 - 15  Magnesium     Status: None   Collection Time: 07/24/16  8:45 AM  Result Value Ref Range   Magnesium 2.1 1.7 - 2.4 mg/dL    Current Facility-Administered Medications  Medication Dose Route Frequency Provider Last Rate Last Dose  . acetaminophen (TYLENOL) tablet 650 mg  650 mg Oral Q6H PRN Lavina Hamman, MD      . alum & mag hydroxide-simeth (MAALOX/MYLANTA) 200-200-20 MG/5ML suspension 15 mL  15 mL Oral Q4H PRN Lavina Hamman, MD      . amLODipine (NORVASC) tablet 5 mg  5 mg Oral Daily Brenton Grills, PA-C   5 mg at 07/24/16 1028  . atorvastatin (LIPITOR) tablet 20 mg  20 mg Oral q1800 Brenton Grills, PA-C   20 mg at 07/23/16 1619  . benztropine (COGENTIN) tablet 1 mg  1 mg Oral BID Ambrose Finland, MD   1 mg at 07/24/16 1028  . divalproex (DEPAKOTE) DR tablet 500  mg  500 mg Oral BID Brenton Grills, PA-C   500 mg at 07/24/16 1027  . famotidine (PEPCID) tablet 20 mg  20 mg Oral Daily Lavina Hamman, MD   20 mg at 07/24/16 1029  . feeding supplement (ENSURE ENLIVE) (ENSURE ENLIVE) liquid 237 mL  237 mL Oral BID BM Diamantina Providence M  Posey Pronto, MD   237 mL at 07/24/16 1500  . ferrous gluconate (FERGON) tablet 324 mg  324 mg Oral Daily Brenton Grills, PA-C   324 mg at 07/24/16 1029  . fluPHENAZine (PROLIXIN) tablet 10 mg  10 mg Oral Daily Kimberly B Hammons, RPH   10 mg at 07/24/16 1029   And  . fluPHENAZine (PROLIXIN) tablet 20 mg  20 mg Oral QHS Kimberly B Hammons, RPH   20 mg at 07/23/16 2211  . folic acid (FOLVITE) tablet 1 mg  1 mg Oral Daily Sharen Hint Teller, PA-C   1 mg at 07/24/16 1029  . HYDROcodone-acetaminophen (NORCO/VICODIN) 5-325 MG per tablet 1-2 tablet  1-2 tablet Oral Q6H PRN Velvet Bathe, MD   2 tablet at 07/24/16 1032  . ipratropium-albuterol (DUONEB) 0.5-2.5 (3) MG/3ML nebulizer solution 3 mL  3 mL Nebulization Q6H Eugenie Filler, MD   3 mL at 07/24/16 1455  . ipratropium-albuterol (DUONEB) 0.5-2.5 (3) MG/3ML nebulizer solution 3 mL  3 mL Nebulization Q2H PRN Eugenie Filler, MD      . isosorbide mononitrate (IMDUR) 24 hr tablet 30 mg  30 mg Oral Daily Lavina Hamman, MD   30 mg at 07/24/16 1028  . loratadine (CLARITIN) tablet 10 mg  10 mg Oral Daily PRN Brenton Grills, PA-C      . menthol-cetylpyridinium (CEPACOL) lozenge 3 mg  1 lozenge Oral PRN Lavina Hamman, MD      . mometasone-formoterol [facility]) 200-5 MCG/ACT inhaler 1 puff  1 puff Inhalation BID Eugenie Filler, MD   1 puff at 07/24/16 1127  . multivitamin with minerals tablet 1 tablet  1 tablet Oral Daily Brenton Grills, PA-C   1 tablet at 07/24/16 1030  . nitroGLYCERIN (NITROSTAT) SL tablet 0.4 mg  0.4 mg Sublingual Q5 min PRN Lavina Hamman, MD   0.4 mg at 07/16/16 1131  . ondansetron (ZOFRAN) tablet 4 mg  4 mg Oral Q6H PRN Brenton Grills, PA-C   4 mg at 07/20/16  2332   Or  . ondansetron (ZOFRAN) injection 4 mg  4 mg Intravenous Q6H PRN Brenton Grills, PA-C      . pantoprazole (PROTONIX) EC tablet 40 mg  40 mg Oral BID Brenton Grills, PA-C   40 mg at 07/24/16 1028  . phenol (CHLORASEPTIC) mouth spray 1 spray  1 spray Mouth/Throat PRN Lavina Hamman, MD   1 spray at 07/16/16 0150  . polyethylene glycol (MIRALAX / GLYCOLAX) packet 17 g  17 g Oral BID Eugenie Filler, MD   17 g at 07/24/16 1312  . predniSONE (DELTASONE) tablet 20 mg  20 mg Oral Q breakfast Eugenie Filler, MD   20 mg at 07/24/16 1028  . QUEtiapine (SEROQUEL) tablet 100 mg  100 mg Oral QHS Ambrose Finland, MD   100 mg at 07/23/16 2213  . sucralfate (CARAFATE) tablet 1 g  1 g Oral TID WC & HS Brenton Grills, PA-C   1 g at 07/24/16 1240  . theophylline (THEO-24) 24 hr capsule 300 mg  300 mg Oral Daily Eugenie Filler, MD   300 mg at 07/24/16 1033  . thiamine (VITAMIN B-1) tablet 100 mg  100 mg Oral Daily Brenton Grills, PA-C   100 mg at 07/24/16 1029  . Vitamin D (Ergocalciferol) (DRISDOL) capsule 50,000 Units  50,000 Units Oral Weekly Brenton Grills, PA-C   50,000 Units at 07/18/16 1025  . zolpidem (AMBIEN) tablet  5 mg  5 mg Oral QHS PRN,MR X 1 Marina S Kelly, PA-C   5 mg at 07/09/16 2208    Musculoskeletal: Strength & Muscle Tone: within normal limits Gait & Station: normal Patient leans: N/A  Psychiatric Specialty Exam: Physical Exam as per history and physical   ROS patient denied nausea, vomiting, abdominal pain and shortness of breath. No Fever-chills, No Headache, No changes with Vision or hearing, reports vertigo No problems swallowing food or Liquids, No Chest pain, Cough or Shortness of Breath, No Abdominal pain, No Nausea or Vommitting, Bowel movements are regular, No Blood in stool or Urine, No dysuria, No new skin rashes or bruises, No new joints pains-aches,  No new weakness, tingling, numbness in any extremity, No recent weight  gain or loss, No polyuria, polydypsia or polyphagia,  A full 10 point Review of Systems was done, except as stated above, all other Review of Systems were negative.  Blood pressure (!) 114/58, pulse 91, temperature 98.4 F (36.9 C), temperature source Oral, resp. rate 18, height 5' 11"  (1.803 m), weight 79.7 kg (175 lb 11.3 oz), SpO2 94 %.Body mass index is 24.51 kg/m.  General Appearance: Guarded  Eye Contact:  Good  Speech:  Clear and Coherent  Volume:  Normal  Mood:  Euthymic  Affect:  Appropriate  Thought Process:  Coherent and Goal Directed  Orientation:  Full (Time, Place, and Person)  Thought Content:  WDL and Logical  Suicidal Thoughts:  No, Patient contract for safety   Homicidal Thoughts:  No, patient denied contract for safety   Memory:  Immediate;   Good Recent;   Fair Remote;   Fair  Judgement:  Intact  Insight:  Fair  Psychomotor Activity:  Normal  Concentration:  Concentration: Good and Attention Span: Good  Recall:  [place] of Knowledge:  Good  Language:  Good  Akathisia:  Negative  Handed:  Right  AIMS (if indicated):     Assets:  Communication Skills Desire for Improvement Housing Leisure Time Resilience Social Support  ADL's:  Intact  Cognition:  WNL  Sleep:        Treatment Plan Summary: 56 years old male with chronic mental illness especially with schizoaffective disorder/bipolar disorder, substance abuse especially cocaine and alcohol presented with cocaine intoxication, shortness of breath, decreased need for sleep increased agitation and anger and also reports suicidal/homicidal ideation with the plan of using a gun. Patient reported he has an access to the gun which was put away. Patient Brother was on run according to him.  Patient has showed significant improvement in his mental status and currently has no safety concerns. Patient is willing to follow up with outpatient medication management upon discharge from the hospital.  Patient has  been medically cleared and has no symptoms of alcohol withdrawal or cocaine crash Patient has been psychiatrically cleared for outpatient psychiatric services at this time Discontinue safety sitter as patient contract for safety and denies active suicidal/homicidal ideation.  Case discussed with the [facility] hospitalist Dr. Grandville Silos   Depakote DR 500 mg twice daily for mood swings, valproic acid level is 46 g per mL  - which is below therapeutic value Prolixin 10 mg daily morning and 20 mg at bedtime for hallucinations and psychosis,  -prolactin level is 53.1 which is able normal value probably secondary to his medication Prolixin  Seroquel 100 mg daily and benztropine 1 mg twice daily for EPS Follow-up with [facility] ACT team fo collateral information and assistance with  the disposition plans Appreciate psychiatric consultation and we sign off as of today Please contact [phone] or [phone] if needs further assistance   Disposition: Patient will be referred to the outpatient psychiatric services and ACT team services when medically stable. No evidence of imminent risk to self or others at present.   Patient does not meet criteria for psychiatric inpatient admission.  Ambrose Finland, MD 07/24/2016 4:51 PM

## 2016-07-24 NOTE — Progress Notes (Signed)
CS[facility] on [facility] list  CSW will continue to follow  Burna SisJenna H. Carey Johndrow, LCSW Clinical Social Worker [phone]

## 2016-07-24 NOTE — Discharge Summary (Signed)
Physician Discharge Summary  Huan Pollok ZOX:[id number] DOB: Aug 06, 1960 DOA: 07/09/2016  PCP: No PCP Per Patient  Admit date: 07/09/2016 Discharge date: 07/24/2016  Time spent: 65 minutes  Recommendations for Outpatient Follow-up:  1. Follow-up with PCP in 1-2 weeks. On follow-up patient COPD would need to be reassessed. 2. Follow-up with outpatient psychiatry/psychiatrist in 1 week.   Discharge Diagnoses:  Principal Problem:   Acute exacerbation of COPD with asthma (HCC) Active Problems:   Suicidal ideations   Bipolar disorder (HCC)   HTN (hypertension)   Chest pain   GERD (gastroesophageal reflux disease)   Polysubstance abuse   Discharge Condition: Stable and improved.  Diet recommendation: Heart healthy  Filed Weights   07/22/16 0543 07/23/16 0553 07/24/16 0019  Weight: 76.8 kg (169 lb 5 oz) 80.1 kg (176 lb 9.4 oz) 79.7 kg (175 lb 11.3 oz)    History of present illness:  Per Dr Fonnie Birkenhead is a 56 y.o. male with medical history significant of GI Bleed, Mallory Weiss tears, GERD, CHF, HTN and COPD with asthma, bipolar disorder with hallucinations who presented to the ED with c/o progressive SOB. Onset of symptoms the night prior to admission, and patient was unable to sleep d/t progressive dyspnea and cough. Patient usually uses inhalers, but recently had been out of supply.  He denied fever, chills, myalgia, rhinorrhea or watery eyes.   He has a history of cocaine use and admitted to relapsing and using it over the weekend. He is also very stressed and angry repeating that he just realized that the brother kills his mother and he is going to kill his brother.  Patient reported having flu like symptoms and subjective fever with chills since the weekend. He also c/o chest pain describing it as "a full truck of load sitting on my chest" and idigestion  ED Course: on arrival patient was  Afebrile, tachypneic , but maintained his SpO2 above 92% on RA Blood work was  unremarkable and showed mild hypokalemia, Hgb 12.9 (after recent hospitalization for GI bleed in [place] (most recent in December 13.8) Chest XRay did not reveal any acute cardiopulmonary disease  Hospital Course:  Chest pain. Initial EKG showed T-wave inversions in lead 3 and aVF as well as V5. This was new as compared to admission EKG. Repeat EKG an hour later showed normal sinus rhythm without any T-wave inversions. Cardiac enzymes which was cycled were negative. 2-D echo was unremarkable with a EF of 65-70% with no wall motion abnormalities. Patient improved clinically. Patient was placed on Mylanta as needed. Patient did not have any further chest pain. Patient will be discharged in stable and improved condition.  GERD. Patient was placed on Pepcid and a PPI, with clinical improvement.  COPD exacerbation. On admission patient was noted to be a COPD exacerbation. Influenza PCR which was done was negative. Chest x-ray was negative for any acute infiltrate. Patient was placed on a steroid taper, antibiotics of Levaquin, scheduled nebulizers. Patient improved clinically and was subsequently transitioned to oral prednisone with a prednisone taper. Patient finished a course of antibiotic shortness hospitalization. Patient will be discharged home on a prednisone taper. Outpatient follow-up.   Bipolar disorder/schizoaffective disorder with active Homicidal ideation. Patient was noted during the hospitalization to have presented with hallucinations, after cocaine binge, increased symptoms of bipolar depression and psychosis. Patient also noted to have suicidal/homicidal ideations. UDS was positive for cocaine. Patient was seen in consultation by psychiatry who recommended continuing the Ativan withdrawal protocol, continuation of  his home dose Depakote 500 mg twice daily for mood swings, Prolixin for hallucinations and psychosis, Seroquel and Cogentin was added daily. Patient was followed by  psychiatry during the hospitalization. Patient improved clinically had no further suicidal homicidal ideations and per psychiatry was no longer at risk for harming himself or others. Patient was noted to contract for safety. Patient was cleared for discharge by psychiatry. Patient is to follow-up in the outpatient psychiatric services.  If troponin negative and echo unremarkable, pt can be discharged to behavioral health whenever there is a bed available.  Polysubstance abuse Patient is UDS was positive for cocaine. Patient was also noted to have a history of alcohol use. Patient was placed on Ativan withdrawal protocol. Patient remained in stable condition. Outpatient follow-up.   Procedures:  Chest x-ray 07/09/2016  2-D echo 07/16/2016  Consultations:  Psychiatry: Dr Elsie Saas  07/10/2016  Discharge Exam: Vitals:   07/24/16 1027 07/24/16 1452  BP: (!) 100/58 (!) 114/58  Pulse:  91  Resp:  18  Temp:  98.4 F (36.9 C)    General: NAD Cardiovascular: RRR Respiratory: upper airway noise. Minimal expiratory wheezing.  Discharge Instructions   Discharge Instructions    Diet - low sodium heart healthy    Complete by:  As directed    Increase activity slowly    Complete by:  As directed      Current Discharge Medication List    START taking these medications   Details  benztropine (COGENTIN) 1 MG tablet Take 1 tablet (1 mg total) by mouth 2 (two) times daily. Qty: 60 tablet, Refills: 0    isosorbide mononitrate (IMDUR) 30 MG 24 hr tablet Take 1 tablet (30 mg total) by mouth daily. Qty: 30 tablet, Refills: 2    polyethylene glycol (MIRALAX / GLYCOLAX) packet Take 17 g by mouth daily. Qty: 14 each, Refills: 0    QUEtiapine (SEROQUEL) 100 MG tablet Take 1 tablet (100 mg total) by mouth at bedtime. Qty: 30 tablet, Refills: 0    thiamine 100 MG tablet Take 1 tablet (100 mg total) by mouth daily. Qty: 30 tablet, Refills: 0      CONTINUE these medications which have  CHANGED   Details  albuterol (PROVENTIL) (2.5 MG/3ML) 0.083% nebulizer solution Take 3 mLs (2.5 mg total) by nebulization every 4 (four) hours as needed for wheezing or shortness of breath. Qty: 10 vial, Refills: 0    pantoprazole (PROTONIX) 40 MG tablet Take 1 tablet (40 mg total) by mouth 2 (two) times daily. Qty: 60 tablet, Refills: 0    !! predniSONE (DELTASONE) 10 MG tablet Take 40mg  daily for 3days,Take 30mg  daily for 3days,Take 20mg  daily for 3days,Take 10mg  daily for 3days, then stop. Qty: 30 tablet, Refills: 0    !! predniSONE (DELTASONE) 20 MG tablet Take 1 tablet (20 mg total) by mouth daily with breakfast. Take 1 tablet daily x 3 days, then stop. Qty: 3 tablet, Refills: 0     !! - Potential duplicate medications found. Please discuss with provider.    CONTINUE these medications which have NOT CHANGED   Details  albuterol (PROVENTIL HFA;VENTOLIN HFA) 108 (90 Base) MCG/ACT inhaler Inhale 1-2 puffs into the lungs every 4 (four) hours as needed for wheezing or shortness of breath. Qty: 18 g, Refills: 0    amLODipine (NORVASC) 5 MG tablet Take 1 tablet (5 mg total) by mouth daily. RESUME IN 3 DAYS. Qty: 30 tablet, Refills: 0    Ascorbic Acid (VITAMIN C PO) Take 1  tablet by mouth daily.    atorvastatin (LIPITOR) 20 MG tablet Take 20 mg by mouth daily.    Cholecalciferol (VITAMIN D3) 1000 units CAPS Take 1 capsule by mouth daily.    divalproex (DEPAKOTE) 500 MG DR tablet Take 500 mg by mouth 2 (two) times daily.    DULoxetine (CYMBALTA) 30 MG capsule Take 30 mg by mouth daily.    famotidine (PEPCID AC) 10 MG chewable tablet Chew 10 mg by mouth 2 (two) times daily as needed for heartburn.    ferrous gluconate (FERGON) 324 MG tablet Take 324 mg by mouth daily.    fluPHENAZine (PROLIXIN) 10 MG tablet Take 10-20 mg by mouth See admin instructions. Pt takes 10mg  in am, 20mg  in pm    folic acid (FOLVITE) 1 MG tablet Take 1 mg by mouth daily.    HYDROcodone-acetaminophen  (NORCO/VICODIN) 5-325 MG tablet Take 1 tablet by mouth every 6 (six) hours as needed for moderate pain. Qty: 20 tablet, Refills: 0    loratadine (CLARITIN) 10 MG tablet Take 10 mg by mouth daily as needed for allergies.    mometasone-formoterol (DULERA) 200-5 MCG/ACT AERO Inhale 1 puff into the lungs 2 (two) times daily. Qty: 1 Inhaler, Refills: 0    ondansetron (ZOFRAN) 4 MG tablet Take 4 mg by mouth daily as needed for nausea.    sucralfate (CARAFATE) 1 g tablet Take 1 g by mouth 4 (four) times daily -  with meals and at bedtime.    theophylline (THEO-24) 300 MG 24 hr capsule Take 300 mg by mouth daily.    tiotropium (SPIRIVA HANDIHALER) 18 MCG inhalation capsule Place 1 capsule (18 mcg total) into inhaler and inhale daily. Qty: 30 capsule, Refills: 0    Vitamin D, Ergocalciferol, (DRISDOL) 50000 units CAPS capsule Take 50,000 Units by mouth once a week.    senna-docusate (SENOKOT-S) 8.6-50 MG tablet Take 2 tablets by mouth at bedtime.      STOP taking these medications     omeprazole (PRILOSEC) 20 MG capsule        Allergies  Allergen Reactions  . Strawberry Extract Anaphylaxis  . Tomato Anaphylaxis  . Aleve [Naproxen] Other (See Comments)    Heart problems so MD told him to not take  . Aspirin Swelling  . Ibuprofen Swelling  . Penicillins Swelling    Has patient had a PCN reaction causing immediate rash, facial/tongue/throat swelling, SOB or lightheadedness with hypotension: Yes Has patient had a PCN reaction causing severe rash involving mucus membranes or skin necrosis: Yes Has patient had a PCN reaction that required hospitalization: Yes Has patient had a PCN reaction occurring within the last 10 years: No If all of the above answers are "NO", then may proceed with Cephalosporin use.   . Seroquel [Quetiapine] Other (See Comments)    In higher doses, this causes excessive sedation  . Onion Nausea And Vomiting and Rash   Follow-up Information    PCP. Schedule an  appointment as soon as possible for a visit in 1 week(s).        Psychiatry. Schedule an appointment as soon as possible for a visit in 1 week(s).   Why:  f/u with your psychiatrist in 1 week.           The results of significant diagnostics from this hospitalization (including imaging, microbiology, ancillary and laboratory) are listed below for reference.    Significant Diagnostic Studies: Dg Chest Port 1 View  Result Date: 07/09/2016 CLINICAL DATA:  Difficulty breathing  and chest pain. EXAM: PORTABLE CHEST 1 VIEW COMPARISON:  Single-view of the chest 06/30/2016 and 06/24/2008. CT chest 04/13/2016. FINDINGS: Bullous emphysematous disease is seen. No edema focal airspace disease. No pneumothorax. No pleural effusion. Heart size is normal. IMPRESSION: No acute disease. Bullous emphysema. Electronically Signed   By: Drusilla Kanner M.D.   On: 07/09/2016 08:51    Microbiology: No results found for this or any previous visit (from the past 240 hour(s)).   Labs: Basic Metabolic Panel:  Recent Labs Lab 07/23/16 0528 07/24/16 0845  NA  --  145  K  --  3.5  CL  --  106  CO2  --  26  GLUCOSE  --  126*  BUN  --  15  CREATININE 0.97 1.00  CALCIUM  --  8.9  MG  --  2.1   Liver Function Tests:  Recent Labs Lab 07/18/16 0502  AST 19  ALT 17  ALKPHOS 57  BILITOT 0.4  PROT 5.8*  ALBUMIN 2.9*   No results for input(s): LIPASE, AMYLASE in the last 168 hours. No results for input(s): AMMONIA in the last 168 hours. CBC: No results for input(s): WBC, NEUTROABS, HGB, HCT, MCV, PLT in the last 168 hours. Cardiac Enzymes: No results for input(s): CKTOTAL, CKMB, CKMBINDEX, TROPONINI in the last 168 hours. BNP: BNP (last 3 results)  Recent Labs  04/01/16 1412  BNP 43.2    ProBNP (last 3 results) No results for input(s): PROBNP in the last 8760 hours.  CBG:  Recent Labs Lab 07/18/16 1511 07/21/16 1800  GLUCAP 195* 196*       Signed:  THOMPSON,DANIEL MD.   [facility] 07/24/2016, 5:30 PM

## 2016-08-02 ENCOUNTER — Emergency Department (HOSPITAL_COMMUNITY): Payer: Medicaid Other

## 2016-08-02 ENCOUNTER — Encounter (HOSPITAL_COMMUNITY): Payer: Self-pay | Admitting: Emergency Medicine

## 2016-08-02 ENCOUNTER — Emergency Department (HOSPITAL_COMMUNITY)
Admission: EM | Admit: 2016-08-02 | Discharge: 2016-08-06 | Disposition: A | Discharge status: Home / Self Care | Payer: Medicaid Other | Attending: Emergency Medicine | Admitting: Emergency Medicine

## 2016-08-02 DIAGNOSIS — J449 Chronic obstructive pulmonary disease, unspecified: Secondary | ICD-10-CM | POA: Diagnosis not present

## 2016-08-02 DIAGNOSIS — R440 Auditory hallucinations: Secondary | ICD-10-CM | POA: Insufficient documentation

## 2016-08-02 DIAGNOSIS — R45850 Homicidal ideations: Secondary | ICD-10-CM | POA: Diagnosis not present

## 2016-08-02 DIAGNOSIS — M87051 Idiopathic aseptic necrosis of right femur: Secondary | ICD-10-CM

## 2016-08-02 DIAGNOSIS — I110 Hypertensive heart disease with heart failure: Secondary | ICD-10-CM | POA: Diagnosis not present

## 2016-08-02 DIAGNOSIS — Z79899 Other long term (current) drug therapy: Secondary | ICD-10-CM | POA: Insufficient documentation

## 2016-08-02 DIAGNOSIS — R45851 Suicidal ideations: Secondary | ICD-10-CM

## 2016-08-02 DIAGNOSIS — M87851 Other osteonecrosis, right femur: Secondary | ICD-10-CM | POA: Insufficient documentation

## 2016-08-02 DIAGNOSIS — I509 Heart failure, unspecified: Secondary | ICD-10-CM | POA: Diagnosis not present

## 2016-08-02 DIAGNOSIS — F1910 Other psychoactive substance abuse, uncomplicated: Secondary | ICD-10-CM | POA: Diagnosis present

## 2016-08-02 DIAGNOSIS — E119 Type 2 diabetes mellitus without complications: Secondary | ICD-10-CM | POA: Diagnosis not present

## 2016-08-02 DIAGNOSIS — Z87891 Personal history of nicotine dependence: Secondary | ICD-10-CM | POA: Insufficient documentation

## 2016-08-02 DIAGNOSIS — R0602 Shortness of breath: Secondary | ICD-10-CM | POA: Diagnosis present

## 2016-08-02 LAB — COMPREHENSIVE METABOLIC PANEL
ALBUMIN: 3.7 g/dL (ref 3.5–5.0)
ALT: 18 U/L (ref 17–63)
AST: 21 U/L (ref 15–41)
Alkaline Phosphatase: 54 U/L (ref 38–126)
Anion gap: 10 (ref 5–15)
BUN: 17 mg/dL (ref 6–20)
CHLORIDE: 114 mmol/L — AB (ref 101–111)
CO2: 18 mmol/L — AB (ref 22–32)
CREATININE: 1.1 mg/dL (ref 0.61–1.24)
Calcium: 9.2 mg/dL (ref 8.9–10.3)
GFR calc Af Amer: >60 mL/min (ref >60)
GFR calc non Af Amer: >60 mL/min (ref >60)
GLUCOSE: 87 mg/dL (ref 65–99)
Potassium: 3.8 mmol/L (ref 3.5–5.1)
SODIUM: 142 mmol/L (ref 135–145)
Total Bilirubin: 0.5 mg/dL (ref 0.3–1.2)
Total Protein: 6.9 g/dL (ref 6.5–8.1)

## 2016-08-02 LAB — CBC
HEMATOCRIT: 39.8 % (ref 39.0–52.0)
HEMOGLOBIN: 12.8 g/dL — AB (ref 13.0–17.0)
MCH: 28.3 pg (ref 26.0–34.0)
MCHC: 32.2 g/dL (ref 30.0–36.0)
MCV: 87.9 fL (ref 78.0–100.0)
Platelets: 205 10*3/uL (ref 150–400)
RBC: 4.53 MIL/uL (ref 4.22–5.81)
RDW: 16.4 % — ABNORMAL HIGH (ref 11.5–15.5)
WBC: 4.4 10*3/uL (ref 4.0–10.5)

## 2016-08-02 LAB — RAPID URINE DRUG SCREEN, HOSP PERFORMED
AMPHETAMINES: NOT DETECTED
Barbiturates: NOT DETECTED
Benzodiazepines: NOT DETECTED
Cocaine: POSITIVE — AB
OPIATES: NOT DETECTED
TETRAHYDROCANNABINOL: NOT DETECTED

## 2016-08-02 LAB — I-STAT TROPONIN, ED: Troponin i, poc: 0 ng/mL (ref 0.00–0.08)

## 2016-08-02 LAB — ACETAMINOPHEN LEVEL: Acetaminophen (Tylenol), Serum: <10 ug/mL — ABNORMAL LOW (ref 10–30)

## 2016-08-02 LAB — SALICYLATE LEVEL

## 2016-08-02 LAB — ETHANOL: Alcohol, Ethyl (B): <5 mg/dL (ref <5)

## 2016-08-02 MED ORDER — AMLODIPINE BESYLATE 5 MG PO TABS
5.0000 mg | ORAL_TABLET | Freq: Every day | ORAL | Status: DC
  Administered 2016-08-03 – 2016-08-06 (×3): 5 mg via ORAL
  Filled 2016-08-02 (×5): qty 1

## 2016-08-02 MED ORDER — THEOPHYLLINE ER 300 MG PO CP24
300.0000 mg | ORAL_CAPSULE | Freq: Every day | ORAL | Status: DC
  Administered 2016-08-03: 300 mg via ORAL
  Filled 2016-08-02 (×2): qty 1

## 2016-08-02 MED ORDER — SUCRALFATE 1 G PO TABS
1.0000 g | ORAL_TABLET | Freq: Three times a day (TID) | ORAL | Status: DC
  Administered 2016-08-03 – 2016-08-06 (×11): 1 g via ORAL
  Filled 2016-08-02 (×11): qty 1

## 2016-08-02 MED ORDER — TIOTROPIUM BROMIDE MONOHYDRATE 18 MCG IN CAPS
18.0000 ug | ORAL_CAPSULE | Freq: Every day | RESPIRATORY_TRACT | Status: DC
  Administered 2016-08-03 – 2016-08-06 (×4): 18 ug via RESPIRATORY_TRACT
  Filled 2016-08-02 (×3): qty 5

## 2016-08-02 MED ORDER — FOLIC ACID 1 MG PO TABS
1.0000 mg | ORAL_TABLET | Freq: Every day | ORAL | Status: DC
  Administered 2016-08-03 – 2016-08-06 (×4): 1 mg via ORAL
  Filled 2016-08-02 (×4): qty 1

## 2016-08-02 MED ORDER — PREDNISONE 20 MG PO TABS
60.0000 mg | ORAL_TABLET | Freq: Once | ORAL | Status: AC
Start: 2016-08-02 — End: 2016-08-02
  Administered 2016-08-02: 60 mg via ORAL
  Filled 2016-08-02: qty 3

## 2016-08-02 MED ORDER — FLUPHENAZINE HCL 10 MG PO TABS: 10.0000 mg | ORAL_TABLET | ORAL | Status: DC

## 2016-08-02 MED ORDER — DULOXETINE HCL 30 MG PO CPEP
30.0000 mg | ORAL_CAPSULE | Freq: Every day | ORAL | Status: DC
  Administered 2016-08-03 – 2016-08-06 (×4): 30 mg via ORAL
  Filled 2016-08-02 (×4): qty 1

## 2016-08-02 MED ORDER — QUETIAPINE FUMARATE 100 MG PO TABS
100.0000 mg | ORAL_TABLET | Freq: Every day | ORAL | Status: DC
  Administered 2016-08-03 – 2016-08-04 (×2): 100 mg via ORAL
  Filled 2016-08-02: qty 1
  Filled 2016-08-02: qty 4
  Filled 2016-08-02: qty 1
  Filled 2016-08-02: qty 4
  Filled 2016-08-02 (×2): qty 1

## 2016-08-02 MED ORDER — ALBUTEROL (5 MG/ML) CONTINUOUS INHALATION SOLN
15.0000 mg/h | INHALATION_SOLUTION | Freq: Once | RESPIRATORY_TRACT | Status: AC
  Administered 2016-08-02: 15 mg/h via RESPIRATORY_TRACT
  Filled 2016-08-02: qty 20

## 2016-08-02 MED ORDER — PREDNISONE 20 MG PO TABS
20.0000 mg | ORAL_TABLET | Freq: Two times a day (BID) | ORAL | Status: DC
  Administered 2016-08-03 – 2016-08-06 (×8): 20 mg via ORAL
  Filled 2016-08-02 (×8): qty 1

## 2016-08-02 MED ORDER — BENZTROPINE MESYLATE 1 MG PO TABS
1.0000 mg | ORAL_TABLET | Freq: Two times a day (BID) | ORAL | Status: DC
Start: 2016-08-03 — End: 2016-08-06
  Administered 2016-08-03 – 2016-08-06 (×8): 1 mg via ORAL
  Filled 2016-08-02 (×8): qty 1

## 2016-08-02 MED ORDER — MOMETASONE FURO-FORMOTEROL FUM 200-5 MCG/ACT IN AERO
1.0000 | INHALATION_SPRAY | Freq: Two times a day (BID) | RESPIRATORY_TRACT | Status: DC
Start: 2016-08-03 — End: 2016-08-06
  Administered 2016-08-03 – 2016-08-06 (×6): 1 via RESPIRATORY_TRACT
  Filled 2016-08-02 (×2): qty 8.8

## 2016-08-02 MED ORDER — ZOLPIDEM TARTRATE 5 MG PO TABS
5.0000 mg | ORAL_TABLET | Freq: Every evening | ORAL | Status: DC | PRN
  Administered 2016-08-03: 5 mg via ORAL
  Filled 2016-08-02: qty 1

## 2016-08-02 MED ORDER — VITAMIN B-1 100 MG PO TABS
100.0000 mg | ORAL_TABLET | Freq: Every day | ORAL | Status: DC
  Administered 2016-08-03 – 2016-08-06 (×4): 100 mg via ORAL
  Filled 2016-08-02 (×4): qty 1

## 2016-08-02 MED ORDER — ACETAMINOPHEN 325 MG PO TABS
650.0000 mg | ORAL_TABLET | ORAL | Status: DC | PRN
  Administered 2016-08-05: 650 mg via ORAL
  Filled 2016-08-02: qty 2

## 2016-08-02 MED ORDER — VITAMIN D 1000 UNITS PO TABS
1000.0000 [IU] | ORAL_TABLET | Freq: Every day | ORAL | Status: DC
  Administered 2016-08-03 – 2016-08-06 (×4): 1000 [IU] via ORAL
  Filled 2016-08-02 (×4): qty 1

## 2016-08-02 MED ORDER — ALBUTEROL SULFATE (2.5 MG/3ML) 0.083% IN NEBU
5.0000 mg | INHALATION_SOLUTION | Freq: Once | RESPIRATORY_TRACT | Status: AC
  Administered 2016-08-02: 5 mg via RESPIRATORY_TRACT

## 2016-08-02 MED ORDER — KETOROLAC TROMETHAMINE 60 MG/2ML IM SOLN
60.0000 mg | Freq: Once | INTRAMUSCULAR | Status: DC
  Filled 2016-08-02: qty 2

## 2016-08-02 MED ORDER — ALBUTEROL SULFATE (2.5 MG/3ML) 0.083% IN NEBU: 2.5000 mg | INHALATION_SOLUTION | RESPIRATORY_TRACT | Status: DC | PRN

## 2016-08-02 MED ORDER — NICOTINE 21 MG/24HR TD PT24: 21.0000 mg | MEDICATED_PATCH | Freq: Every day | TRANSDERMAL | Status: DC | PRN

## 2016-08-02 MED ORDER — PANTOPRAZOLE SODIUM 40 MG PO TBEC
40.0000 mg | DELAYED_RELEASE_TABLET | Freq: Two times a day (BID) | ORAL | Status: DC
  Administered 2016-08-03 – 2016-08-06 (×8): 40 mg via ORAL
  Filled 2016-08-02 (×8): qty 1

## 2016-08-02 MED ORDER — ALUM & MAG HYDROXIDE-SIMETH 200-200-20 MG/5ML PO SUSP: 30.0000 mL | ORAL | Status: DC | PRN

## 2016-08-02 MED ORDER — ALBUTEROL SULFATE (2.5 MG/3ML) 0.083% IN NEBU
INHALATION_SOLUTION | RESPIRATORY_TRACT | Status: AC
  Filled 2016-08-02: qty 6

## 2016-08-02 MED ORDER — ONDANSETRON HCL 4 MG PO TABS: 4.0000 mg | ORAL_TABLET | Freq: Three times a day (TID) | ORAL | Status: DC | PRN

## 2016-08-02 MED ORDER — ISOSORBIDE MONONITRATE ER 30 MG PO TB24
30.0000 mg | ORAL_TABLET | Freq: Every day | ORAL | Status: DC
  Administered 2016-08-03 – 2016-08-06 (×4): 30 mg via ORAL
  Filled 2016-08-02 (×4): qty 1

## 2016-08-02 MED ORDER — ALBUTEROL SULFATE (2.5 MG/3ML) 0.083% IN NEBU
INHALATION_SOLUTION | RESPIRATORY_TRACT | Status: AC
  Filled 2016-08-02: qty 18

## 2016-08-02 MED ORDER — ATORVASTATIN CALCIUM 20 MG PO TABS
20.0000 mg | ORAL_TABLET | Freq: Every day | ORAL | Status: DC
  Administered 2016-08-03 – 2016-08-06 (×4): 20 mg via ORAL
  Filled 2016-08-02 (×3): qty 2
  Filled 2016-08-02 (×2): qty 1
  Filled 2016-08-02: qty 2
  Filled 2016-08-02 (×2): qty 1

## 2016-08-02 NOTE — ED Notes (Signed)
Patient transported to X-ray 

## 2016-08-02 NOTE — ED Notes (Signed)
Called staffing for sitter 

## 2016-08-02 NOTE — Discharge Instructions (Addendum)
Please continue to follow-up with your outpatient psychiatric services.  Return for worsening symptoms, including difficulty breathing, passing out, feeling unsafe, or any other symptoms concerning to you.

## 2016-08-02 NOTE — ED Triage Notes (Signed)
Pt presents to ED for assessment of worsening SOB x 2.  Hx of asthma and cOPD.  Wheezes heard at triage.  PT has done two home nebulizations without relief.  Pt unable to speak in full sentences.  Pt also states he is hearing voices.  States "i think I'm taking my medicines".  Pt c/o SI and HI.

## 2016-08-02 NOTE — ED Provider Notes (Signed)
[facility]-EMER[facility]ENCY DEPT Provider Note   CSN: [id number] Arrival date & time: 08/02/16  1648     History   Chief Complaint Chief Complaint  Patient presents with  . Shortness of Breath  . Hallucinations    HPI Francisco Beard is a 56 y.o. male.  He presents for evaluation of homicidal ideation, suicidal ideation, and shortness of breath.  He states he started using crack 2 days ago, relapsing because he was feeling sad.  He is sad because he states his brother killed his mother last week and now he wants to kill his brother.  He has suicidal ideation, without a plan.  He states that he is taking his usual psychiatric medications, but ran out of his pain medicines 3 days ago.  He reports hearing voices that tell him to kill his brother, for 2 days.  He has had auditory hallucinations in the past, but none recently.  He was seen today by ACT team counselor, who brought him here because of the psychiatric condition.  He denies fever or chest pain.  He has cough productive of green sputum.  He had a recent psychiatric admission, 2 months ago.  There are no other known modifying factors.  HPI  Past Medical History:  Diagnosis Date  . Anxiety   . Arthritis    "hips; bottom part of my back" (07/09/2016)  . Asthmatic bronchitis   . Bipolar disorder (HCC)   . CHF (congestive heart failure) (HCC)   . Chronic bronchitis (HCC)   . Chronic hip pain   . COPD (chronic obstructive pulmonary disease) (HCC)   . Depression   . GERD (gastroesophageal reflux disease)   . High cholesterol   . History of hiatal hernia   . Hypertension   . Irregular heart beats   . Pneumonia    "3 times; it's been awhile" (07/09/2016)  . Schizophrenia (HCC)   . Type II diabetes mellitus [facility])     Patient Active Problem List   Diagnosis Date Noted  . Acute exacerbation of COPD with asthma (HCC) 07/09/2016  . GERD (gastroesophageal reflux disease) 07/09/2016  . Polysubstance abuse 07/09/2016  . Homicidal ideation   .  Bipolar disorder (HCC) 04/17/2016  . HTN (hypertension) 04/17/2016  . Chest pain   . COPD exacerbation (HCC) 04/13/2016  . COPD (chronic obstructive pulmonary disease) (HCC) 04/13/2016  . Avascular necrosis of bone of right hip (HCC) 04/08/2016  . COPD with acute exacerbation (HCC) 04/01/2016  . Suicidal ideations 08/17/2013    Past Surgical History:  Procedure Laterality Date  . CLOSED REDUCTION HAND FRACTURE Right    "got 6 screws and a plate in there"  . ELBOW FRACTURE SURGERY Left    "put it through the wall"  . FRACTURE SURGERY         Home Medications    Prior to Admission medications   Medication Sig Start Date End Date Taking? Authorizing Provider  albuterol (PROVENTIL HFA;VENTOLIN HFA) 108 (90 Base) MCG/ACT inhaler Inhale 1-2 puffs into the lungs every 4 (four) hours as needed for wheezing or shortness of breath. 04/17/16   Rodolph Bong, MD  albuterol (PROVENTIL) (2.5 MG/3ML) 0.083% nebulizer solution Take 3 mLs (2.5 mg total) by nebulization every 4 (four) hours as needed for wheezing or shortness of breath. 07/24/16   Rodolph Bong, MD  amLODipine (NORVASC) 5 MG tablet Take 1 tablet (5 mg total) by mouth daily. RESUME IN 3 DAYS. 04/20/16   Rodolph Bong, MD  Ascorbic Acid (  VITAMIN C PO) Take 1 tablet by mouth daily.    Historical Provider, MD  atorvastatin (LIPITOR) 20 MG tablet Take 20 mg by mouth daily.    Historical Provider, MD  benztropine (COGENTIN) 1 MG tablet Take 1 tablet (1 mg total) by mouth 2 (two) times daily. 07/24/16   Rodolph Bong, MD  Cholecalciferol (VITAMIN D3) 1000 units CAPS Take 1 capsule by mouth daily.    Historical Provider, MD  DULoxetine (CYMBALTA) 30 MG capsule Take 30 mg by mouth daily.    Historical Provider, MD  famotidine (PEPCID AC) 10 MG chewable tablet Chew 10 mg by mouth 2 (two) times daily as needed for heartburn.    Historical Provider, MD  ferrous gluconate (FERGON) 324 MG tablet Take 324 mg by mouth daily.     Historical Provider, MD  fluPHENAZine (PROLIXIN) 10 MG tablet Take 10-20 mg by mouth See admin instructions. Pt takes 10mg  in am, 20mg  in pm    Historical Provider, MD  folic acid (FOLVITE) 1 MG tablet Take 1 mg by mouth daily.    Historical Provider, MD  HYDROcodone-acetaminophen (NORCO/VICODIN) 5-325 MG tablet Take 1 tablet by mouth every 6 (six) hours as needed for moderate pain. 04/17/16   Rodolph Bong, MD  isosorbide mononitrate (IMDUR) 30 MG 24 hr tablet Take 1 tablet (30 mg total) by mouth daily. 07/25/16   Rodolph Bong, MD  loratadine (CLARITIN) 10 MG tablet Take 10 mg by mouth daily as needed for allergies.    Historical Provider, MD  mometasone-formoterol (DULERA) 200-5 MCG/ACT AERO Inhale 1 puff into the lungs 2 (two) times daily. 04/04/16   Narda Bonds, MD  pantoprazole (PROTONIX) 40 MG tablet Take 1 tablet (40 mg total) by mouth 2 (two) times daily. 07/24/16   Rodolph Bong, MD  polyethylene glycol [facility] Ethe[facility] Hal) packet Take 17 g by mouth daily. 07/24/16   Rodolph Bong, MD  predniSONE (DELTASONE) 10 MG tablet Take 40mg  daily for 3days,Take 30mg  daily for 3days,Take 20mg  daily for 3days,Take 10mg  daily for 3days, then stop. 07/14/16   Rolly Salter, MD  predniSONE (DELTASONE) 20 MG tablet Take 1 tablet (20 mg total) by mouth daily with breakfast. Take 1 tablet daily x 3 days, then stop. 07/25/16   Rodolph Bong, MD  QUEtiapine (SEROQUEL) 100 MG tablet Take 1 tablet (100 mg total) by mouth at bedtime. 07/24/16   Rodolph Bong, MD  senna-docusate (SENOKOT-S) 8.6-50 MG tablet Take 2 tablets by mouth at bedtime. Patient not taking: Reported on 07/09/2016 04/17/16   Rodolph Bong, MD  sucralfate (CARAFATE) 1 g tablet Take 1 g by mouth 4 (four) times daily -  with meals and at bedtime.    Historical Provider, MD  theophylline (THEO-24) 300 MG 24 hr capsule Take 300 mg by mouth daily.    Historical Provider, MD  thiamine 100 MG tablet Take 1 tablet (100 mg total)  by mouth daily. 07/24/16   Rodolph Bong, MD  tiotropium (SPIRIVA HANDIHALER) 18 MCG inhalation capsule Place 1 capsule (18 mcg total) into inhaler and inhale daily. 04/05/16   Narda Bonds, MD  Vitamin D, Ergocalciferol, (DRISDOL) 50000 units CAPS capsule Take 50,000 Units by mouth once a week.    Historical Provider, MD    Family History History reviewed. No pertinent family history.  Social History Social History  Substance Use Topics  . Smoking status: Former Smoker    Packs/day: 3.00    Years: 41.00  Types: Cigarettes    Quit date: 04/08/2016  . Smokeless tobacco: Never Used  . Alcohol use 12.0 oz/week    20 Standard drinks or equivalent per week     Comment: 07/09/2016 "I had quit for 1 yr, relapsed 3 days ago"     Allergies   Strawberry extract; Tomato; Aleve [naproxen]; Aspirin; Ibuprofen; Penicillins; Seroquel [quetiapine]; and Onion   Review of Systems Review of Systems  All other systems reviewed and are negative.    Physical Exam Updated Vital Signs BP 127/63   Pulse 73   Temp 97.9 F (36.6 C) (Oral)   Resp 24   SpO2 95%   Physical Exam  Constitutional: He is oriented to person, place, and time. He appears well-developed.  Loud raspy respirations.  He appears older than stated age.  HENT:  Head: Normocephalic and atraumatic.  Right Ear: External ear normal.  Left Ear: External ear normal.  Eyes: Conjunctivae and EOM are normal. Pupils are equal, round, and reactive to light.  Neck: Normal range of motion and phonation normal. Neck supple.  Cardiovascular: Normal rate, regular rhythm and normal heart sounds.   Pulmonary/Chest: Effort normal. No respiratory distress. He has no rales. He exhibits no tenderness and no bony tenderness.  Decreased hair growth bilaterally with generalized wheezing.  Abdominal: Soft. There is no tenderness.  Musculoskeletal: Normal range of motion.  Neurological: He is alert and oriented to person, place, and time. No  cranial nerve deficit or sensory deficit. He exhibits normal muscle tone. Coordination normal.  Skin: Skin is warm, dry and intact.  Psychiatric: His behavior is normal. Judgment and thought content normal.  He appears depressed.  He is not responding to internal stimuli  Nursing note and vitals reviewed.    ED Treatments / Results  Labs (all labs ordered are listed, but only abnormal results are displayed) Labs Reviewed  COMPREHENSIVE METABOLIC PANEL - Abnormal; Notable for the following:       Result Value   Chloride 114 (*)    CO2 18 (*)    All other components within normal limits  ACETAMINOPHEN LEVEL - Abnormal; Notable for the following:    Acetaminophen (Tylenol), Serum <10 (*)    All other components within normal limits  CBC - Abnormal; Notable for the following:    Hemoglobin 12.8 (*)    RDW 16.4 (*)    All other components within normal limits  RAPID URINE DRUG SCREEN, HOSP PERFORMED - Abnormal; Notable for the following:    Cocaine POSITIVE (*)    All other components within normal limits  ETHANOL  SALICYLATE LEVEL  I-STAT TROPOININ, ED    EKG  EKG Interpretation  Date/Time:  Friday August 02 2016 17:17:54 EST Ventricular Rate:  73 PR Interval:  144 QRS Duration: 76 QT Interval:  386 QTC Calculation: 425 R Axis:   72 Text Interpretation:  Normal sinus rhythm Normal ECG since last tracing no significant change Confirmed by Effie Shy  MD, Miken Stecher [phone]) on 08/02/2016 10:23:37 PM       Radiology Dg Chest 2 View  Result Date: 08/02/2016 CLINICAL DATA:  56 y/o M; shortness of breath and wheezing for 2 days with cough. EXAM: CHEST  2 VIEW COMPARISON:  07/09/2016 chest radiograph FINDINGS: Stable normal cardiac silhouette. Aortic atherosclerosis with calcification. Emphysema with bullous changes in lung apices. No new focal consolidation. No pleural effusion or pneumothorax. Mild degenerative changes of the thoracic spine. IMPRESSION: Emphysema.  No acute pulmonary  process identified. Electronically Signed  By: Mitzi Hansen M.D.   On: 08/02/2016 22:17   Dg Pelvis 1-2 Views  Result Date: 08/02/2016 CLINICAL DATA:  Bilateral hip pain. EXAM: PELVIS - 1-2 VIEW COMPARISON:  Pelvis radiograph 03/12/2016. Reformats from CT abdomen/ pelvis 04/15/2016 FINDINGS: Chronic avascular necrosis of the right femoral head with large subchondral cyst and advanced osteoarthritis. No significant change in appearance from prior exam. No acute fracture. No evidence of left hip avascular necrosis on radiograph. Pubic symphysis and sacroiliac joint congruent. IMPRESSION: Stable chronic change about the right hip with avascular necrosis of the femoral head, collapse, and advanced degenerative change. No acute abnormality of the left hip. Electronically Signed   By: Rubye Oaks M.D.   On: 08/02/2016 23:16    Procedures Procedures (including critical care time)  Medications Ordered in ED Medications  ketorolac (TORADOL) injection 60 mg (not administered)  albuterol (PROVENTIL) (2.5 MG/3ML) 0.083% nebulizer solution 5 mg ( Nebulization Not Given 08/02/16 1800)  predniSONE (DELTASONE) tablet 60 mg (60 mg Oral Given 08/02/16 2149)  albuterol (PROVENTIL,VENTOLIN) solution continuous neb (15 mg/hr Nebulization Given 08/02/16 2207)     Initial Impression / Assessment and Plan / ED Course  I have reviewed the triage vital signs and the nursing notes.  Pertinent labs & imaging results that were available during my care of the patient were reviewed by me and considered in my medical decision making (see chart for details).  Clinical Course as of Aug 02 2344  Fri Aug 02, 2016  2342 At this time he is medically cleared for treatment by psychiatry services.  He will require ongoing treatment for COPD exacerbation, and chronic right hip pain.  [EW]    Clinical Course User Index [EW] Mancel Bale, MD    Medications  ketorolac (TORADOL) injection 60 mg (not administered)    albuterol (PROVENTIL) (2.5 MG/3ML) 0.083% nebulizer solution 5 mg ( Nebulization Not Given 08/02/16 1800)  predniSONE (DELTASONE) tablet 60 mg (60 mg Oral Given 08/02/16 2149)  albuterol (PROVENTIL,VENTOLIN) solution continuous neb (15 mg/hr Nebulization Given 08/02/16 2207)    Patient Vitals for the past 24 hrs:  BP Temp Temp src Pulse Resp SpO2  08/02/16 2315 127/63 - - 73 - 95 %  08/02/16 2247 106/77 - - 67 24 94 %  08/02/16 2145 106/76 - - 65 - 95 %  08/02/16 2102 105/74 97.9 F (36.6 C) Oral 63 24 97 %  08/02/16 1700 108/74 98.7 F (37.1 C) Oral 74 (!) 30 98 %    11:31 PM Reevaluation with update and discussion. After initial assessment and treatment, an updated evaluation reveals he is breathing more comfortably now.  Is able to walk to the bathroom on his own.  Respiratory rate is improved now.Mancel Bale L    Final Clinical Impressions(s) / ED Diagnoses   Final diagnoses:  Homicidal ideation  Suicidal ideation  Avascular necrosis of bone of right hip (HCC)  Auditory hallucinations    Patient with history of psychiatric illness, and polysubstance abuse, complicated by chronic pain secondary to right hip avascular necrosis.  He states he is living in a rooming house at this time.  He gets care from [facility] physicians, for his hip and pulmonary problems.  He presented with shortness of breath and signs and symptoms of COPD exacerbation.  Vital signs are stable.  Patient is improved with symptomatic treatment.  Nursing Notes Reviewed/ Care Coordinated Applicable Imaging Reviewed Interpretation of Laboratory Data incorporated into ED treatment    Plan-as per TTS in  conjunction with oncoming provider team   New Prescriptions New Prescriptions   No medications on file     Mancel BaleElliott Jantz Main, MD 08/04/16 [phone]

## 2016-08-03 MED ORDER — FLUPHENAZINE HCL 10 MG PO TABS
10.0000 mg | ORAL_TABLET | Freq: Every day | ORAL | Status: DC
  Administered 2016-08-03 – 2016-08-06 (×4): 10 mg via ORAL
  Filled 2016-08-03 (×5): qty 1

## 2016-08-03 MED ORDER — FLUPHENAZINE HCL 10 MG PO TABS
20.0000 mg | ORAL_TABLET | Freq: Every day | ORAL | Status: DC
  Administered 2016-08-03 – 2016-08-04 (×2): 20 mg via ORAL
  Filled 2016-08-03 (×4): qty 2
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
## 2016-08-03 NOTE — ED Notes (Addendum)
Pt's dinner tray heated as requested. Pt requested for grilled and baked chicken to be heated. Advised pt this will be the only time as his food was delivered warm but he was talking rather than eating. Voiced understanding.

## 2016-08-03 NOTE — ED Notes (Signed)
Dinner Tray Ordered 

## 2016-08-03 NOTE — ED Notes (Signed)
Service Response aware pt's dinner order request change - change vegetables to mashed potatoes and mac'n cheese, apple, sugar cookie, grape juice - keep chicken entree same - double portions and aware of pt's allergies.

## 2016-08-03 NOTE — ED Notes (Signed)
Offered pt snack - declined - d/t has sandwich at bedside. Pt given copy of Medical Clearance Pt Policy form - pt verbalized understanding.

## 2016-08-03 NOTE — ED Notes (Signed)
Snacks given 

## 2016-08-03 NOTE — ED Notes (Signed)
Pt asking to use portable phone so he may call in another breakfast order. States "I know how this works. I have been here 153 times and I can order my own food. I need a lot of food because I take Prednisone. Now bring me the phone". Advised pt of Medical Clearance Pt Policies and that this RN will call and order him an extra tray. Pt calmed and voiced appreciation.

## 2016-08-03 NOTE — ED Notes (Signed)
Pt bathed in bathroom in Pod F rather than showering today.

## 2016-08-03 NOTE — ED Notes (Signed)
TTS in room speaking with patient.  

## 2016-08-03 NOTE — ED Notes (Signed)
Pt upset d/t received grilled chicken which he states will upset his reflux rather than seasoned, baked chicken. Pt also given tray w/baked chicken.

## 2016-08-03 NOTE — ED Notes (Signed)
Lunch order placed

## 2016-08-03 NOTE — Progress Notes (Signed)
Patient was recommended inpatient treatment and referred at the following facilities:  [facility] - per Greggory StallionQween, male and male adult beds open today, fax referral. Referral faxed. [facility] - per intake Sunny SchleinFelicia, reviewing referrals today.  CSW in disposition will continue to seek placement.  [place] Abtsatia Kyvon Hu, LCSWA Disposition staff 08/03/2016 10:04 AM

## 2016-08-04 MED ORDER — DOCUSATE SODIUM 100 MG PO CAPS
100.0000 mg | ORAL_CAPSULE | Freq: Once | ORAL | Status: AC
  Administered 2016-08-04: 100 mg via ORAL
  Filled 2016-08-04: qty 1

## 2016-08-04 MED ORDER — THEOPHYLLINE ER 100 MG PO TB12
100.0000 mg | ORAL_TABLET | Freq: Three times a day (TID) | ORAL | Status: DC
  Filled 2016-08-04: qty 1

## 2016-08-04 MED ORDER — THEOPHYLLINE ER 300 MG PO TB12
300.0000 mg | ORAL_TABLET | Freq: Every day | ORAL | Status: DC
  Administered 2016-08-04 – 2016-08-06 (×3): 300 mg via ORAL
  Filled 2016-08-04 (×3): qty 1

## 2016-08-04 MED ORDER — MAGNESIUM HYDROXIDE 400 MG/5ML PO SUSP: 15.0000 mL | Freq: Every day | ORAL | Status: DC | PRN

## 2016-08-04 NOTE — ED Notes (Signed)
TTS being performed.  

## 2016-08-04 NOTE — ED Notes (Signed)
Breakfast tray ordered 

## 2016-08-04 NOTE — ED Notes (Signed)
Coke and cookies given for snack as requested - Lunch order received.

## 2016-08-04 NOTE — Progress Notes (Signed)
Referral has been followed up at:  [facility] - per Greggory StallionQween, referral received and requesting doctor's and nurses' notes, and medications. Inquiring if pt will be voluntary. All requested clinicals along with statement that pt would go voluntarily if admitted, per nurse Becky [facility]-ED RN, [facility].  [facility] - left voicemail with call back number. [facility] advised to fax referral. Referral refaxed.  [facility] - per Donzetta SprungAmbica, if doctor accepted patient you would get a call.   [facility] - per Delorise ShinerGrace, send us the referral.  [facility] - per intake, fax referral for review.  [facility]: [facility], [facility], [facility].  CSW will continue to seek placement.  [place] Francisco Beard, LCSWA Disposition staff 08/04/2016 2:06 PM

## 2016-08-04 NOTE — ED Notes (Signed)
Cookies and Coke given as requested for snack. Dinner order received.
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
## 2016-08-04 NOTE — ED Notes (Signed)
Ambulatory to shower w/Sitter.  
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
## 2016-08-05 NOTE — ED Notes (Signed)
Regular Diet was ordered for Lunch. 
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
## 2016-08-05 NOTE — ED Notes (Signed)
Regular Diet has been ordered for Dinner. 

## 2016-08-05 NOTE — ED Notes (Signed)
Patient was given a snack and water

## 2016-08-05 NOTE — Progress Notes (Signed)
Reviewed pt's chart- patient was assessed in ED by TTS 3/2 and inpatient treatment recommended. Per chart pt has been referred to psychiatric inpatient programs. Noted per assessment pt received ACTT services.  Called [facility] ACTT [phone] and spoke with Blima LedgerAkeria. She states ACTT member Jonny RuizJohn was present with pt at ED admission and advises speak with him for more information. CSW left voicemail for Jens SomJohn Brown at [phone]. Will inquire as to events leading to ED admission and attempt gathering information to assist psychiatry team in determining need to continue seeking inpatient treatment or if pt closer to baseline functioning at this point.  Pt will be re-evaluated via telepsychiatry for continued monitoring and recommendations today as well.  Ilean SkillMeghan Leonela Kivi, MSW, LCSW Clinical Social Work, Disposition  08/05/2016 [phone]

## 2016-08-06 NOTE — ED Notes (Signed)
Patient calling out asking for razor to shave.  Explained we don't not have razors at our facility for patients.
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
## 2016-08-06 NOTE — ED Notes (Signed)
Pt asking EDP Liu for rx for Percocet - pt advised he will need to obtain from his PCP - voiced understanding.

## 2016-08-06 NOTE — ED Notes (Signed)
Francisco Beard - [facility] - advised their team is out on a "crisis" at this time and is requesting for the hospital to provide pt w/taxi voucher. Jody, SW, aware.

## 2016-08-06 NOTE — ED Notes (Signed)
Jody, SW, advised will obtain taxi voucher.

## 2016-08-06 NOTE — Progress Notes (Signed)
[facility].  Ilean SkillMeghan Marta Bouie, MSW, LCSW Clinical Social Work, Disposition  08/06/2016 [phone]

## 2016-08-06 NOTE — ED Provider Notes (Signed)
Re-evaluated by TTS today. He is feeling better. No SI/HI/AVH. Felt to be stable for discharge. Not requiring inpatient hospitalization. Patient without complaints. Vital signs stable. ED course reviewed. No further medical issues, no dyspnea. Plan to discharge with his ACT team.   Lavera Guiseana Duo Liu, MD 08/06/16 [phone]

## 2016-08-06 NOTE — ED Notes (Signed)
Pt asked for his cell phone so can look up number to [facility] to ask them to pick him up, then asked staff to call. Then pt ambulatory to nurses' desk and calling himself. They advised will call him back.

## 2016-08-06 NOTE — ED Notes (Signed)
[facility] called for pt - cab voucher given.

## 2016-08-06 NOTE — ED Notes (Signed)
Left message for Fannie KneeSue - [facility] - [phone] - to call back in reference for pt needing ride from ED.

## 2016-08-06 NOTE — Progress Notes (Addendum)
Referrals for inpatient treatment sent to: [facility]- per Pricilla Handleroni [facility]- per Kathreen Cosierarlene [facility]- per Thayer Ohmhris  Declined: [facility]- adult medicaid not accepted as payor source [place]- current substance use exclusionary for geriatric unit  Ilean SkillMeghan Saavi Mceachron, MSW, LCSW Clinical Social Work, Disposition  08/06/2016 [phone]

## 2016-08-06 NOTE — ED Notes (Signed)
Aware of delay w/SW and taxi voucher.

## 2016-08-06 NOTE — ED Notes (Signed)
Pt aware hospital providing taxi home this time only. States lives at [address] [place], [place]

## 2016-08-06 NOTE — ED Notes (Signed)
2nd breakfast tray ordered as requested by pt.

## 2016-08-06 NOTE — ED Notes (Signed)
Patient finished lunch and ambulating to restroom without difficulty.

## 2016-08-30 ENCOUNTER — Emergency Department (HOSPITAL_COMMUNITY): Payer: Medicaid Other

## 2016-08-30 ENCOUNTER — Emergency Department (HOSPITAL_COMMUNITY)
Admission: EM | Admit: 2016-08-30 | Discharge: 2016-08-31 | Disposition: A | Discharge status: Home / Self Care | Payer: Medicaid Other | Attending: Emergency Medicine | Admitting: Emergency Medicine

## 2016-08-30 ENCOUNTER — Encounter (HOSPITAL_COMMUNITY): Payer: Self-pay

## 2016-08-30 DIAGNOSIS — R45851 Suicidal ideations: Secondary | ICD-10-CM | POA: Diagnosis not present

## 2016-08-30 DIAGNOSIS — Z79899 Other long term (current) drug therapy: Secondary | ICD-10-CM | POA: Diagnosis not present

## 2016-08-30 DIAGNOSIS — I110 Hypertensive heart disease with heart failure: Secondary | ICD-10-CM | POA: Diagnosis not present

## 2016-08-30 DIAGNOSIS — F1410 Cocaine abuse, uncomplicated: Secondary | ICD-10-CM

## 2016-08-30 DIAGNOSIS — E119 Type 2 diabetes mellitus without complications: Secondary | ICD-10-CM | POA: Insufficient documentation

## 2016-08-30 DIAGNOSIS — R45850 Homicidal ideations: Secondary | ICD-10-CM | POA: Insufficient documentation

## 2016-08-30 DIAGNOSIS — J441 Chronic obstructive pulmonary disease with (acute) exacerbation: Secondary | ICD-10-CM | POA: Insufficient documentation

## 2016-08-30 DIAGNOSIS — I509 Heart failure, unspecified: Secondary | ICD-10-CM | POA: Insufficient documentation

## 2016-08-30 DIAGNOSIS — R079 Chest pain, unspecified: Secondary | ICD-10-CM | POA: Diagnosis present

## 2016-08-30 DIAGNOSIS — R072 Precordial pain: Secondary | ICD-10-CM

## 2016-08-30 LAB — COMPREHENSIVE METABOLIC PANEL
ALBUMIN: 4 g/dL (ref 3.5–5.0)
ALT: 19 U/L (ref 17–63)
AST: 45 U/L — AB (ref 15–41)
Alkaline Phosphatase: 63 U/L (ref 38–126)
Anion gap: 9 (ref 5–15)
BUN: 9 mg/dL (ref 6–20)
CHLORIDE: 109 mmol/L (ref 101–111)
CO2: 21 mmol/L — ABNORMAL LOW (ref 22–32)
Calcium: 9.6 mg/dL (ref 8.9–10.3)
Creatinine, Ser: 1.07 mg/dL (ref 0.61–1.24)
GFR calc Af Amer: >60 mL/min (ref >60)
GFR calc non Af Amer: >60 mL/min (ref >60)
GLUCOSE: 84 mg/dL (ref 65–99)
POTASSIUM: 4.8 mmol/L (ref 3.5–5.1)
Sodium: 139 mmol/L (ref 135–145)
Total Bilirubin: 1.9 mg/dL — ABNORMAL HIGH (ref 0.3–1.2)
Total Protein: 7.2 g/dL (ref 6.5–8.1)

## 2016-08-30 LAB — CBC
HEMATOCRIT: 39.7 % (ref 39.0–52.0)
Hemoglobin: 13.1 g/dL (ref 13.0–17.0)
MCH: 28.4 pg (ref 26.0–34.0)
MCHC: 33 g/dL (ref 30.0–36.0)
MCV: 85.9 fL (ref 78.0–100.0)
PLATELETS: 254 10*3/uL (ref 150–400)
RBC: 4.62 MIL/uL (ref 4.22–5.81)
RDW: 15.7 % — AB (ref 11.5–15.5)
WBC: 5.7 10*3/uL (ref 4.0–10.5)

## 2016-08-30 LAB — I-STAT TROPONIN, ED: Troponin i, poc: 0 ng/mL (ref 0.00–0.08)

## 2016-08-30 LAB — ETHANOL: Alcohol, Ethyl (B): <5 mg/dL (ref <5)

## 2016-08-30 MED ORDER — ONDANSETRON HCL 4 MG PO TABS
4.0000 mg | ORAL_TABLET | Freq: Three times a day (TID) | ORAL | Status: DC | PRN
Start: 2016-08-30 — End: 2016-08-31

## 2016-08-30 MED ORDER — ALBUTEROL (5 MG/ML) CONTINUOUS INHALATION SOLN
10.0000 mg/h | INHALATION_SOLUTION | Freq: Once | RESPIRATORY_TRACT | Status: AC
  Administered 2016-08-30: 10 mg/h via RESPIRATORY_TRACT
  Filled 2016-08-30: qty 20

## 2016-08-30 MED ORDER — ALUM & MAG HYDROXIDE-SIMETH 200-200-20 MG/5ML PO SUSP: 30.0000 mL | ORAL | Status: DC | PRN

## 2016-08-30 MED ORDER — NICOTINE 21 MG/24HR TD PT24
21.0000 mg | MEDICATED_PATCH | Freq: Every day | TRANSDERMAL | Status: DC
Start: 2016-08-30 — End: 2016-08-31
  Filled 2016-08-30: qty 1

## 2016-08-30 MED ORDER — ALBUTEROL SULFATE HFA 108 (90 BASE) MCG/ACT IN AERS: 1.0000 | INHALATION_SPRAY | RESPIRATORY_TRACT | Status: DC | PRN

## 2016-08-30 MED ORDER — THEOPHYLLINE ER 300 MG PO CP24
300.0000 mg | ORAL_CAPSULE | Freq: Every day | ORAL | Status: DC
  Filled 2016-08-30 (×2): qty 1

## 2016-08-30 MED ORDER — FLUPHENAZINE HCL 10 MG PO TABS
10.0000 mg | ORAL_TABLET | Freq: Every day | ORAL | Status: DC
  Administered 2016-08-31: 10 mg via ORAL
  Filled 2016-08-30: qty 1

## 2016-08-30 MED ORDER — ZOLPIDEM TARTRATE 5 MG PO TABS: 5.0000 mg | ORAL_TABLET | Freq: Every evening | ORAL | Status: DC | PRN

## 2016-08-30 MED ORDER — DULOXETINE HCL 30 MG PO CPEP
30.0000 mg | ORAL_CAPSULE | Freq: Every day | ORAL | Status: DC
  Administered 2016-08-30 – 2016-08-31 (×2): 30 mg via ORAL
  Filled 2016-08-30 (×2): qty 1

## 2016-08-30 MED ORDER — ATORVASTATIN CALCIUM 20 MG PO TABS
20.0000 mg | ORAL_TABLET | Freq: Every day | ORAL | Status: DC
  Administered 2016-08-30 – 2016-08-31 (×2): 20 mg via ORAL
  Filled 2016-08-30 (×2): qty 1
  Filled 2016-08-30: qty 2

## 2016-08-30 MED ORDER — BENZTROPINE MESYLATE 1 MG PO TABS
1.0000 mg | ORAL_TABLET | Freq: Two times a day (BID) | ORAL | Status: DC
  Administered 2016-08-30 – 2016-08-31 (×2): 1 mg via ORAL
  Filled 2016-08-30 (×2): qty 1

## 2016-08-30 MED ORDER — ACETAMINOPHEN 325 MG PO TABS: 650.0000 mg | ORAL_TABLET | ORAL | Status: DC | PRN

## 2016-08-30 MED ORDER — IPRATROPIUM-ALBUTEROL 0.5-2.5 (3) MG/3ML IN SOLN
3.0000 mL | Freq: Once | RESPIRATORY_TRACT | Status: AC
  Administered 2016-08-30: 3 mL via RESPIRATORY_TRACT
  Filled 2016-08-30: qty 3

## 2016-08-30 MED ORDER — PREDNISONE 20 MG PO TABS
40.0000 mg | ORAL_TABLET | Freq: Once | ORAL | Status: AC
  Administered 2016-08-30: 40 mg via ORAL
  Filled 2016-08-30: qty 2

## 2016-08-30 MED ORDER — RISPERIDONE 1 MG PO TABS
2.0000 mg | ORAL_TABLET | Freq: Every day | ORAL | Status: DC
  Administered 2016-08-30 – 2016-08-31 (×2): 2 mg via ORAL
  Filled 2016-08-30: qty 1
  Filled 2016-08-30: qty 2

## 2016-08-30 MED ORDER — LORAZEPAM 1 MG PO TABS: 1.0000 mg | ORAL_TABLET | Freq: Three times a day (TID) | ORAL | Status: DC | PRN

## 2016-08-30 MED ORDER — IPRATROPIUM BROMIDE 0.02 % IN SOLN
0.5000 mg | Freq: Once | RESPIRATORY_TRACT | Status: AC
  Administered 2016-08-30: 0.5 mg via RESPIRATORY_TRACT
  Filled 2016-08-30: qty 2.5

## 2016-08-30 MED ORDER — QUETIAPINE FUMARATE 100 MG PO TABS
100.0000 mg | ORAL_TABLET | Freq: Every day | ORAL | Status: DC
  Administered 2016-08-30: 100 mg via ORAL
  Filled 2016-08-30: qty 1

## 2016-08-30 MED ORDER — AMLODIPINE BESYLATE 5 MG PO TABS
5.0000 mg | ORAL_TABLET | Freq: Every day | ORAL | Status: DC
  Administered 2016-08-30 – 2016-08-31 (×2): 5 mg via ORAL
  Filled 2016-08-30 (×2): qty 1

## 2016-08-30 MED ORDER — FLUPHENAZINE HCL 10 MG PO TABS
20.0000 mg | ORAL_TABLET | Freq: Every day | ORAL | Status: DC
  Administered 2016-08-30: 20 mg via ORAL
  Filled 2016-08-30: qty 2

## 2016-08-30 NOTE — ED Notes (Signed)
TTS in progress 

## 2016-08-30 NOTE — ED Provider Notes (Signed)
[facility]-EMERGENCY DEPT Provider Note   CSN: [id number] Arrival date & time: 08/30/16  1035     History   Chief Complaint Chief Complaint  Patient presents with  . Shortness of Breath  . Chest Pain    HPI Francisco Beard is a 56 y.o. male.  HPI   56 year old male with history of cocaine abuse, schizophrenia, diabetes, bipolar, CHF, COPD, GERD brought here via EMS for evaluation of chest pain shortness of breath. Patient reports 5 days ago his brother killed his mom, and 3 days ago he had a stillborn child. States that the increase stress caused him to begin binging on cocaine. States that he has been using cocaine exclusively for the past 4 days and have not been sleeping. He is having suicidal thoughts, trying to kill himself with cocaine. He also have homicidal thoughts of shooting his brother. He denies auditory or visual sedation. Denies any recent alcohol use. Patient states he has been clean for the past 1 year and he is here requesting for help. He did endorse having chest tightness and pain since last night which has been ongoing. Also endorsed wheezing and shortness of breath which he attributed to his COPD. States he ran out of his inhaler. He is requesting for something to eat.  Past Medical History:  Diagnosis Date  . Anxiety   . Arthritis    "hips; bottom part of my back" (07/09/2016)  . Asthmatic bronchitis   . Bipolar disorder (HCC)   . CHF (congestive heart failure) (HCC)   . Chronic bronchitis (HCC)   . Chronic hip pain   . COPD (chronic obstructive pulmonary disease) (HCC)   . Depression   . GERD (gastroesophageal reflux disease)   . High cholesterol   . History of hiatal hernia   . Hypertension   . Irregular heart beats   . Pneumonia    "3 times; it's been awhile" (07/09/2016)  . Schizophrenia (HCC)   . Type II diabetes mellitus [facility])     Patient Active Problem List   Diagnosis Date Noted  . Acute exacerbation of COPD with asthma (HCC) 07/09/2016  . GERD  (gastroesophageal reflux disease) 07/09/2016  . Polysubstance abuse 07/09/2016  . Homicidal ideation   . Bipolar disorder (HCC) 04/17/2016  . HTN (hypertension) 04/17/2016  . Chest pain   . COPD exacerbation (HCC) 04/13/2016  . COPD (chronic obstructive pulmonary disease) (HCC) 04/13/2016  . Avascular necrosis of bone of right hip (HCC) 04/08/2016  . COPD with acute exacerbation (HCC) 04/01/2016  . Suicidal ideations 08/17/2013    Past Surgical History:  Procedure Laterality Date  . CLOSED REDUCTION HAND FRACTURE Right    "got 6 screws and a plate in there"  . ELBOW FRACTURE SURGERY Left    "put it through the wall"  . FRACTURE SURGERY         Home Medications    Prior to Admission medications   Medication Sig Start Date End Date Taking? Authorizing Provider  albuterol (PROVENTIL HFA;VENTOLIN HFA) 108 (90 Base) MCG/ACT inhaler Inhale 1-2 puffs into the lungs every 4 (four) hours as needed for wheezing or shortness of breath. 04/17/16   Rodolph Bong, MD  albuterol (PROVENTIL) (2.5 MG/3ML) 0.083% nebulizer solution Take 3 mLs (2.5 mg total) by nebulization every 4 (four) hours as needed for wheezing or shortness of breath. 07/24/16   Rodolph Bong, MD  amLODipine (NORVASC) 5 MG tablet Take 1 tablet (5 mg total) by mouth daily. RESUME IN 3 DAYS. 04/20/16  Rodolph Bong, MD  Ascorbic Acid (VITAMIN C PO) Take 1 tablet by mouth daily.    Historical Provider, MD  atorvastatin (LIPITOR) 20 MG tablet Take 20 mg by mouth daily.    Historical Provider, MD  benztropine (COGENTIN) 1 MG tablet Take 1 tablet (1 mg total) by mouth 2 (two) times daily. 07/24/16   Rodolph Bong, MD  Cholecalciferol (VITAMIN D3) 1000 units CAPS Take 1 capsule by mouth daily.    Historical Provider, MD  DULoxetine (CYMBALTA) 30 MG capsule Take 30 mg by mouth daily.    Historical Provider, MD  famotidine (PEPCID AC) 10 MG chewable tablet Chew 10 mg by mouth 2 (two) times daily as needed for heartburn.     Historical Provider, MD  ferrous gluconate (FERGON) 324 MG tablet Take 324 mg by mouth daily.    Historical Provider, MD  fluPHENAZine (PROLIXIN) 10 MG tablet Take 10-20 mg by mouth See admin instructions. Pt takes  in am,  in pm    Historical Provider, MD  folic acid (FOLVITE) 1 MG tablet Take 1 mg by mouth daily.    Historical Provider, MD  HYDROcodone-acetaminophen (NORCO/VICODIN) 5-325 MG tablet Take 1 tablet by mouth every 6 (six) hours as needed for moderate pain. 04/17/16   Rodolph Bong, MD  isosorbide mononitrate (IMDUR) 30 MG 24 hr tablet Take 1 tablet (30 mg total) by mouth daily. 07/25/16   Rodolph Bong, MD  loratadine (CLARITIN) 10 MG tablet Take 10 mg by mouth daily as needed for allergies.    Historical Provider, MD  mometasone-formoterol (DULERA) 200-5 MCG/ACT AERO Inhale 1 puff into the lungs 2 (two) times daily. 04/04/16   Narda Bonds, MD  pantoprazole (PROTONIX) 40 MG tablet Take 1 tablet (40 mg total) by mouth 2 (two) times daily. 07/24/16   Rodolph Bong, MD  polyethylene glycol [facility] / Ethelene Hal) packet Take 17 g by mouth daily. 07/24/16   Rodolph Bong, MD  predniSONE (DELTASONE) 10 MG tablet Take  daily for 3days,Take  daily for 3days,Take  daily for 3days,Take  daily for 3days, then stop. 07/14/16   Rolly Salter, MD  predniSONE (DELTASONE) 20 MG tablet Take 1 tablet (20 mg total) by mouth daily with breakfast. Take 1 tablet daily x 3 days, then stop. 07/25/16   Rodolph Bong, MD  QUEtiapine (SEROQUEL) 100 MG tablet Take 1 tablet (100 mg total) by mouth at bedtime. 07/24/16   Rodolph Bong, MD  senna-docusate (SENOKOT-S) 8.6-50 MG tablet Take 2 tablets by mouth at bedtime. Patient not taking: Reported on 07/09/2016 04/17/16   Rodolph Bong, MD  sucralfate (CARAFATE) 1 g tablet Take 1 g by mouth 4 (four) times daily -  with meals and at bedtime.    Historical Provider, MD  theophylline (THEO-24) 300 MG 24 hr capsule Take 300  mg by mouth daily.    Historical Provider, MD  thiamine 100 MG tablet Take 1 tablet (100 mg total) by mouth daily. 07/24/16   Rodolph Bong, MD  tiotropium (SPIRIVA HANDIHALER) 18 MCG inhalation capsule Place 1 capsule (18 mcg total) into inhaler and inhale daily. 04/05/16   Narda Bonds, MD  Vitamin D, Ergocalciferol, (DRISDOL) 50000 units CAPS capsule Take 50,000 Units by mouth once a week.    Historical Provider, MD    Family History History reviewed. No pertinent family history.  Social History Social History  Substance Use Topics  . Smoking status: Heavy Tobacco Smoker  Packs/day: 0.50    Years: 41.00    Types: Cigarettes    Last attempt to quit: 04/08/2016  . Smokeless tobacco: Never Used  . Alcohol use 12.0 oz/week    20 Standard drinks or equivalent per week     Comment: 07/09/2016 "I had quit for 1 yr, relapsed 3 days ago"     Allergies   Strawberry extract; Tomato; Aleve [naproxen]; Aspirin; Ibuprofen; Penicillins; Seroquel [quetiapine]; and Onion   Review of Systems Review of Systems  All other systems reviewed and are negative.    Physical Exam Updated Vital Signs BP 113/82 (BP Location: Right Arm)   Pulse 69   Temp 97.7 F (36.5 C) (Oral)   Resp (!) 30   Ht  (1.803 m)   Wt 81.6 kg   SpO2 100%   BMI 25.10 kg/m   Physical Exam  Constitutional: He is oriented to person, place, and time. He appears well-developed and well-nourished. No distress.  Patient resting in bed in no acute discomfort.  HENT:  Head: Atraumatic.  Eyes: Conjunctivae are normal.  Neck: Neck supple.  Cardiovascular: Normal rate and regular rhythm.   Pulmonary/Chest: Effort normal and breath sounds normal.  Wheezes heard without rales or rhonchi  Abdominal: Soft. He exhibits no distension.  Neurological: He is alert and oriented to person, place, and time.  Skin: No rash noted.  Psychiatric: He has a normal mood and affect. His speech is normal and behavior is normal. He  expresses homicidal and suicidal ideation.  Nursing note and vitals reviewed.    ED Treatments / Results  Labs (all labs ordered are listed, but only abnormal results are displayed) Labs Reviewed  CBC - Abnormal; Notable for the following:       Result Value   RDW 15.7 (*)    All other components within normal limits  COMPREHENSIVE METABOLIC PANEL - Abnormal; Notable for the following:    CO2 21 (*)    AST 45 (*)    Total Bilirubin 1.9 (*)    All other components within normal limits  ETHANOL  RAPID URINE DRUG SCREEN, HOSP PERFORMED  I-STAT TROPOININ, ED    EKG  EKG Interpretation  Date/Time:  Friday August 30 2016 10:40:28 EDT Ventricular Rate:  66 PR Interval:    QRS Duration: 88 QT Interval:  388 QTC Calculation: 407 R Axis:   70 Text Interpretation:  Sinus rhythm since last tracing no significant change Confirmed by Effie Shy  MD, ELLIOTT ([id number]) on 08/30/2016 10:56:29 AM       Radiology Dg Chest 2 View  Result Date: 08/30/2016 CLINICAL DATA:  Difficulty breathing with cough and congestion over the last few days. EXAM: CHEST  2 VIEW COMPARISON:  08/10/2016 FINDINGS: Chest radiograph is improved since previous study with less atelectasis in the lower lungs. Again demonstrated is upper lung emphysema, central bronchial thickening and mild residual lower lung atelectasis. No worsening or new finding. IMPRESSION: Radiographic improvement since the study of 08/10/2016. Less lower lung atelectasis. Re- demonstration of emphysema. Electronically Signed   By: Paulina Fusi M.D.   On: 08/30/2016 11:51    Procedures Procedures (including critical care time)  Medications Ordered in ED Medications  risperiDONE (RISPERDAL) tablet 2 mg (not administered)  LORazepam (ATIVAN) tablet 1 mg (not administered)  acetaminophen (TYLENOL) tablet 650 mg (not administered)  zolpidem (AMBIEN) tablet 5 mg (not administered)  nicotine (NICODERM CQ - dosed in mg/24 hours) patch 21 mg (not  administered)  ondansetron (ZOFRAN) tablet 4  mg (not administered)  alum & mag hydroxide-simeth (MAALOX/MYLANTA) 200-200-20 MG/5ML suspension 30 mL (not administered)  albuterol (PROVENTIL HFA;VENTOLIN HFA) 108 (90 Base) MCG/ACT inhaler 1-2 puff (not administered)  amLODipine (NORVASC) tablet 5 mg (not administered)  atorvastatin (LIPITOR) tablet 20 mg (not administered)  DULoxetine (CYMBALTA) DR capsule 30 mg (not administered)  QUEtiapine (SEROQUEL) tablet 100 mg (not administered)  theophylline (THEO-24) 24 hr capsule 300 mg (not administered)  albuterol (PROVENTIL,VENTOLIN) solution continuous neb (10 mg/hr Nebulization Given 08/30/16 1246)  ipratropium (ATROVENT) nebulizer solution 0.5 mg (0.5 mg Nebulization Given 08/30/16 1246)     Initial Impression / Assessment and Plan / ED Course  I have reviewed the triage vital signs and the nursing notes.  Pertinent labs & imaging results that were available during my care of the patient were reviewed by me and considered in my medical decision making (see chart for details).     BP 117/80   Pulse (!) 59   Temp 97.7 F (36.5 C) (Oral)   Resp 19   Ht  (1.803 m)   Wt 81.6 kg   SpO2 98%   BMI 25.10 kg/m    Final Clinical Impressions(s) / ED Diagnoses   Final diagnoses:  Cocaine abuse  Suicidal ideation  Homicidal ideation  COPD exacerbation (HCC)    New Prescriptions New Prescriptions   No medications on file   12:00 PM Patient report having a cocaine binge for the past 4 days as well as having suicidal ideation and homicidal ideation. Also endorsed chest pain shortness of breath. He tells me his symptoms stem from his brother killing his mother 5 days ago. Of note, patient was seen earlier this month for the exact same symptoms, at that time he also reported that his mother was killed by his brother several days prior. He was tested positive for cocaine use during last visit as well. Plan to consult TTS for further  evaluation of his condition. Care discussed with Dr. Effie Shy.    12:29 PM Hx of COPD, still wheezes despite receiving 3 treatments via EMS en route.  He however able to speak in complete sentences and not in any acute respiratory distress.  Will give continuous duonebs.  CP likely 2/2 cocaine use.    1:40 PM Pt received hr long nebs and felt better.  Pt is medically cleared.  Will have TTS to evaluate pt and determine disposition.     Fayrene Helper, PA-C 08/30/16 1344    Mancel Bale, MD 08/30/16 [phone]

## 2016-08-30 NOTE — ED Notes (Signed)
Pt reports he has allergy to tomatoes and ate the tomato on his meal tray. EDP made aware.

## 2016-08-30 NOTE — ED Notes (Signed)
Received pt from [facility]-- pt placed in scrubs, wanded by security.  moved into rm F10-- pt admits to having suicidal thoughts-- smoked "$750 worth of crack, havent slept in 4 days. I was trying to kill myself."

## 2016-08-30 NOTE — ED Notes (Signed)
Per pharmacy give Benztropine now and hold 2200 dose and restart medication in the am.
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
## 2016-08-30 NOTE — ED Triage Notes (Signed)
Per EMS pt has been on a 3 day cocaine binge and is complaining of SOB and chest pain.  Wheezing heard on expiration.  Pt given 2 duonabs and 2 albuterol treatments.  Pt states solumedrol makes him vomit.  Pt on monitor.  Appears in no distress.

## 2016-08-30 NOTE — ED Notes (Signed)
Patient transported to X-ray 

## 2016-08-30 NOTE — ED Notes (Signed)
Heated up pt dinner tray for him.

## 2016-08-30 NOTE — ED Notes (Signed)
Pt c/o feeling SOB. Wheezing noted, O2 sat 100% RA. MD made aware.

## 2016-08-31 MED ORDER — THEOPHYLLINE ER 300 MG PO TB12
300.0000 mg | ORAL_TABLET | Freq: Every day | ORAL | Status: DC
  Administered 2016-08-31: 300 mg via ORAL
  Filled 2016-08-31: qty 1

## 2016-08-31 NOTE — ED Notes (Signed)
Breakfast ordered 

## 2016-08-31 NOTE — ED Notes (Signed)
Pt getting dressed. ALL belongings - 1 labeled belongings bag and 1 valuables envelope - returned to pt - pt verified all items present. Pt also aware, per Carney Bern, [facility] will be picking him up in about an hour. Pt to wait in lobby.

## 2016-08-31 NOTE — ED Notes (Signed)
Woke pt so may VS may be taken.

## 2016-08-31 NOTE — ED Notes (Signed)
Breakfast arrived to patients bedside 

## 2016-08-31 NOTE — Progress Notes (Addendum)
Pt. has been cleared for d/c to the care of his ACTT team, [facility] [phone].  CSW notified [facility].  Timmothy Euler. Kaylyn Lim, MSW, LCSWA Clinical Social Work Disposition [phone]     CSW was contacted by [facility] ED Nurse, Kriste Basque, requesting assistance with transportation. CSW left message for [facility] on their crisis line [phone] and received a call back from them.  Per Vernona Rieger at [facility], she will seek an ACTT team member to pick pt up and will contact CSW w/ETA.  CSW notified [facility] nurse of same.  CW contacted by [facility] crisis counselor who says someone will pick client up but are not able to come until 1630.  CSW notified [facility] ED nurse of same.  Timmothy Euler. Kaylyn Lim, MSW, LCSWA Clinical Social Work Disposition [phone]

## 2016-08-31 NOTE — ED Notes (Signed)
Pt woke briefly - assessment performed then pt returned to sleeping.

## 2016-08-31 NOTE — BHH Counselor (Signed)
TTS Reassessment: Pt was laying in bed drowsy when assessed. Writer asked if he is still feeling  suicidal and he states that he "doesn't know he just woke up". He states that he went on a crack binge and spent $740.00 and this is the first time in a year that he has used. Pt was in the ED the beginning of the month and was positive for cocaine at that time as well. Pt is a poor historian and has a history of multiple ED admissions to the [facility] system as well as [facility]. Pt currently has an ACT team with [facility] [phone] and has an aide that helps him 3 hours a day per RN. Pt currently denies HI and states that he is having some auditory hallucinations that sound "scrambled". He states that he is taking his medications as prescribed. Pt appears to be at baseline and has follow up support with ACT team. Per Elta Guadeloupe NP pt can be sent home to follow up with [facility]. CSW was notified and will be calling ACT team as well as giving out substance abuse resources.   [address] [place], [address]

## 2016-11-04 ENCOUNTER — Emergency Department (HOSPITAL_COMMUNITY): Payer: Medicaid Other

## 2016-11-04 ENCOUNTER — Encounter (HOSPITAL_COMMUNITY): Payer: Self-pay | Admitting: Emergency Medicine

## 2016-11-04 ENCOUNTER — Observation Stay (HOSPITAL_COMMUNITY)
Admission: EM | Admit: 2016-11-04 | Discharge: 2016-11-05 | Disposition: A | Discharge status: Home / Self Care | Payer: Medicaid Other | Attending: Internal Medicine | Admitting: Internal Medicine

## 2016-11-04 DIAGNOSIS — F1020 Alcohol dependence, uncomplicated: Secondary | ICD-10-CM | POA: Diagnosis not present

## 2016-11-04 DIAGNOSIS — J45901 Unspecified asthma with (acute) exacerbation: Secondary | ICD-10-CM | POA: Diagnosis not present

## 2016-11-04 DIAGNOSIS — N401 Enlarged prostate with lower urinary tract symptoms: Secondary | ICD-10-CM

## 2016-11-04 DIAGNOSIS — K449 Diaphragmatic hernia without obstruction or gangrene: Secondary | ICD-10-CM | POA: Insufficient documentation

## 2016-11-04 DIAGNOSIS — I10 Essential (primary) hypertension: Secondary | ICD-10-CM | POA: Diagnosis present

## 2016-11-04 DIAGNOSIS — Z809 Family history of malignant neoplasm, unspecified: Secondary | ICD-10-CM

## 2016-11-04 DIAGNOSIS — R630 Anorexia: Secondary | ICD-10-CM

## 2016-11-04 DIAGNOSIS — F17210 Nicotine dependence, cigarettes, uncomplicated: Secondary | ICD-10-CM

## 2016-11-04 DIAGNOSIS — Z9114 Patient's other noncompliance with medication regimen: Secondary | ICD-10-CM

## 2016-11-04 DIAGNOSIS — E119 Type 2 diabetes mellitus without complications: Secondary | ICD-10-CM | POA: Insufficient documentation

## 2016-11-04 DIAGNOSIS — F209 Schizophrenia, unspecified: Secondary | ICD-10-CM | POA: Insufficient documentation

## 2016-11-04 DIAGNOSIS — J441 Chronic obstructive pulmonary disease with (acute) exacerbation: Secondary | ICD-10-CM | POA: Diagnosis not present

## 2016-11-04 DIAGNOSIS — Z886 Allergy status to analgesic agent status: Secondary | ICD-10-CM | POA: Diagnosis not present

## 2016-11-04 DIAGNOSIS — R0602 Shortness of breath: Secondary | ICD-10-CM | POA: Diagnosis present

## 2016-11-04 DIAGNOSIS — E7800 Pure hypercholesterolemia, unspecified: Secondary | ICD-10-CM | POA: Insufficient documentation

## 2016-11-04 DIAGNOSIS — G8929 Other chronic pain: Secondary | ICD-10-CM | POA: Diagnosis not present

## 2016-11-04 DIAGNOSIS — Z880 Allergy status to penicillin: Secondary | ICD-10-CM | POA: Insufficient documentation

## 2016-11-04 DIAGNOSIS — D649 Anemia, unspecified: Secondary | ICD-10-CM | POA: Diagnosis not present

## 2016-11-04 DIAGNOSIS — Z79899 Other long term (current) drug therapy: Secondary | ICD-10-CM | POA: Diagnosis not present

## 2016-11-04 DIAGNOSIS — J9601 Acute respiratory failure with hypoxia: Secondary | ICD-10-CM | POA: Diagnosis not present

## 2016-11-04 DIAGNOSIS — K5900 Constipation, unspecified: Secondary | ICD-10-CM

## 2016-11-04 DIAGNOSIS — K219 Gastro-esophageal reflux disease without esophagitis: Secondary | ICD-10-CM | POA: Diagnosis not present

## 2016-11-04 DIAGNOSIS — R300 Dysuria: Secondary | ICD-10-CM

## 2016-11-04 DIAGNOSIS — F319 Bipolar disorder, unspecified: Secondary | ICD-10-CM | POA: Insufficient documentation

## 2016-11-04 DIAGNOSIS — N400 Enlarged prostate without lower urinary tract symptoms: Secondary | ICD-10-CM | POA: Diagnosis present

## 2016-11-04 DIAGNOSIS — L0889 Other specified local infections of the skin and subcutaneous tissue: Secondary | ICD-10-CM

## 2016-11-04 DIAGNOSIS — Z91018 Allergy to other foods: Secondary | ICD-10-CM

## 2016-11-04 DIAGNOSIS — J9600 Acute respiratory failure, unspecified whether with hypoxia or hypercapnia: Secondary | ICD-10-CM | POA: Diagnosis present

## 2016-11-04 DIAGNOSIS — D899 Disorder involving the immune mechanism, unspecified: Secondary | ICD-10-CM | POA: Diagnosis not present

## 2016-11-04 DIAGNOSIS — F1410 Cocaine abuse, uncomplicated: Secondary | ICD-10-CM | POA: Diagnosis not present

## 2016-11-04 DIAGNOSIS — F419 Anxiety disorder, unspecified: Secondary | ICD-10-CM | POA: Insufficient documentation

## 2016-11-04 DIAGNOSIS — Z716 Tobacco abuse counseling: Secondary | ICD-10-CM | POA: Insufficient documentation

## 2016-11-04 DIAGNOSIS — M87859 Other osteonecrosis, unspecified femur: Secondary | ICD-10-CM

## 2016-11-04 DIAGNOSIS — Z825 Family history of asthma and other chronic lower respiratory diseases: Secondary | ICD-10-CM

## 2016-11-04 DIAGNOSIS — K921 Melena: Secondary | ICD-10-CM

## 2016-11-04 DIAGNOSIS — J309 Allergic rhinitis, unspecified: Secondary | ICD-10-CM | POA: Diagnosis present

## 2016-11-04 DIAGNOSIS — F17200 Nicotine dependence, unspecified, uncomplicated: Secondary | ICD-10-CM | POA: Diagnosis present

## 2016-11-04 DIAGNOSIS — M87051 Idiopathic aseptic necrosis of right femur: Secondary | ICD-10-CM | POA: Diagnosis present

## 2016-11-04 DIAGNOSIS — R61 Generalized hyperhidrosis: Secondary | ICD-10-CM

## 2016-11-04 LAB — CBC WITH DIFFERENTIAL/PLATELET
BASOS PCT: 0 %
Basophils Absolute: 0 10*3/uL (ref 0.0–0.1)
EOS ABS: 0.2 10*3/uL (ref 0.0–0.7)
Eosinophils Relative: 3 %
HEMATOCRIT: 39.8 % (ref 39.0–52.0)
HEMOGLOBIN: 12.7 g/dL — AB (ref 13.0–17.0)
LYMPHS ABS: 1.4 10*3/uL (ref 0.7–4.0)
Lymphocytes Relative: 24 %
MCH: 28 pg (ref 26.0–34.0)
MCHC: 31.9 g/dL (ref 30.0–36.0)
MCV: 87.9 fL (ref 78.0–100.0)
MONOS PCT: 10 %
Monocytes Absolute: 0.6 10*3/uL (ref 0.1–1.0)
NEUTROS ABS: 3.8 10*3/uL (ref 1.7–7.7)
NEUTROS PCT: 63 %
Platelets: 174 10*3/uL (ref 150–400)
RBC: 4.53 MIL/uL (ref 4.22–5.81)
RDW: 16.5 % — ABNORMAL HIGH (ref 11.5–15.5)
WBC: 5.9 10*3/uL (ref 4.0–10.5)

## 2016-11-04 LAB — COMPREHENSIVE METABOLIC PANEL
ALT: 14 U/L — ABNORMAL LOW (ref 17–63)
ANION GAP: 6 (ref 5–15)
AST: 13 U/L — ABNORMAL LOW (ref 15–41)
Albumin: 2.9 g/dL — ABNORMAL LOW (ref 3.5–5.0)
Alkaline Phosphatase: 45 U/L (ref 38–126)
BUN: 12 mg/dL (ref 6–20)
CALCIUM: 8.5 mg/dL — AB (ref 8.9–10.3)
CHLORIDE: 115 mmol/L — AB (ref 101–111)
CO2: 22 mmol/L (ref 22–32)
CREATININE: 1.03 mg/dL (ref 0.61–1.24)
Glucose, Bld: 88 mg/dL (ref 65–99)
Potassium: 3.9 mmol/L (ref 3.5–5.1)
SODIUM: 143 mmol/L (ref 135–145)
Total Bilirubin: 0.6 mg/dL (ref 0.3–1.2)
Total Protein: 5.3 g/dL — ABNORMAL LOW (ref 6.5–8.1)

## 2016-11-04 LAB — FERRITIN: FERRITIN: 75 ng/mL (ref 24–336)

## 2016-11-04 MED ORDER — ALBUTEROL (5 MG/ML) CONTINUOUS INHALATION SOLN
10.0000 mg/h | INHALATION_SOLUTION | Freq: Once | RESPIRATORY_TRACT | Status: AC
  Administered 2016-11-04: 10 mg/h via RESPIRATORY_TRACT
  Filled 2016-11-04: qty 20

## 2016-11-04 MED ORDER — PREDNISONE 20 MG PO TABS
40.0000 mg | ORAL_TABLET | Freq: Every day | ORAL | Status: DC
  Administered 2016-11-05: 40 mg via ORAL
  Filled 2016-11-04: qty 2

## 2016-11-04 MED ORDER — DULOXETINE HCL 30 MG PO CPEP
30.0000 mg | ORAL_CAPSULE | Freq: Every day | ORAL | Status: DC
  Administered 2016-11-04 – 2016-11-05 (×2): 30 mg via ORAL
  Filled 2016-11-04 (×2): qty 1

## 2016-11-04 MED ORDER — MOMETASONE FURO-FORMOTEROL FUM 200-5 MCG/ACT IN AERO
1.0000 | INHALATION_SPRAY | Freq: Two times a day (BID) | RESPIRATORY_TRACT | Status: DC
  Administered 2016-11-04: 1 via RESPIRATORY_TRACT
  Filled 2016-11-04: qty 8.8

## 2016-11-04 MED ORDER — OXYCODONE-ACETAMINOPHEN 5-325 MG PO TABS: 1.0000 | ORAL_TABLET | Freq: Four times a day (QID) | ORAL | Status: DC | PRN

## 2016-11-04 MED ORDER — ENOXAPARIN SODIUM 40 MG/0.4ML ~~LOC~~ SOLN
40.0000 mg | SUBCUTANEOUS | Status: DC
  Administered 2016-11-04 – 2016-11-05 (×2): 40 mg via SUBCUTANEOUS
  Filled 2016-11-04 (×2): qty 0.4

## 2016-11-04 MED ORDER — DIVALPROEX SODIUM 500 MG PO DR TAB
500.0000 mg | DELAYED_RELEASE_TABLET | Freq: Two times a day (BID) | ORAL | Status: DC
  Administered 2016-11-04 – 2016-11-05 (×2): 500 mg via ORAL
  Filled 2016-11-04 (×3): qty 1

## 2016-11-04 MED ORDER — FLUTICASONE PROPIONATE 50 MCG/ACT NA SUSP
1.0000 | Freq: Every day | NASAL | Status: DC
  Administered 2016-11-04 – 2016-11-05 (×2): 1 via NASAL
  Filled 2016-11-04: qty 16

## 2016-11-04 MED ORDER — TAMSULOSIN HCL 0.4 MG PO CAPS
0.4000 mg | ORAL_CAPSULE | Freq: Every day | ORAL | Status: DC
  Administered 2016-11-04 – 2016-11-05 (×2): 0.4 mg via ORAL
  Filled 2016-11-04 (×2): qty 1

## 2016-11-04 MED ORDER — PREDNISONE 20 MG PO TABS
60.0000 mg | ORAL_TABLET | Freq: Once | ORAL | Status: AC
  Administered 2016-11-04: 60 mg via ORAL
  Filled 2016-11-04: qty 3

## 2016-11-04 MED ORDER — TIOTROPIUM BROMIDE MONOHYDRATE 18 MCG IN CAPS: 18.0000 ug | ORAL_CAPSULE | Freq: Every day | RESPIRATORY_TRACT | Status: DC

## 2016-11-04 MED ORDER — FINASTERIDE 5 MG PO TABS
5.0000 mg | ORAL_TABLET | Freq: Every day | ORAL | Status: DC
  Administered 2016-11-04 – 2016-11-05 (×2): 5 mg via ORAL
  Filled 2016-11-04 (×2): qty 1

## 2016-11-04 MED ORDER — LORATADINE 10 MG PO TABS
10.0000 mg | ORAL_TABLET | Freq: Every day | ORAL | Status: DC
  Administered 2016-11-04 – 2016-11-05 (×2): 10 mg via ORAL
  Filled 2016-11-04 (×2): qty 1

## 2016-11-04 MED ORDER — IPRATROPIUM-ALBUTEROL 0.5-2.5 (3) MG/3ML IN SOLN
3.0000 mL | Freq: Three times a day (TID) | RESPIRATORY_TRACT | Status: DC
  Administered 2016-11-05 (×2): 3 mL via RESPIRATORY_TRACT
  Filled 2016-11-04 (×2): qty 3

## 2016-11-04 MED ORDER — IPRATROPIUM-ALBUTEROL 0.5-2.5 (3) MG/3ML IN SOLN
3.0000 mL | Freq: Four times a day (QID) | RESPIRATORY_TRACT | Status: DC
  Administered 2016-11-04 (×2): 3 mL via RESPIRATORY_TRACT
  Filled 2016-11-04 (×2): qty 3

## 2016-11-04 MED ORDER — FLUPHENAZINE HCL 10 MG PO TABS
10.0000 mg | ORAL_TABLET | Freq: Every morning | ORAL | Status: DC
  Administered 2016-11-05: 10 mg via ORAL
  Filled 2016-11-04: qty 1

## 2016-11-04 MED ORDER — AMLODIPINE BESYLATE 5 MG PO TABS
5.0000 mg | ORAL_TABLET | Freq: Every day | ORAL | Status: DC
  Administered 2016-11-04 – 2016-11-05 (×2): 5 mg via ORAL
  Filled 2016-11-04 (×2): qty 1

## 2016-11-04 MED ORDER — FLUPHENAZINE HCL 10 MG PO TABS
20.0000 mg | ORAL_TABLET | Freq: Every day | ORAL | Status: DC
  Administered 2016-11-04: 20 mg via ORAL
  Filled 2016-11-04 (×2): qty 2

## 2016-11-04 NOTE — H&P (Signed)
Date: 11/04/2016               Patient Name:  Francisco Beard MRN: [id number]  DOB: 25-Sep-1960 Age / Sex: 56 y.o., male   PCP: Patient, No Pcp Per         Medical Service: Internal Medicine Teaching Service         Attending Physician: Dr. Mikey Bussing, Marthenia Rolling, DO    First Contact: Dr. Samuella Cota Pager: [phone]  Second Contact: Dr. Earlene Plater Pager: [phone]       After Hours (After 5p/  First Contact Pager: [phone]  weekends / holidays): Second Contact Pager: [phone]   Chief Complaint: "couldn't breathe"  History of Present Illness: Francisco Beard is a 56 year old man with asthma, COPD, allergic rhinitis ongoing tobacco use, chronic hip pain from avascular necrosis, schizophrenia, benign prostatic hyperplasia, hypertension who presented to the ED this morning by EMS for becoming progressively short of breath over the past week with associated chest tightness, orthopnea, and wheezing  His symptoms are particularly worse with this time of the year and exposure to dust and grass. He recalls being hospitalized 5 times/month since January 2018 for his breathing though has never been intubated. He acknowledges using albuterol 4-5 times/day and tiotropium twice daily without much relief though has missed doses of his mometasone/salmeterol because he saw a TV ad which cited pneumonia as an adverse effect. On a good day, he can walk up to 10 miles without feeling breathless. He used to smoke up to 3 packs/day since the age of 18 though is now down to 3 cigarettes a day and has not smoked crack or consumed alcohol in the past year. Though he reports having a history of CHF, his most recent echo 07/16/16 noted EF 65-70% without diastolic dysfunction and has never had a heart attack. He denies any leg swelling or chest pain.  In the ED, he received an hour-long albuterol nebulizer treatment and prednisone 60 mg. Initial lab work noted mild normochromic anemia Hb 12.7 [baseline 12-13] since February 2018.   Meds:    Current Meds  Medication Sig  . albuterol (PROVENTIL HFA;VENTOLIN HFA) 108 (90 Base) MCG/ACT inhaler Inhale 1-2 puffs into the lungs every 4 (four) hours as needed for wheezing or shortness of breath.  Marland Kitchen albuterol (PROVENTIL) (2.5 MG/3ML) 0.083% nebulizer solution Take 3 mLs (2.5 mg total) by nebulization every 4 (four) hours as needed for wheezing or shortness of breath.  Marland Kitchen amLODipine (NORVASC) 5 MG tablet Take 1 tablet (5 mg total) by mouth daily. RESUME IN 3 DAYS. (Patient taking differently: Take 5 mg by mouth daily. )  . Ascorbic Acid (VITAMIN C PO) Take 1 tablet by mouth daily.  Marland Kitchen atorvastatin (LIPITOR) 20 MG tablet Take 20 mg by mouth daily.  . cholecalciferol (VITAMIN D) 1000 units tablet Take 1,000 Units by mouth 2 (two) times daily.  . Cholecalciferol (VITAMIN D3) 1000 units CAPS Take 1 capsule by mouth daily.  . DULoxetine (CYMBALTA) 30 MG capsule Take 30 mg by mouth daily.  . famotidine (PEPCID AC) 10 MG chewable tablet Chew 10 mg by mouth 2 (two) times daily as needed for heartburn.  . ferrous gluconate (FERGON) 324 MG tablet Take 324 mg by mouth daily.  . fluPHENAZine (PROLIXIN) 10 MG tablet Take 10-20 mg by mouth See admin instructions. Pt takes 10mg  in am, 20mg  in pm  . folic acid (FOLVITE) 1 MG tablet Take 1 mg by mouth daily.  Marland Kitchen HYDROcodone-acetaminophen (NORCO/VICODIN) 5-325 MG  tablet Take 1 tablet by mouth every 6 (six) hours as needed for moderate pain.  . isosorbide mononitrate (IMDUR) 30 MG 24 hr tablet Take 1 tablet (30 mg total) by mouth daily.  . mometasone-formoterol (DULERA) 200-5 MCG/ACT AERO Inhale 1 puff into the lungs 2 (two) times daily.  Marland Kitchen. oxyCODONE-acetaminophen (PERCOCET/ROXICET) 5-325 MG tablet Take 1 tablet by mouth every 4 (four) hours as needed for pain.  . pantoprazole (PROTONIX) 40 MG tablet Take 1 tablet (40 mg total) by mouth 2 (two) times daily.  Marland Kitchen. senna-docusate (SENOKOT-S) 8.6-50 MG tablet Take 2 tablets by mouth at bedtime.  . theophylline  (THEO-24) 300 MG 24 hr capsule Take 300 mg by mouth daily.  Marland Kitchen. thiamine 100 MG tablet Take 1 tablet (100 mg total) by mouth daily.  Marland Kitchen. tiotropium (SPIRIVA HANDIHALER) 18 MCG inhalation capsule Place 1 capsule (18 mcg total) into inhaler and inhale daily.     Allergies: Allergies as of 11/04/2016 - Review Complete 11/04/2016  Allergen Reaction Noted  . Aleve [naproxen] Other (See Comments) 08/14/2013  . Aspirin Swelling 04/28/2013  . Ibuprofen Swelling 07/09/2016  . Penicillins Swelling 04/28/2013  . Seroquel [quetiapine] Other (See Comments) 04/01/2016  . Strawberry extract Anaphylaxis 08/14/2013  . Tomato Anaphylaxis 08/14/2013  . Onion Nausea And Vomiting and Rash 08/14/2013   Past Medical History:  Diagnosis Date  . Anxiety   . Arthritis    "hips; bottom part of my back" (07/09/2016)  . Asthmatic bronchitis   . Bipolar disorder (HCC)   . CHF (congestive heart failure) (HCC)   . Chronic bronchitis (HCC)   . Chronic hip pain   . COPD (chronic obstructive pulmonary disease) (HCC)   . Depression   . GERD (gastroesophageal reflux disease)   . High cholesterol   . History of hiatal hernia   . Hypertension   . Irregular heart beats   . Pneumonia    "3 times; it's been awhile" (07/09/2016)  . Schizophrenia (HCC)   . Type II diabetes mellitus (HCC)     Family History: Brother and sister with heart murmur. Mother with asthma though passed away from "a big heart." No heart attack. Cancer in multiple members though unable to describe or specify.  Social History: Lives in a boarding house with his male partner who has mental illness.   Review of Systems: Intermittent constipation associated with melena. Chronic dysuria, poor appetite, night  sweats (since childhood). A complete ROS was negative except as per HPI.   Physical Exam: Blood pressure 114/68, pulse 76, temperature 97.5 F (36.4 C), temperature source Rectal, resp. rate 16, height 5\' 11"  (1.803 m), weight 180 lb (81.6  kg), SpO2 99 %. Physical Exam  Constitutional: He is oriented to person, place, and time. No distress.  HENT:  Head: Normocephalic and atraumatic.  Poor dentition  Eyes: Conjunctivae are normal. No scleral icterus.  Cardiovascular: Normal rate and intact distal pulses.   Pulmonary/Chest: Effort normal. He has wheezes (Diffiuse in all lung fields on auscultation).  Abdominal: Soft. He exhibits no distension.  Neurological: He is alert and oriented to person, place, and time.  Skin: Skin is warm and dry. He is not diaphoretic.   EKG: I reviewed and compared with 08/30/2016. Normal sinus rhythm, axis  CXR: I reviewed and compared with 10/21/16. No focal infiltrate or consolidation. Interpretation limited by poor inspiratory effort. Possibly mild degenerative disc changes of the lower thoracic spine on lateral film.  Assessment & Plan by Problem: Principal Problem:   Acute respiratory  failure (HCC) Active Problems:   Schizophrenia (HCC)   Allergic rhinitis   Tobacco use disorder   BPH (benign prostatic hyperplasia)  Mr. Nijjar is a 56 year old man with asthma, COPD, allergic rhinitis ongoing tobacco use, chronic hip pain from avascular necrosis, schizophrenia, benign prostatic hyperplasia, hypertension hospitalized for acute respiratory failure found to have mild normochromic anemia.  Acute respiratory failure: Multifactorial in that he likely has seasonal worsening with asthma due to his allergies with superimposed COPD. No PFTs on file to review. His non-adherence to inhaled corticosteroid and long-acting beta agonist and ongoing tobacco use is also contributory. -Continue prednisone 40 mg  -Continue ipratropium/albuterol every 6 hours with albuterol as needed for wheezing -Order physical therapy to mobilize -Continue fluticasone nasal spray and loratadine 10 mg for allergies -Encourage cessation from tobacco use -Check UDS and HIV given history of illicit substance use and  Mild  normochromic anemia: If he has early iron deficiency, he warrants further workup, especially if he is unsure of whether his immediate family members had colon cancer or not. No colonoscopy on file. Ferritin 19 in September 2017 in Care Everywhere in the setting of NSAID-induced gastritis for which he underwent EGD though cannot locate report. -Check ferritin and consider additional workup ther  Schizophrenia: Self-reported. Unclear of which medications he is on. -Contact outpatient mental health provider.  Non-insulin dependent Type 2 diabetes: Self-reported though he has several values on chart review which suggest hyperglycemia.  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 His medications included tamsulosin and finasteride, neither of which are on his current medication list. -Continue reassessing for symptoms  Chronic hip pain from avascular necrosis: Continue oxycodone/acetaminophen 5 mg every 6 hours as needed.  Hypertension: Continue amlodipine 5 mg.  #FEN:  -Diet: Regular  #DVT prophylaxis: Lovenox  #CODE STATUS: FULL CODE -Defer to sister Ananda Sitzer [[phone]] if patients lacks decision-making capacity -Confirmed with patient on admission  Dispo: Admit patient to Inpatient with expected length of stay greater than 2 midnights.  Signed: Beather Arbour, MD 11/04/2016, 12:06 PM  Pager: [phone]

## 2016-11-04 NOTE — Progress Notes (Signed)
Pt admitted to the unit at 1316. Pt mental status is A&O x 4. Pt oriented to room, staff, and call bell. Skin is intact. Full assessment charted in [facility]. Call bell within reach. Visitor guidelines reviewed w/ pt and/or family.

## 2016-11-04 NOTE — ED Notes (Signed)
Attempted report x1. 

## 2016-11-04 NOTE — Progress Notes (Signed)
ED attempted IV placement with no success. IV team attempted as well, 2 different RNs, no success. No IV meds are ordered at this time. MD paged and made aware. Will continue to assess.

## 2016-11-04 NOTE — ED Triage Notes (Addendum)
[facility] with c/o sob, wheezing since last week, worse since last night. Per EMS, he was dx with PNA 1 wk ago and finished Levaquin doses a few days ago. Pt given 0.5 atrovent and 10 of albuterol en route. Wheezes, productive cough on arrival, able to speak full sentences. 96% on RA. Former 3PPD smoker, now Science writer3 cigarettes/daily. Pt states he is a "heavy drinker and crack user, all types of ETOH all day, last drink liquor last night, also did crack".

## 2016-11-04 NOTE — ED Provider Notes (Signed)
[facility]-EMERGENCY DEPT Provider Note   CSN: [id number] Arrival date & time: 11/04/16  7829     History   Chief Complaint Chief Complaint  Patient presents with  . Shortness of Breath    HPI Donivan Thammavong is a 56 y.o. male.Patient reports nonproductive cough and shortness of breath typical of COPD for the past week. He completed a course of Levaquin yesterday. He reports that he was recently diagnosed with pneumonia at [facility]. He denies any fever. He admits to vomiting one time today. He admits to drinking a pint of vodka last night as well as one beer. Also admits to smoking crack last time 2 days ago. Denies any IV drug use denies fever. Treated by EMS with nebulized treatment with improvement of breathing. States breathing is not baseline. Denies nausea presently. He is presently hungry  HPI  Past Medical History:  Diagnosis Date  . Anxiety   . Arthritis    "hips; bottom part of my back" (07/09/2016)  . Asthmatic bronchitis   . Bipolar disorder (HCC)   . CHF (congestive heart failure) (HCC)   . Chronic bronchitis (HCC)   . Chronic hip pain   . COPD (chronic obstructive pulmonary disease) (HCC)   . Depression   . GERD (gastroesophageal reflux disease)   . High cholesterol   . History of hiatal hernia   . Hypertension   . Irregular heart beats   . Pneumonia    "3 times; it's been awhile" (07/09/2016)  . Schizophrenia (HCC)   . Type II diabetes mellitus [facility])     Patient Active Problem List   Diagnosis Date Noted  . Acute exacerbation of COPD with asthma (HCC) 07/09/2016  . GERD (gastroesophageal reflux disease) 07/09/2016  . Polysubstance abuse 07/09/2016  . Homicidal ideation   . Bipolar disorder (HCC) 04/17/2016  . HTN (hypertension) 04/17/2016  . Chest pain   . COPD exacerbation (HCC) 04/13/2016  . COPD (chronic obstructive pulmonary disease) (HCC) 04/13/2016  . Avascular necrosis of bone of right hip (HCC) 04/08/2016  . COPD with acute  exacerbation (HCC) 04/01/2016  . Suicidal ideations 08/17/2013    Past Surgical History:  Procedure Laterality Date  . CLOSED REDUCTION HAND FRACTURE Right    "got 6 screws and a plate in there"  . ELBOW FRACTURE SURGERY Left    "put it through the wall"  . FRACTURE SURGERY         Home Medications    Prior to Admission medications   Medication Sig Start Date End Date Taking? Authorizing Provider  albuterol (PROVENTIL HFA;VENTOLIN HFA) 108 (90 Base) MCG/ACT inhaler Inhale 1-2 puffs into the lungs every 4 (four) hours as needed for wheezing or shortness of breath. 04/17/16   Rodolph Bong, MD  albuterol (PROVENTIL) (2.5 MG/3ML) 0.083% nebulizer solution Take 3 mLs (2.5 mg total) by nebulization every 4 (four) hours as needed for wheezing or shortness of breath. 07/24/16   Rodolph Bong, MD  amLODipine (NORVASC) 5 MG tablet Take 1 tablet (5 mg total) by mouth daily. RESUME IN 3 DAYS. 04/20/16   Rodolph Bong, MD  Ascorbic Acid (VITAMIN C PO) Take 1 tablet by mouth daily.    [provider]  atorvastatin (LIPITOR) 20 MG tablet Take 20 mg by mouth daily.    [provider]  benztropine (COGENTIN) 1 MG tablet Take 1 tablet (1 mg total) by mouth 2 (two) times daily. 07/24/16   Rodolph Bong, MD  Cholecalciferol (VITAMIN D3)  1000 units CAPS Take 1 capsule by mouth daily.    [provider]  DULoxetine (CYMBALTA) 30 MG capsule Take 30 mg by mouth daily.    [provider]  famotidine (PEPCID AC) 10 MG chewable tablet Chew 10 mg by mouth 2 (two) times daily as needed for heartburn.    [provider]  famotidine-calcium carbonate-magnesium hydroxide (PEPCID COMPLETE) 10-800-165 MG chewable tablet Chew 10 mg by mouth daily as needed for heartburn.    [provider]  ferrous gluconate (FERGON) 324 MG tablet Take 324 mg by mouth daily.    [provider]  fluPHENAZine (PROLIXIN) 10 MG tablet Take 10-20 mg by mouth  See admin instructions. Pt takes 10mg  in am, 20mg  in pm    [provider]  folic acid (FOLVITE) 1 MG tablet Take 1 mg by mouth daily.    [provider]  HYDROcodone-acetaminophen (NORCO/VICODIN) 5-325 MG tablet Take 1 tablet by mouth every 6 (six) hours as needed for moderate pain. 04/17/16   Rodolph Bong, MD  isosorbide mononitrate (IMDUR) 30 MG 24 hr tablet Take 1 tablet (30 mg total) by mouth daily. 07/25/16   Rodolph Bong, MD  loratadine (CLARITIN) 10 MG tablet Take 10 mg by mouth daily as needed for allergies.    [provider]  mometasone-formoterol (DULERA) 200-5 MCG/ACT AERO Inhale 1 puff into the lungs 2 (two) times daily. 04/04/16   Narda Bonds, MD  pantoprazole (PROTONIX) 40 MG tablet Take 1 tablet (40 mg total) by mouth 2 (two) times daily. 07/24/16   Rodolph Bong, MD  polyethylene glycol [facility] Ethelene Hal) packet Take 17 g by mouth daily. 07/24/16   Rodolph Bong, MD  predniSONE (DELTASONE) 10 MG tablet Take 40mg  daily for 3days,Take 30mg  daily for 3days,Take 20mg  daily for 3days,Take 10mg  daily for 3days, then stop. Patient not taking: Reported on 08/30/2016 07/14/16   Rolly Salter, MD  predniSONE (DELTASONE) 20 MG tablet Take 1 tablet (20 mg total) by mouth daily with breakfast. Take 1 tablet daily x 3 days, then stop. Patient not taking: Reported on 08/30/2016 07/25/16   Rodolph Bong, MD  QUEtiapine (SEROQUEL) 100 MG tablet Take 1 tablet (100 mg total) by mouth at bedtime. 07/24/16   Rodolph Bong, MD  senna-docusate (SENOKOT-S) 8.6-50 MG tablet Take 2 tablets by mouth at bedtime. 04/17/16   Rodolph Bong, MD  sucralfate (CARAFATE) 1 g tablet Take 1 g by mouth 4 (four) times daily -  with meals and at bedtime.    [provider]  theophylline (THEO-24) 300 MG 24 hr capsule Take 300 mg by mouth daily.    [provider]  thiamine 100 MG tablet Take 1 tablet (100 mg total) by mouth daily. 07/24/16    Rodolph Bong, MD  tiotropium (SPIRIVA HANDIHALER) 18 MCG inhalation capsule Place 1 capsule (18 mcg total) into inhaler and inhale daily. 04/05/16   Narda Bonds, MD  Vitamin D, Ergocalciferol, (DRISDOL) 50000 units CAPS capsule Take 50,000 Units by mouth once a week.    [provider]    Family History No family history on file.  Social History Social History  Substance Use Topics  . Smoking status: Heavy Tobacco Smoker    Packs/day: 0.50    Years: 41.00    Types: Cigarettes    Last attempt to quit: 04/08/2016  . Smokeless tobacco: Never Used  . Alcohol use 12.0 oz/week    20 Standard  drinks or equivalent per week     Comment: 07/09/2016 "I had quit for 1 yr, relapsed 3 days ago"  Smokes crack. No IV drug use   Allergies   Aleve [naproxen]; Aspirin; Ibuprofen; Penicillins; Seroquel [quetiapine]; Strawberry extract; Tomato; and Onion   Review of Systems Review of Systems  Constitutional: Negative.   HENT: Negative.   Respiratory: Positive for cough and shortness of breath.   Cardiovascular: Positive for chest pain.       Syncope  Gastrointestinal: Positive for vomiting.  Musculoskeletal: Negative.   Skin: Negative.   Allergic/Immunologic: Positive for immunocompromised state.       Alcoholic  Neurological: Negative.   Psychiatric/Behavioral: Negative.   All other systems reviewed and are negative.    Physical Exam Updated Vital Signs BP 120/72   Pulse 69   Resp (!) 34   Ht 5\' 11"  (1.803 m)   Wt 81.6 kg (180 lb)   SpO2 96%   BMI 25.10 kg/m   Physical Exam  Constitutional: He appears well-developed and well-nourished.  HENT:  Head: Normocephalic and atraumatic.  Eyes: Conjunctivae are normal. Pupils are equal, round, and reactive to light.  Neck: Neck supple. No tracheal deviation present. No thyromegaly present.  Cardiovascular: Normal rate and regular rhythm.   No murmur heard. Pulmonary/Chest: Effort normal. No respiratory distress.    Diffuse rhonchi  Abdominal: Soft. Bowel sounds are normal. He exhibits no distension. There is no tenderness.  Musculoskeletal: Normal range of motion. He exhibits no edema or tenderness.  Neurological: He is alert. Coordination normal.  Skin: Skin is warm and dry. No rash noted.  Psychiatric: He has a normal mood and affect.  Nursing note and vitals reviewed.    ED Treatments / Results  Labs (all labs ordered are listed, but only abnormal results are displayed) Labs Reviewed  COMPREHENSIVE METABOLIC PANEL  CBC WITH DIFFERENTIAL/PLATELET    EKG  EKG Interpretation  Date/Time:  Monday November 04 2016 09:04:57 EDT Ventricular Rate:  67 PR Interval:    QRS Duration: 101 QT Interval:  398 QTC Calculation: 421 R Axis:   79 Text Interpretation:  Sinus rhythm Inferior infarct, acute (LCx) No significant change since last tracing Confirmed by Doug Sou [phone]) on 11/04/2016 9:26:35 AM     Chest x-ray viewed by me Results for orders placed or performed during the hospital encounter of 11/04/16  Comprehensive metabolic panel  Result Value Ref Range   Sodium 143 135 - 145 mmol/L   Potassium 3.9 3.5 - 5.1 mmol/L   Chloride 115 (H) 101 - 111 mmol/L   CO2 22 22 - 32 mmol/L   Glucose, Bld 88 65 - 99 mg/dL   BUN 12 6 - 20 mg/dL   Creatinine, Ser 6.04 0.61 - 1.24 mg/dL   Calcium 8.5 (L) 8.9 - 10.3 mg/dL   Total Protein 5.3 (L) 6.5 - 8.1 g/dL   Albumin 2.9 (L) 3.5 - 5.0 g/dL   AST 13 (L) 15 - 41 U/L   ALT 14 (L) 17 - 63 U/L   Alkaline Phosphatase 45 38 - 126 U/L   Total Bilirubin 0.6 0.3 - 1.2 mg/dL   GFR calc non Af Amer >60 >60 mL/min   GFR calc Af Amer >60 >60 mL/min   Anion gap 6 5 - 15  CBC with Differential/Platelet  Result Value Ref Range   WBC 5.9 4.0 - 10.5 K/uL   RBC 4.53 4.22 - 5.81 MIL/uL   Hemoglobin 12.7 (L) 13.0 - 17.0 g/dL  HCT 39.8 39.0 - 52.0 %   MCV 87.9 78.0 - 100.0 fL   MCH 28.0 26.0 - 34.0 pg   MCHC 31.9 30.0 - 36.0 g/dL   RDW 16.116.5 (H) 09.611.5 - 04.515.5  %   Platelets 174 150 - 400 K/uL   Neutrophils Relative % 63 %   Neutro Abs 3.8 1.7 - 7.7 K/uL   Lymphocytes Relative 24 %   Lymphs Abs 1.4 0.7 - 4.0 K/uL   Monocytes Relative 10 %   Monocytes Absolute 0.6 0.1 - 1.0 K/uL   Eosinophils Relative 3 %   Eosinophils Absolute 0.2 0.0 - 0.7 K/uL   Basophils Relative 0 %   Basophils Absolute 0.0 0.0 - 0.1 K/uL   Dg Chest 2 View  Result Date: 11/04/2016 CLINICAL DATA:  Cough and shortness of breath EXAM: CHEST  2 VIEW COMPARISON:  10/21/2016 FINDINGS: COPD with apical emphysema. Increased linear markings over the right upper lobe is stable from prior and had a chronic appearance on intervening 10/23/2016 chest CT. Normal heart size and mediastinal contours. No acute osseous finding. EKG leads create artifact over the chest. IMPRESSION: COPD without acute superimposed finding. Electronically Signed   By: Marnee SpringJonathon  Watts M.D.   On: 11/04/2016 10:17    Radiology No results found.  Procedures Procedures (including critical care time)  Medications Ordered in ED Medications  albuterol (PROVENTIL,VENTOLIN) solution continuous neb (not administered)     Initial Impression / Assessment and Plan / ED Course  I have reviewed the triage vital signs and the nursing notes.  Pertinent labs & imaging results that were available during my care of the patient were reviewed by me and considered in my medical decision making (see chart for details).     11:45 AM after treatment with one hour of continuous nebulization therapy patient's breathing is not baseline. He states he became lightheaded and dyspneic upon ambulating. I've consulted internal medicine resident physician to arrange for overnight stay. Patient will need to be watched closely for alcohol withdrawal Final Clinical Impressions(s) / ED Diagnoses  Diagnosis #1 exacerbation of COPD #2 chronic and acute alcohol abuse Final diagnoses:  None    New Prescriptions New Prescriptions   No  medications on file     Doug SouJacubowitz, Eden Toohey, MD 11/04/16 1200

## 2016-11-04 NOTE — ED Notes (Signed)
Pt ambulated around pod A with pulse ox. Pt tolerated well. SpO2 remained >95% HR 75. Pt reported his "lungs feel tight" but did not appear to be in any distress.

## 2016-11-04 NOTE — Progress Notes (Signed)
[facility].

## 2016-11-04 NOTE — ED Notes (Signed)
Patient transported to X-ray 

## 2016-11-04 NOTE — ED Notes (Signed)
Pt eating Mal[place] sandwich at this time. NAD. VSS

## 2016-11-05 DIAGNOSIS — N400 Enlarged prostate without lower urinary tract symptoms: Secondary | ICD-10-CM

## 2016-11-05 DIAGNOSIS — I10 Essential (primary) hypertension: Secondary | ICD-10-CM | POA: Diagnosis not present

## 2016-11-05 DIAGNOSIS — J441 Chronic obstructive pulmonary disease with (acute) exacerbation: Secondary | ICD-10-CM

## 2016-11-05 DIAGNOSIS — Z79899 Other long term (current) drug therapy: Secondary | ICD-10-CM | POA: Diagnosis not present

## 2016-11-05 DIAGNOSIS — R112 Nausea with vomiting, unspecified: Secondary | ICD-10-CM

## 2016-11-05 DIAGNOSIS — R7303 Prediabetes: Secondary | ICD-10-CM

## 2016-11-05 DIAGNOSIS — J9601 Acute respiratory failure with hypoxia: Secondary | ICD-10-CM | POA: Diagnosis not present

## 2016-11-05 DIAGNOSIS — F1410 Cocaine abuse, uncomplicated: Secondary | ICD-10-CM

## 2016-11-05 DIAGNOSIS — F209 Schizophrenia, unspecified: Secondary | ICD-10-CM

## 2016-11-05 DIAGNOSIS — D649 Anemia, unspecified: Secondary | ICD-10-CM

## 2016-11-05 DIAGNOSIS — Z886 Allergy status to analgesic agent status: Secondary | ICD-10-CM

## 2016-11-05 DIAGNOSIS — M87851 Other osteonecrosis, right femur: Secondary | ICD-10-CM

## 2016-11-05 DIAGNOSIS — Z888 Allergy status to other drugs, medicaments and biological substances status: Secondary | ICD-10-CM

## 2016-11-05 DIAGNOSIS — K2950 Unspecified chronic gastritis without bleeding: Secondary | ICD-10-CM

## 2016-11-05 DIAGNOSIS — Z880 Allergy status to penicillin: Secondary | ICD-10-CM

## 2016-11-05 LAB — BASIC METABOLIC PANEL
ANION GAP: 8 (ref 5–15)
BUN: 9 mg/dL (ref 6–20)
CALCIUM: 8.6 mg/dL — AB (ref 8.9–10.3)
CO2: 22 mmol/L (ref 22–32)
Chloride: 111 mmol/L (ref 101–111)
Creatinine, Ser: 1.08 mg/dL (ref 0.61–1.24)
GFR calc Af Amer: >60 mL/min (ref >60)
GLUCOSE: 98 mg/dL (ref 65–99)
Potassium: 3.5 mmol/L (ref 3.5–5.1)
SODIUM: 141 mmol/L (ref 135–145)

## 2016-11-05 LAB — CBC
HCT: 37.8 % — ABNORMAL LOW (ref 39.0–52.0)
HEMOGLOBIN: 12.3 g/dL — AB (ref 13.0–17.0)
MCH: 28.5 pg (ref 26.0–34.0)
MCHC: 32.5 g/dL (ref 30.0–36.0)
MCV: 87.5 fL (ref 78.0–100.0)
Platelets: 171 10*3/uL (ref 150–400)
RBC: 4.32 MIL/uL (ref 4.22–5.81)
RDW: 15.8 % — AB (ref 11.5–15.5)
WBC: 8.1 10*3/uL (ref 4.0–10.5)

## 2016-11-05 LAB — HEMOGLOBIN A1C
HEMOGLOBIN A1C: 6.4 % — AB (ref 4.8–5.6)
Mean Plasma Glucose: 137 mg/dL

## 2016-11-05 MED ORDER — BUDESONIDE-FORMOTEROL FUMARATE 80-4.5 MCG/ACT IN AERO: 2.0000 | INHALATION_SPRAY | Freq: Two times a day (BID) | RESPIRATORY_TRACT | 12 refills | Status: DC

## 2016-11-05 MED ORDER — PREDNISONE 20 MG PO TABS: 40.0000 mg | ORAL_TABLET | Freq: Every day | ORAL | 0 refills | Status: AC

## 2016-11-05 MED ORDER — AMLODIPINE BESYLATE 5 MG PO TABS: 5.0000 mg | ORAL_TABLET | Freq: Every day | ORAL | 0 refills | Status: DC

## 2016-11-05 MED ORDER — PREDNISONE 20 MG PO TABS: 40.0000 mg | ORAL_TABLET | Freq: Every day | ORAL | Status: DC

## 2016-11-05 MED ORDER — DIVALPROEX SODIUM 500 MG PO DR TAB: 500.0000 mg | DELAYED_RELEASE_TABLET | Freq: Two times a day (BID) | ORAL | 0 refills | Status: DC

## 2016-11-05 MED ORDER — PANTOPRAZOLE SODIUM 40 MG PO TBEC
40.0000 mg | DELAYED_RELEASE_TABLET | Freq: Two times a day (BID) | ORAL | Status: DC
  Administered 2016-11-05: 40 mg via ORAL
  Filled 2016-11-05: qty 1

## 2016-11-05 MED ORDER — ALBUTEROL SULFATE HFA 108 (90 BASE) MCG/ACT IN AERS: 1.0000 | INHALATION_SPRAY | RESPIRATORY_TRACT | 2 refills | Status: DC | PRN

## 2016-11-05 MED ORDER — FLUTICASONE PROPIONATE 50 MCG/ACT NA SUSP: 1.0000 | Freq: Every day | NASAL | 2 refills | Status: DC

## 2016-11-05 MED ORDER — TAMSULOSIN HCL 0.4 MG PO CAPS: 0.4000 mg | ORAL_CAPSULE | Freq: Every day | ORAL | 2 refills | Status: DC

## 2016-11-05 MED ORDER — TIOTROPIUM BROMIDE MONOHYDRATE 18 MCG IN CAPS: 18.0000 ug | ORAL_CAPSULE | Freq: Every day | RESPIRATORY_TRACT | 2 refills | Status: DC

## 2016-11-05 MED ORDER — PANTOPRAZOLE SODIUM 40 MG PO TBEC: 40.0000 mg | DELAYED_RELEASE_TABLET | Freq: Two times a day (BID) | ORAL | 2 refills | Status: DC

## 2016-11-05 MED ORDER — FINASTERIDE 5 MG PO TABS: 5.0000 mg | ORAL_TABLET | Freq: Every day | ORAL | 2 refills | Status: DC

## 2016-11-05 MED ORDER — FERROUS GLUCONATE 324 (38 FE) MG PO TABS: 324.0000 mg | ORAL_TABLET | Freq: Every day | ORAL | 3 refills | Status: DC

## 2016-11-05 NOTE — Progress Notes (Signed)
Transitions of Care Pharmacy Note  Plan:  Educated on inhaler technique, need for strict compliance, protonix and pred use for next few days Recommend continued treatment with protonix BID and potential addition of TUMS for rapid symptomatic relief.  Inpatient follow-up: N/A Outpatient follow-up with your psychiatrist to evaluate your current regimen.  --------------------------------------------- Francisco Beard is an 56 y.o. male who presents with a chief complaint of shortness of breath. In anticipation of discharge, pharmacy has reviewed this patient's prior to admission medication history, as well as [facility]  Current medication indications, dosing, frequency, and notable side effects reviewed with patient. patient verbalized understanding of current inpatient medication regimen and is aware that the After Visit Summary when presented, will represent the most accurate medication list at discharge.   Francisco Beard expressed concerns regarding his continued gastric pain. He continued to grab his chest wihle I was in the room. The twice daily protonix does not seem to be providing adequate relief. I would recommend TUMS for symptomatic relief. Additionally, he states he has been on prednisone for years 2/2 poorly controlled asthma. Chronic prednisone could be playing a role in the gastritis.   Finally, he says his medications are administered at his facility and they manage everything for him. As such, I doubt compliance is an issue with him.   We discussed inhaler technique for each of his new inhalers.  Assessment: Understanding of regimen: good Understanding of indications: good Potential of compliance: good Barriers to Obtaining Medications: No  Patient instructed to contact inpatient pharmacy team with further questions or concerns if needed.    Time spent preparing for discharge counseling: 10 mins Time spent counseling patient: 15 mins   Thank  you for allowing pharmacy to be a part of this patient's care.  Francisco Beard, Cleotis NipperBS, PharmD Clinical Pharmacy Resident [phone]) 11/05/2016 5:59 PM

## 2016-11-05 NOTE — Discharge Summary (Signed)
Name: Francisco Beard Elkhatib MRN: [id number] DOB: [birth year] 56 y.o. PCP: Patient, No Pcp Per  Date of Admission: 11/04/2016  8:52 AM Date of Discharge: 11/05/2016 Attending Physician: Dr. Carlynn PurlErik Hoffman  Discharge Diagnosis: 1. COPD/Asthma exacerbation Principal Problem:   Acute respiratory failure (HCC) Active Problems:   Avascular necrosis of bone of right hip (HCC)   HTN (hypertension)   Schizophrenia (HCC)   Allergic rhinitis   Tobacco use disorder   BPH (benign prostatic hyperplasia)   Discharge Medications: Allergies as of 11/05/2016      Reactions   Aleve [naproxen] Other (See Comments)   Heart problems so MD told him to not take   Aspirin Swelling   Ibuprofen Swelling   Penicillins Swelling   Has patient had a PCN reaction causing immediate rash, facial/tongue/throat swelling, SOB or lightheadedness with hypotension: Yes Has patient had a PCN reaction causing severe rash involving mucus membranes or skin necrosis: Yes Has patient had a PCN reaction that required hospitalization: Yes Has patient had a PCN reaction occurring within the last 10 years: No If all of the above answers are "NO", then may proceed with Cephalosporin use.   Seroquel [quetiapine] Other (See Comments)   In higher doses, this causes excessive sedation   Strawberry Extract Anaphylaxis   Tomato Anaphylaxis   Onion Nausea And Vomiting, Rash      Medication List    STOP taking these medications   benztropine 1 MG tablet Commonly known as:  COGENTIN   isosorbide mononitrate 30 MG 24 hr tablet Commonly known as:  IMDUR   mometasone-formoterol 200-5 MCG/ACT Aero Commonly known as:  DULERA   oxyCODONE-acetaminophen 5-325 MG tablet Commonly known as:  PERCOCET/ROXICET   polyethylene glycol packet Commonly known as:  MIRALAX / GLYCOLAX   QUEtiapine 100 MG tablet Commonly known as:  SEROQUEL     TAKE these medications   albuterol (2.5 MG/3ML) 0.083% nebulizer solution Commonly known as:   PROVENTIL Take 3 mLs (2.5 mg total) by nebulization every 4 (four) hours as needed for wheezing or shortness of breath.   albuterol 108 (90 Base) MCG/ACT inhaler Commonly known as:  PROVENTIL HFA;VENTOLIN HFA Inhale 1-2 puffs into the lungs every 4 (four) hours as needed for wheezing or shortness of breath.   amLODipine 5 MG tablet Commonly known as:  NORVASC Take 1 tablet (5 mg total) by mouth daily.   atorvastatin 20 MG tablet Commonly known as:  LIPITOR Take 20 mg by mouth daily.   budesonide-formoterol 80-4.5 MCG/ACT inhaler Commonly known as:  SYMBICORT Inhale 2 puffs into the lungs 2 (two) times daily.   cholecalciferol 1000 units tablet Commonly known as:  VITAMIN D Take 1,000 Units by mouth 2 (two) times daily. What changed:  Another medication with the same name was removed. Continue taking this medication, and follow the directions you see here.   divalproex 500 MG DR tablet Commonly known as:  DEPAKOTE Take 1 tablet (500 mg total) by mouth 2 (two) times daily.   DULoxetine 30 MG capsule Commonly known as:  CYMBALTA Take 30 mg by mouth daily.   famotidine 10 MG chewable tablet Commonly known as:  PEPCID AC Chew 10 mg by mouth 2 (two) times daily as needed for heartburn.   ferrous gluconate 324 MG tablet Commonly known as:  FERGON Take 1 tablet (324 mg total) by mouth daily with breakfast. What changed:  when to take this   finasteride 5 MG tablet Commonly known as:  PROSCAR Take 1 tablet (5  mg total) by mouth daily.   fluPHENAZine 10 MG tablet Commonly known as:  PROLIXIN Take 10-20 mg by mouth See admin instructions. Pt takes 10mg  in am, 20mg  in pm   fluticasone 50 MCG/ACT nasal spray Commonly known as:  FLONASE Place 1 spray into both nostrils daily.   folic acid 1 MG tablet Commonly known as:  FOLVITE Take 1 mg by mouth daily.   HYDROcodone-acetaminophen 5-325 MG tablet Commonly known as:  NORCO/VICODIN Take 1 tablet by mouth every 6 (six) hours  as needed for moderate pain.   pantoprazole 40 MG tablet Commonly known as:  PROTONIX Take 1 tablet (40 mg total) by mouth 2 (two) times daily.   predniSONE 20 MG tablet Commonly known as:  DELTASONE Take 2 tablets (40 mg total) by mouth daily with breakfast. What changed:  medication strength  how much to take  how to take this  when to take this  additional instructions  Another medication with the same name was removed. Continue taking this medication, and follow the directions you see here.   senna-docusate 8.6-50 MG tablet Commonly known as:  Senokot-S Take 2 tablets by mouth at bedtime.   tamsulosin 0.4 MG Caps capsule Commonly known as:  FLOMAX Take 1 capsule (0.4 mg total) by mouth daily.   theophylline 300 MG 24 hr capsule Commonly known as:  THEO-24 Take 300 mg by mouth daily.   thiamine 100 MG tablet Take 1 tablet (100 mg total) by mouth daily.   tiotropium 18 MCG inhalation capsule Commonly known as:  SPIRIVA HANDIHALER Place 1 capsule (18 mcg total) into inhaler and inhale daily.   VITAMIN C PO Take 1 tablet by mouth daily.       Disposition and follow-up:   Mr.Mouhamed Mena was discharged from [facility] in Stable condition.  At the hospital follow up visit please address:  1.   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  Patient has last week established with new PCP in [place], however could not remember name, name of practice, or address so we are unable to send documentation. He already had a f/u appointment  with him a week following discharge which he assured me he will keep.   2.  Labs / imaging needed at time of follow-up:  Consider PFTs to further evaluate his respiratory status; EGD in about 1 year  3.  Pending labs/ test needing follow-up: none  Hospital Course by problem list: Principal Problem:   Acute respiratory failure (HCC) Active Problems:   Avascular necrosis of bone of right hip (HCC)   HTN (hypertension)   Schizophrenia (HCC)   Allergic rhinitis   Tobacco use disorder   BPH (benign prostatic hyperplasia)   COPD/Asthma exacerbation: Patient presented to [facility] with increased shortness of breath with only minimal relief with his home albuterol. Patient has been hospitalized multiple times for same in recent months in area hospitals. He has allergic rhinitis and has increased exacerbations during spring/summer months due to landscaping and increased exposure. He has continued to smoke 3 packs per day for multiple years and endorses a childhood diagnosis of asthma;  he has not formally been diagnosed with COPD. He has been on maintenance therapy in the past but has been off of them for about a year for two different reasons he provided - not getting established with PCP until recently so not being able to get refills, and also concern over their risk of causing pneumonia. Patient was treated with prednisone, dulera and duonebs while inpatient and was discharged on prednisone and prescribed LAMA/LABA/ICS combo in form of Symbicort and Spiriva since he has combined COPD with asthma. There are no pulmonary function tests in Epic or CareEverywhere; please consider obtaining to help further classify his respiratory symptoms for targeted treatment.  Mild normocytic anemia Chronic nausea & vomiting Gastritis Patient with multiple admissions at area hospitals for nausea and vomiting with some hematemesis for which he has underwent 2 EGD's - most recent in May 2018 which showed gastritis likely  NSAID induced by appearance. EGD was also positive for a duodenal polyp which histologically showed gastric metaplasia but no malignant or adenomatous changes. Biopsy was negative for H pylori. Patient had a couple of episodes of nausea and vomiting. He has not been taking his recommended BID PPI due to running out and not getting a refill yet. Patient was re-started on protonix 9m BID. Per OSH, it would be prudent to repeat EGD in about 1 year for surveillance. He also has a mild normocytic anemia likely related to his untreated gastritis - it is improved from last month per records. Ferritin was normal. He was restarted on iron supplementation.   Pre-diabetes: Patient with A1c of 6.4 this admission. Consider repeat in about 1 year.  Schizophrenia Cocaine abuse: Patient with schizophrenia complicated by cocaine abuse which is consistently documented during all of his multiple admissions over the last couple of years, though patient denies. Patient is being followed by [facility] and Dr. [name] Canterbury at [facility]. We were unable on multiple attempts to contact for reconciliation of his medicines since there appears to have been changes made in the last few months. Per last d/c summary from [facility] for his gastritis and COPD exacerbation he was noted to be on Depakote 500mg  BID, Fluphenazine 10mg  qAM, 20mg  qPM, Duloxetine 30mg  daily, Trihexyphenidyl 2mg  TID which were continued while inpatient.   BPH: Patient with BPH who was followed by urology at [facility]. He states he was previously on tamsulosin and finasteride but ran out; his scripts are documented in last appt in April 2018. He was experiencing some hesitancy and incomplete emptying which were relieved by restarting tamsulosin and finasteride. He was provided with prescriptions for these. 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  Chronic hip  pain from avascular necrosis: Patient was continued on oxycodone-apap 5-325mg  q6hr PRN while inpatient.   Hypertension: Patient was continued on amlodipine 5mg  daily and refill was provided. His BP remained stable throughout admission. His home med list included IMDUR however, this was not reflected in recent admission/discharge notes so was not continued.  Discharge Vitals:   BP 125/72 (BP Location: Right Arm)   Pulse 87   Temp 98.1 F (36.7 C) (Oral)   Resp 18   Ht 5\' 11"  (1.803 m)   Wt 180 lb (81.6 kg)   SpO2 98%   BMI 25.10 kg/m   Pertinent Labs, Studies, and Procedures:  CMP Latest Ref Rng & Units 11/05/2016 11/04/2016 08/30/2016  Glucose 65 - 99 mg/dL 98 88 84  BUN 6 - 20 mg/dL 9 12 9   Creatinine 0.61 - 1.24 mg/dL 1.61 0.96 0.45  Sodium 135 - 145 mmol/L 141 143 139  Potassium 3.5 - 5.1 mmol/L 3.5 3.9 4.8  Chloride 101 - 111 mmol/L 111 115(H) 109  CO2 22 - 32 mmol/L 22 22 21(L)  Calcium 8.9 - 10.3 mg/dL 4.0(J) 8.1(X) 9.6  Total Protein 6.5 - 8.1 g/dL - 5.3(L) 7.2  Total Bilirubin 0.3 - 1.2 mg/dL - 0.6 9.1(Y)  Alkaline Phos 38 - 126 U/L - 45 63  AST 15 - 41 U/L - 13(L) 45(H)  ALT 17 - 63 U/L - 14(L) 19    CBC Latest Ref Rng & Units 11/05/2016 11/04/2016 08/30/2016  WBC 4.0 - 10.5 K/uL 8.1 5.9 5.7  Hemoglobin 13.0 - 17.0 g/dL 12.3(L) 12.7(L) 13.1  Hematocrit 39.0 - 52.0 % 37.8(L) 39.8 39.7  Platelets 150 - 400 K/uL 171 174 254   HIV screen 11/05/16: negative Ferritin 11/05/16: 75 CXR 11/04/16:  COPD with apical emphysema; no consolidation, infiltrate, pneumothorax appreciated  Discharge Instructions: Discharge Instructions    Call MD for:  difficulty breathing, headache or visual disturbances    Complete by:  As directed    Call MD for:  extreme fatigue    Complete by:  As directed    Call MD for:  hives    Complete by:  As directed    Call MD for:  persistant dizziness or light-headedness    Complete by:  As directed    Call MD for:  persistant nausea and vomiting     Complete by:  As directed    Call MD for:  redness, tenderness, or signs of infection (pain, swelling, redness, odor or green/yellow discharge around incision site)    Complete by:  As directed    Call MD for:  severe uncontrolled pain    Complete by:  As directed    Call MD for:  temperature >100.4    Complete by:  As directed    Diet - low sodium heart healthy    Complete by:  As directed    Discharge instructions    Complete by:  As directed    We saw you today for a COPD exacerbation.  -Please take prednisone 40 mg before breakfast for the next three days.   For your asthma and COPD: Use symbicort inhaler twice a day Use spiriva inhaler once a day Use albuterol as needed for shortness of breath  I have provided you with refills for your prostate medicine (tamsulosin and flomax, each once a day), as well as your blood pressure medicine (amlodipine once a day).  For your stomach upset, nausea, and vomiting, please take the acid reducer pantoprazole 40mg  twice a day about 30 minutes before meals. This is to treat your gastritis.  [facility] been [facility]; please pick them up as soon as you can.   Please keep your appointment with your new primary care provider that you have next week. Please continue taking your medicines for schizophrenia.   Increase activity slowly    Complete by:  As directed       Signed: Nyra Market, MD 11/06/2016, 5:27 PM   Pager [phone]

## 2016-11-05 NOTE — Progress Notes (Signed)
   Subjective:  Patient states his breathing is somewhat improved today compared to admission; he has had improvement with breathing treatments. He has not had further chest tightness since yesterday.  Objective:  Vital signs in last 24 hours: Vitals:   11/04/16 2106 11/05/16 0520 11/05/16 0845 11/05/16 0924  BP: 112/66 (!) 103/59  113/86  Pulse: 93 70    Resp: 18 16    Temp: 98.9 F (37.2 C) 98.3 F (36.8 C)    TempSrc: Oral Oral    SpO2: 100% 98% 97%   Weight:      Height:       Constitutional: NAD, lying in bed comfortably CV: RRR, no murmurs, rubs or gallops appreciated Resp: CTAB, no increased work of breathing, no wheezing or rales Abd: soft, NDNT, +BS  Assessment/Plan:  Principal Problem:   Acute respiratory failure (HCC) Active Problems:   Avascular necrosis of bone of right hip (HCC)   HTN (hypertension)   Schizophrenia (HCC)   Allergic rhinitis   Tobacco use disorder   BPH (benign prostatic hyperplasia)  COPD exacerbation, possible h/o Asthma: Improvement with steroids and duonebs; no wheezing this morning.  --continue prednisone 40mg  for full 5 day course --will d/c with lama, laba, ics that is covered by his insurance + albuterol  Mild normocytic anemia, N/V:  Likely in setting of untreated gastritis - patient [facility] which showed findings consistent with NSAID gastritis and hiatal hernia; duodenal polyp biopsy showed metaplasia but no adenomatous or malignant changes; gastric biopsy was negative for H pylori. Patient states he has had nausea and vomiting for months; he was on BID PPI but has run out of medicines. Ferritin is 75 this admission. --restart protonix 40mg  BID --recommend for monitoring of metaplastic changes  Pre-diabetes: Patient with A1c of 6.4 this admission.   BPH: Patient restarted on home tamsulosin and finesteride.  Hypertension: Stable on amlodipine 5mg  daily.  Dispo: Anticipated discharge possibly later today.    Nyra MarketSvalina, Jaeshaun Riva, MD 11/05/2016, 11:40 AM Pager [phone]

## 2016-11-05 NOTE — Progress Notes (Signed)
Patient discharge teaching given, including activity, diet, follow-up appoints, and medications. Patient verbalized understanding of all discharge instructions.Skin is intact except as charted in most recent assessments. Pt to be escorted out by NT, to be driven home by family.

## 2016-11-06 LAB — HIV ANTIBODY (ROUTINE TESTING W REFLEX): HIV Screen 4th Generation wRfx: NONREACTIVE

## 2016-12-09 ENCOUNTER — Encounter (HOSPITAL_COMMUNITY): Payer: Self-pay | Admitting: Emergency Medicine

## 2016-12-09 ENCOUNTER — Emergency Department (HOSPITAL_COMMUNITY)
Admission: EM | Admit: 2016-12-09 | Discharge: 2016-12-10 | Disposition: A | Discharge status: Other Acute Inpatient Hospital | Payer: Medicaid Other | Attending: Emergency Medicine | Admitting: Emergency Medicine

## 2016-12-09 ENCOUNTER — Ambulatory Visit (HOSPITAL_COMMUNITY)
Admission: RE | Admit: 2016-12-09 | Discharge: 2016-12-09 | Disposition: A | Discharge status: Home / Self Care | Payer: Medicaid Other | Attending: Psychiatry | Admitting: Psychiatry

## 2016-12-09 DIAGNOSIS — Z79899 Other long term (current) drug therapy: Secondary | ICD-10-CM | POA: Diagnosis not present

## 2016-12-09 DIAGNOSIS — F17210 Nicotine dependence, cigarettes, uncomplicated: Secondary | ICD-10-CM | POA: Diagnosis not present

## 2016-12-09 DIAGNOSIS — I110 Hypertensive heart disease with heart failure: Secondary | ICD-10-CM | POA: Insufficient documentation

## 2016-12-09 DIAGNOSIS — E119 Type 2 diabetes mellitus without complications: Secondary | ICD-10-CM | POA: Insufficient documentation

## 2016-12-09 DIAGNOSIS — R45850 Homicidal ideations: Secondary | ICD-10-CM | POA: Diagnosis not present

## 2016-12-09 DIAGNOSIS — F329 Major depressive disorder, single episode, unspecified: Secondary | ICD-10-CM | POA: Diagnosis present

## 2016-12-09 DIAGNOSIS — J449 Chronic obstructive pulmonary disease, unspecified: Secondary | ICD-10-CM | POA: Diagnosis not present

## 2016-12-09 DIAGNOSIS — F209 Schizophrenia, unspecified: Secondary | ICD-10-CM | POA: Diagnosis present

## 2016-12-09 DIAGNOSIS — R45851 Suicidal ideations: Secondary | ICD-10-CM | POA: Insufficient documentation

## 2016-12-09 DIAGNOSIS — I509 Heart failure, unspecified: Secondary | ICD-10-CM | POA: Diagnosis not present

## 2016-12-09 DIAGNOSIS — R443 Hallucinations, unspecified: Secondary | ICD-10-CM

## 2016-12-09 LAB — COMPREHENSIVE METABOLIC PANEL
ALT: 26 U/L (ref 17–63)
AST: 36 U/L (ref 15–41)
Albumin: 3.7 g/dL (ref 3.5–5.0)
Alkaline Phosphatase: 56 U/L (ref 38–126)
Anion gap: 7 (ref 5–15)
BILIRUBIN TOTAL: 0.4 mg/dL (ref 0.3–1.2)
BUN: 15 mg/dL (ref 6–20)
CHLORIDE: 113 mmol/L — AB (ref 101–111)
CO2: 23 mmol/L (ref 22–32)
Calcium: 9 mg/dL (ref 8.9–10.3)
Creatinine, Ser: 0.98 mg/dL (ref 0.61–1.24)
Glucose, Bld: 119 mg/dL — ABNORMAL HIGH (ref 65–99)
POTASSIUM: 3.6 mmol/L (ref 3.5–5.1)
Sodium: 143 mmol/L (ref 135–145)
TOTAL PROTEIN: 6.6 g/dL (ref 6.5–8.1)

## 2016-12-09 LAB — CBC
HCT: 38.6 % — ABNORMAL LOW (ref 39.0–52.0)
Hemoglobin: 12.6 g/dL — ABNORMAL LOW (ref 13.0–17.0)
MCH: 28.6 pg (ref 26.0–34.0)
MCHC: 32.6 g/dL (ref 30.0–36.0)
MCV: 87.7 fL (ref 78.0–100.0)
PLATELETS: 250 10*3/uL (ref 150–400)
RBC: 4.4 MIL/uL (ref 4.22–5.81)
RDW: 15.4 % (ref 11.5–15.5)
WBC: 5.5 10*3/uL (ref 4.0–10.5)

## 2016-12-09 LAB — RAPID URINE DRUG SCREEN, HOSP PERFORMED
Amphetamines: NOT DETECTED
BENZODIAZEPINES: NOT DETECTED
Barbiturates: NOT DETECTED
Cocaine: POSITIVE — AB
OPIATES: NOT DETECTED
Tetrahydrocannabinol: NOT DETECTED

## 2016-12-09 LAB — ACETAMINOPHEN LEVEL: Acetaminophen (Tylenol), Serum: <10 ug/mL — ABNORMAL LOW (ref 10–30)

## 2016-12-09 LAB — SALICYLATE LEVEL

## 2016-12-09 LAB — ETHANOL

## 2016-12-09 MED ORDER — DULOXETINE HCL 30 MG PO CPEP
30.0000 mg | ORAL_CAPSULE | Freq: Every day | ORAL | Status: DC
  Administered 2016-12-10: 30 mg via ORAL
  Filled 2016-12-09: qty 1

## 2016-12-09 MED ORDER — TAMSULOSIN HCL 0.4 MG PO CAPS
0.4000 mg | ORAL_CAPSULE | Freq: Every day | ORAL | Status: DC
  Administered 2016-12-10: 0.4 mg via ORAL
  Filled 2016-12-09: qty 1

## 2016-12-09 MED ORDER — SENNOSIDES-DOCUSATE SODIUM 8.6-50 MG PO TABS: 2.0000 | ORAL_TABLET | Freq: Every day | ORAL | Status: DC

## 2016-12-09 MED ORDER — AMLODIPINE BESYLATE 5 MG PO TABS
5.0000 mg | ORAL_TABLET | Freq: Every day | ORAL | Status: DC
  Administered 2016-12-10: 5 mg via ORAL
  Filled 2016-12-09: qty 1

## 2016-12-09 MED ORDER — DIVALPROEX SODIUM 500 MG PO DR TAB
500.0000 mg | DELAYED_RELEASE_TABLET | Freq: Two times a day (BID) | ORAL | Status: DC
  Administered 2016-12-10: 500 mg via ORAL
  Filled 2016-12-09: qty 1

## 2016-12-09 MED ORDER — VITAMIN B-1 100 MG PO TABS
100.0000 mg | ORAL_TABLET | Freq: Every day | ORAL | Status: DC
  Administered 2016-12-10: 100 mg via ORAL
  Filled 2016-12-09: qty 1

## 2016-12-09 MED ORDER — PANTOPRAZOLE SODIUM 40 MG PO TBEC
40.0000 mg | DELAYED_RELEASE_TABLET | Freq: Two times a day (BID) | ORAL | Status: DC
  Administered 2016-12-10: 40 mg via ORAL
  Filled 2016-12-09: qty 1
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
## 2016-12-09 NOTE — ED Notes (Signed)
Bed: [facility] Expected date:  Expected time:  Means of arrival:  Comments: 
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
## 2016-12-09 NOTE — H&P (Signed)
Behavioral Health Medical Screening Exam  Francisco AsalJohn Beard is an 56 y.o. male [facility] case manager from [facility] Team with c/o depression, homicidal ideation and passive suicidal ideations.  Total Time spent with patient: 30 minutes  Psychiatric Specialty Exam: Physical Exam  Vitals reviewed. Constitutional: He is oriented to person, place, and time. He appears well-developed and well-nourished.  HENT:  Head: Normocephalic and atraumatic.  Eyes: Pupils are equal, round, and reactive to light.  Neck: Normal range of motion.  Cardiovascular: Normal rate and regular rhythm.   Respiratory: Effort normal and breath sounds normal.  GI: Soft. Bowel sounds are normal.  Genitourinary:  Genitourinary Comments: Deferred  Musculoskeletal: Normal range of motion.  Uses a walker; bilateral hip pain   Neurological: He is alert and oriented to person, place, and time.  Skin: Skin is warm and dry.    Review of Systems  Psychiatric/Behavioral: Positive for depression and hallucinations (hearing voices). Negative for memory loss. Substance abuse: hx of  Suicidal ideas: passive ideations. The patient is not nervous/anxious and does not have insomnia.   All other systems reviewed and are negative.   Blood pressure 112/63, pulse 61, temperature 99 F (37.2 C), temperature source Oral, resp. rate 18, SpO2 98 %.There is no height or weight on file to calculate BMI.  General Appearance: Disheveled  Eye Contact:  Good  Speech:  Clear and Coherent and Normal Rate  Volume:  Normal  Mood:  Depressed and labile  Affect:  Congruent  Thought Process:  Coherent and Goal Directed  Orientation:  Full (Time, Place, and Person)  Thought Content:  WDL, Logical and Hallucinations: Auditory  Suicidal Thoughts:  passive ideations  Homicidal Thoughts:  Yes.  without intent/plan  Memory:  Immediate;   Good Recent;   Good Remote;   Fair  Judgement:  Intact  Insight:  Present   Psychomotor Activity:  Normal  Concentration: Concentration: Good and Attention Span: Good  Recall:  Good  Fund of Knowledge:Good  Language: Good  Akathisia:  Negative  Handed:  Right  AIMS (if indicated):     Assets:  Communication Skills Desire for Improvement Financial Resources/Insurance Housing Leisure Time Resilience Social Support  Sleep:       Musculoskeletal: Strength & Muscle Tone: within normal limits Gait & Station: normal, steady with walker Patient leans: N/A  Blood pressure 112/63, pulse 61, temperature 99 F (37.2 C), temperature source Oral, resp. rate 18, SpO2 98 %.  Recommendations:  Based on my evaluation the patient appears to have an emergency medical condition for which I recommend the patient be transferred to the emergency department for further evaluation.  Delila PereyraJustina A Derwood Becraft, NP 12/09/2016, 3:00 PM

## 2016-12-09 NOTE — ED Triage Notes (Signed)
[facility] for visual hallucinations of people looking like the devil and auditory hallucinations for week.  Patient has walker that he uses for gait assistance.  Patient states that has thoughts of SI with plan of taking pills or cutting himself with a knife.
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
## 2016-12-10 ENCOUNTER — Encounter (HOSPITAL_COMMUNITY): Payer: Self-pay

## 2016-12-10 ENCOUNTER — Inpatient Hospital Stay (HOSPITAL_COMMUNITY)
Admission: AD | Admit: 2016-12-10 | Discharge: 2016-12-14 | DRG: 885 | Disposition: A | Discharge status: Other Acute Inpatient Hospital | Payer: Medicaid Other | Source: Intra-hospital | Attending: Emergency Medicine | Admitting: Emergency Medicine

## 2016-12-10 DIAGNOSIS — R112 Nausea with vomiting, unspecified: Secondary | ICD-10-CM | POA: Diagnosis present

## 2016-12-10 DIAGNOSIS — Z7951 Long term (current) use of inhaled steroids: Secondary | ICD-10-CM

## 2016-12-10 DIAGNOSIS — Z880 Allergy status to penicillin: Secondary | ICD-10-CM

## 2016-12-10 DIAGNOSIS — M1990 Unspecified osteoarthritis, unspecified site: Secondary | ICD-10-CM | POA: Diagnosis present

## 2016-12-10 DIAGNOSIS — F1490 Cocaine use, unspecified, uncomplicated: Secondary | ICD-10-CM | POA: Diagnosis not present

## 2016-12-10 DIAGNOSIS — F17210 Nicotine dependence, cigarettes, uncomplicated: Secondary | ICD-10-CM | POA: Diagnosis present

## 2016-12-10 DIAGNOSIS — R1032 Left lower quadrant pain: Secondary | ICD-10-CM | POA: Diagnosis present

## 2016-12-10 DIAGNOSIS — R45850 Homicidal ideations: Secondary | ICD-10-CM

## 2016-12-10 DIAGNOSIS — F259 Schizoaffective disorder, unspecified: Secondary | ICD-10-CM | POA: Diagnosis present

## 2016-12-10 DIAGNOSIS — Z886 Allergy status to analgesic agent status: Secondary | ICD-10-CM

## 2016-12-10 DIAGNOSIS — I509 Heart failure, unspecified: Secondary | ICD-10-CM | POA: Diagnosis present

## 2016-12-10 DIAGNOSIS — J4521 Mild intermittent asthma with (acute) exacerbation: Secondary | ICD-10-CM | POA: Diagnosis present

## 2016-12-10 DIAGNOSIS — F1910 Other psychoactive substance abuse, uncomplicated: Secondary | ICD-10-CM | POA: Diagnosis present

## 2016-12-10 DIAGNOSIS — Z79899 Other long term (current) drug therapy: Secondary | ICD-10-CM

## 2016-12-10 DIAGNOSIS — F200 Paranoid schizophrenia: Secondary | ICD-10-CM

## 2016-12-10 DIAGNOSIS — E785 Hyperlipidemia, unspecified: Secondary | ICD-10-CM | POA: Diagnosis not present

## 2016-12-10 DIAGNOSIS — K219 Gastro-esophageal reflux disease without esophagitis: Secondary | ICD-10-CM | POA: Diagnosis present

## 2016-12-10 DIAGNOSIS — R5381 Other malaise: Secondary | ICD-10-CM | POA: Diagnosis not present

## 2016-12-10 DIAGNOSIS — E119 Type 2 diabetes mellitus without complications: Secondary | ICD-10-CM | POA: Diagnosis present

## 2016-12-10 DIAGNOSIS — F419 Anxiety disorder, unspecified: Secondary | ICD-10-CM | POA: Diagnosis present

## 2016-12-10 DIAGNOSIS — R197 Diarrhea, unspecified: Secondary | ICD-10-CM | POA: Diagnosis present

## 2016-12-10 DIAGNOSIS — G8929 Other chronic pain: Secondary | ICD-10-CM | POA: Diagnosis present

## 2016-12-10 DIAGNOSIS — R45851 Suicidal ideations: Secondary | ICD-10-CM | POA: Diagnosis present

## 2016-12-10 DIAGNOSIS — Z888 Allergy status to other drugs, medicaments and biological substances status: Secondary | ICD-10-CM

## 2016-12-10 DIAGNOSIS — J189 Pneumonia, unspecified organism: Secondary | ICD-10-CM | POA: Diagnosis present

## 2016-12-10 DIAGNOSIS — N400 Enlarged prostate without lower urinary tract symptoms: Secondary | ICD-10-CM | POA: Diagnosis present

## 2016-12-10 DIAGNOSIS — F39 Unspecified mood [affective] disorder: Secondary | ICD-10-CM | POA: Diagnosis not present

## 2016-12-10 DIAGNOSIS — G4700 Insomnia, unspecified: Secondary | ICD-10-CM | POA: Diagnosis not present

## 2016-12-10 DIAGNOSIS — Z9102 Food additives allergy status: Secondary | ICD-10-CM

## 2016-12-10 DIAGNOSIS — F250 Schizoaffective disorder, bipolar type: Secondary | ICD-10-CM | POA: Diagnosis not present

## 2016-12-10 DIAGNOSIS — I110 Hypertensive heart disease with heart failure: Secondary | ICD-10-CM | POA: Diagnosis present

## 2016-12-10 DIAGNOSIS — M25559 Pain in unspecified hip: Secondary | ICD-10-CM | POA: Diagnosis present

## 2016-12-10 DIAGNOSIS — F209 Schizophrenia, unspecified: Secondary | ICD-10-CM | POA: Diagnosis not present

## 2016-12-10 DIAGNOSIS — J440 Chronic obstructive pulmonary disease with acute lower respiratory infection: Secondary | ICD-10-CM | POA: Diagnosis present

## 2016-12-10 DIAGNOSIS — E7800 Pure hypercholesterolemia, unspecified: Secondary | ICD-10-CM | POA: Diagnosis present

## 2016-12-10 DIAGNOSIS — Y95 Nosocomial condition: Secondary | ICD-10-CM | POA: Diagnosis present

## 2016-12-10 DIAGNOSIS — R443 Hallucinations, unspecified: Secondary | ICD-10-CM | POA: Diagnosis not present

## 2016-12-10 DIAGNOSIS — F251 Schizoaffective disorder, depressive type: Secondary | ICD-10-CM | POA: Diagnosis not present

## 2016-12-10 DIAGNOSIS — F319 Bipolar disorder, unspecified: Secondary | ICD-10-CM | POA: Diagnosis present

## 2016-12-10 DIAGNOSIS — Z91018 Allergy to other foods: Secondary | ICD-10-CM

## 2016-12-10 DIAGNOSIS — R5383 Other fatigue: Secondary | ICD-10-CM | POA: Diagnosis not present

## 2016-12-10 MED ORDER — DULOXETINE HCL 30 MG PO CPEP
30.0000 mg | ORAL_CAPSULE | Freq: Every day | ORAL | Status: DC
  Administered 2016-12-11 – 2016-12-13 (×3): 30 mg via ORAL
  Filled 2016-12-10 (×6): qty 1

## 2016-12-10 MED ORDER — TAMSULOSIN HCL 0.4 MG PO CAPS
0.4000 mg | ORAL_CAPSULE | Freq: Every day | ORAL | Status: DC
  Administered 2016-12-11 – 2016-12-13 (×3): 0.4 mg via ORAL
  Filled 2016-12-10 (×5): qty 1

## 2016-12-10 MED ORDER — ALBUTEROL SULFATE HFA 108 (90 BASE) MCG/ACT IN AERS
1.0000 | INHALATION_SPRAY | Freq: Four times a day (QID) | RESPIRATORY_TRACT | Status: DC | PRN
  Administered 2016-12-11 – 2016-12-12 (×3): 2 via RESPIRATORY_TRACT
  Administered 2016-12-13: 1 via RESPIRATORY_TRACT

## 2016-12-10 MED ORDER — ALUM & MAG HYDROXIDE-SIMETH 200-200-20 MG/5ML PO SUSP
30.0000 mL | ORAL | Status: DC | PRN
  Administered 2016-12-11 – 2016-12-13 (×4): 30 mL via ORAL
  Filled 2016-12-10 (×4): qty 30

## 2016-12-10 MED ORDER — ALBUTEROL SULFATE HFA 108 (90 BASE) MCG/ACT IN AERS
2.0000 | INHALATION_SPRAY | RESPIRATORY_TRACT | Status: DC | PRN
  Administered 2016-12-10: 2 via RESPIRATORY_TRACT
  Filled 2016-12-10: qty 6.7

## 2016-12-10 MED ORDER — TRAZODONE HCL 50 MG PO TABS
50.0000 mg | ORAL_TABLET | Freq: Every evening | ORAL | Status: DC | PRN
  Administered 2016-12-12 – 2016-12-13 (×2): 50 mg via ORAL
  Filled 2016-12-10 (×2): qty 1

## 2016-12-10 MED ORDER — MAGNESIUM HYDROXIDE 400 MG/5ML PO SUSP
30.0000 mL | Freq: Every day | ORAL | Status: DC | PRN
  Administered 2016-12-13: 30 mL via ORAL
  Filled 2016-12-10: qty 30

## 2016-12-10 MED ORDER — ACETAMINOPHEN 325 MG PO TABS
650.0000 mg | ORAL_TABLET | Freq: Four times a day (QID) | ORAL | Status: DC | PRN
  Administered 2016-12-10 – 2016-12-13 (×7): 650 mg via ORAL
  Filled 2016-12-10 (×7): qty 2

## 2016-12-10 MED ORDER — DIVALPROEX SODIUM 500 MG PO DR TAB
500.0000 mg | DELAYED_RELEASE_TABLET | Freq: Two times a day (BID) | ORAL | Status: DC
  Administered 2016-12-10 – 2016-12-13 (×7): 500 mg via ORAL
  Filled 2016-12-10 (×12): qty 1

## 2016-12-10 NOTE — Progress Notes (Signed)
D: Pt was in the dayroom upon initial approach.  Pt presents with depressed affect and mood.  He reports he is doing "alright" and his goal is to "get off of this crack."  Pt denies SI/HI, reports AH that is "scrambled up right now" and VH of "bloody people."  Reports R hip pain of 10/10.  Pt reports he needs hip replacements.  Pt has been visible in milieu and he attended evening group.  Pt is ambulating with walker.    A: Introduced self to pt.  Actively listened to pt and offered support and encouragement. Medication administered per order.  PRN medication administered for pain.  Fall prevention techniques reviewed with pt, pt verbalized understanding.  Q15 minute safety checks maintained.  R: Pt is safe on the unit.  Pt is compliant with medications.  Pt verbally contracts for safety.  Will continue to monitor and assess.

## 2016-12-10 NOTE — Progress Notes (Signed)
Francisco Beard is a 56 year old male being admitted voluntarily to [facility] from [facility]. He came to the ED with suicidal/homicidal ideation.  He had a plan to take pills or cut wrists.  He is also reporting homicidal ideation towards his brother who he hold responsible for their mother's death. He is abusing cocaine daily.   He is currently on ACTT team-[facility].  He is currently using a walker due to needing a hip replacement due to long term prednisone therapy.  He also reported that he is taking an antibiotic (doxycycline 100mg  bid for syphilis).  [facility]  He continues to report some suicidal/homicidal ideation and he is able to contract for safety on the unit.  He is diagnosed with Schizoaffective Disorder and Cocaine use disorder.  Oriented him to the unit.  Admission paperwork completed and signed.  Belongings searched and secured in locker # 22.  Skin assessment completed and noted dry skin on both feet and toe nails long and discolored.  Q 15 minute checks initiated for safety.  We will monitor the progress towards his goals.

## 2016-12-10 NOTE — BH Assessment (Signed)
[facility] Assessment Progress Note  Per Thedore MinsMojeed Akintayo, MD, this pt requires psychiatric hospitalization at this time.  Berneice Heinrichina Tate, RN, [facility]  Pt has signed Voluntary Admission and Consent for Treatment, as well as Consent to Release Information to the [facility] ACT Team, and signed [facility].  Pt's nurse has been notified, and agrees to send original paperwork along with pt via [facility], and to call report to [phone].  Doylene Canninghomas Syrai Gladwin, MA Triage Specialist [phone]

## 2016-12-10 NOTE — Progress Notes (Signed)
Adult Psychoeducational Group Note  Date:  12/10/2016 Time:  10:33 PM  Group Topic/Focus:  Wrap-Up Group:   The focus of this group is to help patients review their daily goal of treatment and discuss progress on daily workbooks.  Participation Level:  Active  Participation Quality:  Appropriate, Attentive and Sharing  Affect:  Appropriate  Cognitive:  Appropriate  Insight: Appropriate  Engagement in Group:  Developing/Improving and Engaged  Modes of Intervention:  Discussion, Socialization and Support  Additional Comments:  Pt shared in group he would like to work on "not smoking." Pt stated also "getting the voices out of his head." Stated he is looking forward to treatment and would like to go to a long term facility when he discharges. Encouraged pt to speak with social worker about his discharge plans.  Karleen HampshireFox, Mishael Haran Brittini 12/10/2016, 10:33 PM

## 2016-12-10 NOTE — Tx Team (Signed)
Initial Treatment Plan 12/10/2016 9:50 PM Leanord AsalJohn Coster [id number]    PATIENT STRESSORS: Financial difficulties Health problems Substance abuse   PATIENT STRENGTHS: Communication skills General fund of knowledge Motivation for treatment/growth   PATIENT IDENTIFIED PROBLEMS: Depression  Suicidal ideation  Homicidal ideation  Psychosis  "Quit hearing voices"  "Get off the drugs"           DISCHARGE CRITERIA:  Improved stabilization in mood, thinking, and/or behavior Verbal commitment to aftercare and medication compliance  PRELIMINARY DISCHARGE PLAN: Outpatient therapy Medication management  PATIENT/FAMILY INVOLVEMENT: This treatment plan has been presented to and reviewed with the patient, Leanord AsalJohn Gilvin.  The patient and family have been given the opportunity to ask questions and make suggestions.  Levin BaconHeather V Jorje Vanatta, RN 12/10/2016, 9:50 PM

## 2016-12-10 NOTE — ED Notes (Signed)
Called [facility] for transport.  
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
## 2016-12-11 DIAGNOSIS — F251 Schizoaffective disorder, depressive type: Secondary | ICD-10-CM

## 2016-12-11 DIAGNOSIS — R5383 Other fatigue: Secondary | ICD-10-CM

## 2016-12-11 DIAGNOSIS — R443 Hallucinations, unspecified: Secondary | ICD-10-CM

## 2016-12-11 DIAGNOSIS — R5381 Other malaise: Secondary | ICD-10-CM

## 2016-12-11 DIAGNOSIS — R45850 Homicidal ideations: Secondary | ICD-10-CM

## 2016-12-11 MED ORDER — DOXYCYCLINE HYCLATE 100 MG PO TABS
100.0000 mg | ORAL_TABLET | Freq: Two times a day (BID) | ORAL | Status: DC
  Administered 2016-12-11 – 2016-12-13 (×5): 100 mg via ORAL
  Filled 2016-12-11 (×9): qty 1

## 2016-12-11 NOTE — Plan of Care (Signed)
Problem: Activity: Goal: Sleeping patterns will improve Outcome: Progressing Slept 6.5 hours last night per flowsheet.

## 2016-12-11 NOTE — Progress Notes (Signed)
D:Pt c/o of a headache this morning. He had been given tylenol earlier this morning. Pt said that he is not getting the pain medications that he wants and said that he can't take tylenol even though he took it earlier. Writer asked pt what happens when he takes Tylenol and he said that he swells. No swelling observed. Reported to MD and will continue to monitor. Pt asked about an antibiotic that he has been taking for a week for syphilis. Reported to MD for medication evaluation. Pt reports passive si thoughts. A:Offered support, encouragement and 15 minute checks. Gave aromatherapy for headache.  R:Pt contracts with staff for safety. Safety maintained on the unit.

## 2016-12-11 NOTE — H&P (Signed)
Psychiatric Admission Assessment Adult  Patient Identification: Francisco Beard MRN:  [id number] Date of Evaluation:  12/11/2016 Chief Complaint:  Schizophrenia Cocaine Use Disorder Principal Diagnosis: Schizoaffective disorder (HCC) Diagnosis:   Patient Active Problem List   Diagnosis Date Noted  . Schizoaffective disorder (HCC) [F25.9] 12/10/2016    Priority: High  . Polysubstance abuse [F19.10] 07/09/2016    Priority: High  . Acute respiratory failure (HCC) [J96.00] 11/04/2016  . Allergic rhinitis [J30.9] 11/04/2016  . Tobacco use disorder [F17.200] 11/04/2016  . BPH (benign prostatic hyperplasia) [N40.0] 11/04/2016  . Acute exacerbation of COPD with asthma (HCC) [J44.1, J45.901] 07/09/2016  . Homicidal ideation [R45.850]   . HTN (hypertension) [I10] 04/17/2016  . Chest pain [R07.9]   . COPD exacerbation (HCC) [J44.1] 04/13/2016  . COPD (chronic obstructive pulmonary disease) (HCC) [J44.9] 04/13/2016  . Avascular necrosis of bone of right hip (HCC) [M87.051] 04/08/2016  . COPD with acute exacerbation (HCC) [J44.1] 04/01/2016  . Suicidal ideations [R45.851] 08/17/2013   History of Present Illness: Patient is a 56 year old male brought [facility] behavioral [facility] accompanied by in [facility] act team member as patient was endorsing auditory and visual hallucinations. Patient stated that the voices told him to hurt people, adds that he is upset with his brother wants to kill him as he holds them responsible for his depth. On being asked to elaborate, patient states that he is just mad, frustrated, wants to hurt people and felt he needed to come to the hospital.  Patient reports that he's been having these thoughts for a few weeks now but they're worsened over the last 2-3 days. He states that he uses crack cocaine daily and used it up to 3 days ago. He adds that he was hospitalized at [facility] on the psychiatric unit in March of this year as he attempted to overdose  on his medications.  Patient states that he does not enjoy activities, has been self isolating, has been hearing voices telling him to kill people, reports that he has visual hallucinations at times especially when he uses crack cocaine.  Patient states that he's been irritable, is frustrated with his life. He denies any feelings of hopelessness, worthlessness or guilt. He denies any racing thoughts. Associated Signs/Symptoms: Depression Symptoms:  psychomotor agitation, disturbed sleep, (Hypo) Manic Symptoms:  Distractibility, Hallucinations, Irritable Mood, Anxiety Symptoms:  Excessive Worry, Psychotic Symptoms:  Delusions, Hallucinations: Command:  Voices doing him to kill his brother, patient also reports visual hallucinations PTSD Symptoms: NA Total Time spent with patient: 1 hour  Past Psychiatric History:   Is the patient at risk to self? No.  Has the patient been a risk to self in the past 6 months? Yes.    Has the patient been a risk to self within the distant past? Yes.    Is the patient a risk to others? Yes.    Has the patient been a risk to others in the past 6 months? Yes.    Has the patient been a risk to others within the distant past? Yes.     Prior Inpatient Therapy:   Prior Outpatient Therapy:    Alcohol Screening: 1. How often do you have a drink containing alcohol?: Never 9. Have you or someone else been injured as a result of your drinking?: No 10. Has a relative or friend or a doctor or another health worker been concerned about your drinking or suggested you cut down?: No Alcohol Use Disorder Identification Test Final Score (AUDIT):  0 Brief Intervention: AUDIT score less than 7 or less-screening does not suggest unhealthy drinking-brief intervention not indicated Substance Abuse History in the last 12 months:  Yes.   Consequences of Substance Abuse: NA Previous Psychotropic Medications: Yes  Psychological Evaluations: No  Past Medical History:  Past  Medical History:  Diagnosis Date  . Anxiety   . Arthritis    "hips; bottom part of my back" (07/09/2016)  . Asthmatic bronchitis   . Bipolar disorder (HCC)   . CHF (congestive heart failure) (HCC)   . Chronic bronchitis (HCC)   . Chronic hip pain   . COPD (chronic obstructive pulmonary disease) (HCC)   . Depression   . GERD (gastroesophageal reflux disease)   . High cholesterol   . History of hiatal hernia   . Hypertension   . Irregular heart beats   . Pneumonia    "3 times; it's been awhile" (07/09/2016)  . Schizophrenia (HCC)   . Type II diabetes mellitus (HCC)     Past Surgical History:  Procedure Laterality Date  . CLOSED REDUCTION HAND FRACTURE Right    "got 6 screws and a plate in there"  . ELBOW FRACTURE SURGERY Left    "put it through the wall"  . FRACTURE SURGERY     Family History:  Family History  Problem Relation Age of Onset  . Asthma Mother   . Heart murmur Sister   . Heart murmur Brother    Family Psychiatric  History:  Tobacco Screening: Have you used any form of tobacco in the last 30 days? (Cigarettes, Smokeless Tobacco, Cigars, and/or Pipes): Yes Tobacco use, Select all that apply: 5 or more cigarettes per day Are you interested in Tobacco Cessation Medications?: No, patient refused Counseled patient on smoking cessation including recognizing danger situations, developing coping skills and basic information about quitting provided: Refused/Declined practical counseling Social History:  History  Alcohol Use  . 12.0 oz/week  . 20 Standard drinks or equivalent per week    Comment: 07/09/2016 "I had quit for 1 yr, relapsed 3 days ago"     History  Drug Use  . Types: "Crack" cocaine, Cocaine    Comment: 07/09/2016 "I had quit for 1 yr, relapsed 3 days ago"    Additional Social History: Marital status: Divorced Divorced, when?: 1990 What types of issues is patient dealing with in the relationship?: no contact Does patient have children?: No    Pain  Medications: see pta meds list - pt denies abuse Prescriptions: see pta meds list - pt denises abuse  Over the Counter: see pta meds list - pt denies abuse History of alcohol / drug use?: Yes Longest period of sobriety (when/how long): 5 yrs Negative Consequences of Use: Financial, Legal, Personal relationships, Work / School Name of Substance 1: crack cocaine 1 - Age of First Use: 30 1 - Amount (size/oz): 5 grams 1 - Frequency: daily 1 - Last Use / Amount: 12/04/16 -                   Allergies:   Allergies  Allergen Reactions  . Aleve [Naproxen] Other (See Comments)    Heart problems so MD told him to not take  . Aspirin Swelling  . Ibuprofen Swelling  . Penicillins Swelling    Has patient had a PCN reaction causing immediate rash, facial/tongue/throat swelling, SOB or lightheadedness with hypotension: Yes Has patient had a PCN reaction causing severe rash involving mucus membranes or skin necrosis: Yes Has patient had  a PCN reaction that required hospitalization: Yes Has patient had a PCN reaction occurring within the last 10 years: No If all of the above answers are "NO", then may proceed with Cephalosporin use.   . Seroquel [Quetiapine] Other (See Comments)    In higher doses, this causes excessive sedation  . Strawberry Extract Anaphylaxis  . Tomato Anaphylaxis  . Onion Nausea And Vomiting and Rash   Lab Results: No results found for this or any previous visit (from the past 48 hour(s)).  Blood Alcohol level:  Lab Results  Component Value Date   ETH <5 12/09/2016   ETH <5 08/30/2016    Metabolic Disorder Labs:  Lab Results  Component Value Date   HGBA1C 6.4 (H) 11/04/2016   MPG 137 11/04/2016   Lab Results  Component Value Date   PROLACTIN 53.1 (H) 07/18/2016   Lab Results  Component Value Date   CHOL 195 07/18/2016   TRIG 95 07/18/2016   HDL 65 07/18/2016   CHOLHDL 3.0 07/18/2016   VLDL 19 07/18/2016   LDLCALC 111 (H) 07/18/2016    Current  Medications: Current Facility-Administered Medications  Medication Dose Route Frequency Provider Last Rate Last Dose  . acetaminophen (TYLENOL) tablet 650 mg  650 mg Oral Q6H PRN Beau Fanny, FNP   650 mg at 12/11/16 0620  . albuterol (PROVENTIL HFA;VENTOLIN HFA) 108 (90 Base) MCG/ACT inhaler 1-2 puff  1-2 puff Inhalation Q6H PRN Beau Fanny, FNP   2 puff at 12/11/16 1022  . alum & mag hydroxide-simeth (MAALOX/MYLANTA) 200-200-20 MG/5ML suspension 30 mL  30 mL Oral Q4H PRN Withrow, Alfonza C, FNP      . divalproex (DEPAKOTE) DR tablet 500 mg  500 mg Oral Q12H Withrow, Zayvien C, FNP   500 mg at 12/11/16 0752  . doxycycline (VIBRA-TABS) tablet 100 mg  100 mg Oral Q12H Nelly Rout, MD      . DULoxetine (CYMBALTA) DR capsule 30 mg  30 mg Oral Daily Beau Fanny, FNP   30 mg at 12/11/16 0752  . magnesium hydroxide (MILK OF MAGNESIA) suspension 30 mL  30 mL Oral Daily PRN Withrow, Abad C, FNP      . tamsulosin (FLOMAX) capsule 0.4 mg  0.4 mg Oral Daily Withrow, Mack C, FNP   0.4 mg at 12/11/16 0752  . traZODone (DESYREL) tablet 50 mg  50 mg Oral QHS PRN Withrow, Everardo All, FNP       PTA Medications: Prescriptions Prior to Admission  Medication Sig Dispense Refill Last Dose  . albuterol (PROVENTIL HFA;VENTOLIN HFA) 108 (90 Base) MCG/ACT inhaler Inhale 1-2 puffs into the lungs every 4 (four) hours as needed for wheezing or shortness of breath. 18 g 2 12/09/2016 at Unknown time  . albuterol (PROVENTIL) (2.5 MG/3ML) 0.083% nebulizer solution Take 3 mLs (2.5 mg total) by nebulization every 4 (four) hours as needed for wheezing or shortness of breath. 10 vial 0 unknown  . amLODipine (NORVASC) 5 MG tablet Take 1 tablet (5 mg total) by mouth daily. 90 tablet 0 12/08/2016 at Unknown time  . Ascorbic Acid (VITAMIN C PO) Take 1 tablet by mouth daily.   12/09/2016 at Unknown time  . atorvastatin (LIPITOR) 20 MG tablet Take 20 mg by mouth daily.   12/08/2016 at Unknown time  . budesonide-formoterol (SYMBICORT)  80-4.5 MCG/ACT inhaler Inhale 2 puffs into the lungs 2 (two) times daily. 1 Inhaler 12 Past Week at Unknown time  . cholecalciferol (VITAMIN D) 1000 units tablet Take 1,000  Units by mouth 2 (two) times daily.   12/08/2016 at Unknown time  . divalproex (DEPAKOTE) 500 MG DR tablet Take 1 tablet (500 mg total) by mouth 2 (two) times daily. (Patient not taking: Reported on 12/09/2016) 60 tablet 0 Not Taking at Unknown time  . divalproex (DEPAKOTE) 500 MG DR tablet Take 500 mg by mouth 2 (two) times daily.    12/08/2016 at 2000  . DULoxetine (CYMBALTA) 30 MG capsule Take 30 mg by mouth daily.   Past Week at Unknown time  . famotidine (PEPCID AC) 10 MG chewable tablet Chew 10 mg by mouth 2 (two) times daily as needed for heartburn.   Past Week at Unknown time  . ferrous gluconate (FERGON) 324 MG tablet Take 1 tablet (324 mg total) by mouth daily with breakfast. 90 tablet 3 Past Week at Unknown time  . finasteride (PROSCAR) 5 MG tablet Take 1 tablet (5 mg total) by mouth daily. 30 tablet 2 Past Week at Unknown time  . fluPHENAZine (PROLIXIN) 10 MG tablet Take 10-20 mg by mouth See admin instructions. Pt takes 10mg  in am, 20mg  in pm   Past Week at Unknown time  . fluticasone (FLONASE) 50 MCG/ACT nasal spray Place 1 spray into both nostrils daily. 16 g 2 12/09/2016 at Unknown time  . folic acid (FOLVITE) 1 MG tablet Take 1 mg by mouth daily.   Past Week at Unknown time  . HYDROcodone-acetaminophen (NORCO/VICODIN) 5-325 MG tablet Take 1 tablet by mouth every 6 (six) hours as needed for moderate pain. (Patient not taking: Reported on 12/09/2016) 20 tablet 0 Not Taking at Unknown time  . pantoprazole (PROTONIX) 40 MG tablet Take 1 tablet (40 mg total) by mouth 2 (two) times daily. 60 tablet 2 12/08/2016 at Unknown time  . tamsulosin (FLOMAX) 0.4 MG CAPS capsule Take 1 capsule (0.4 mg total) by mouth daily. 30 capsule 2 12/09/2016 at Unknown time  . theophylline (THEO-24) 300 MG 24 hr capsule Take 300 mg by mouth daily.   Past  Week at Unknown time  . thiamine 100 MG tablet Take 1 tablet (100 mg total) by mouth daily. 30 tablet 0 12/09/2016 at Unknown time  . tiotropium (SPIRIVA HANDIHALER) 18 MCG inhalation capsule Place 1 capsule (18 mcg total) into inhaler and inhale daily. 60 capsule 2 12/09/2016 at Unknown time    Musculoskeletal: Strength & Muscle Tone: within normal limits Gait & Station: normal Patient leans: N/A  Psychiatric Specialty Exam: Physical Exam  Review of Systems  Constitutional: Positive for malaise/fatigue. Negative for fever and weight loss.  HENT: Negative for congestion, hearing loss and sore throat.   Gastrointestinal: Negative.  Negative for heartburn, nausea and vomiting.  Musculoskeletal: Positive for back pain, joint pain and myalgias. Negative for falls.       Patient uses a walker as he has avascular necrosis of the femur  Neurological: Negative for dizziness, focal weakness, seizures, loss of consciousness and weakness.  Endo/Heme/Allergies: Negative for environmental allergies.  Psychiatric/Behavioral: Positive for depression, hallucinations and substance abuse. Negative for suicidal ideas. The patient is nervous/anxious.     Blood pressure 114/77, pulse 84, temperature 98.7 F (37.1 C), resp. rate 16, height 5\' 11"  (1.803 m), weight 81.6 kg (180 lb), SpO2 98 %.Body mass index is 25.1 kg/m.  General Appearance: Disheveled  Eye Contact:  Fair  Speech:  Clear and Coherent and Normal Rate  Volume:  Increased  Mood:  Angry and Irritable  Affect:  Congruent and Labile  Thought Process:  Coherent and Descriptions of Associations: Circumstantial  Orientation:  Full (Time, Place, and Person)  Thought Content:  Delusions, Hallucinations: Command:  Telling him to kill his brother and people and Rumination  Suicidal Thoughts:  No  Homicidal Thoughts:  Yes.  without intent/plan  Memory:  Immediate;   Fair Recent;   Fair Remote;   Fair  Judgement:  Impaired  Insight:  Lacking   Psychomotor Activity:  Mannerisms  Concentration:  Concentration: Fair and Attention Span: Fair  Recall:  FiservFair  Fund of Knowledge:  Fair  Language:  Fair  Akathisia:  No  Handed:  Right  AIMS (if indicated):     Assets:  Desire for Improvement Social Support  ADL's:  Impaired  Cognition:  WNL  Sleep:  Number of Hours: 6.5    Treatment Plan Summary: Daily contact with patient to assess and evaluate symptoms and progress in treatment and Medication management  Observation Level/Precautions:  15 minute checks  Laboratory:  To review labs done in the ED  Psychotherapy: Patient to attend groups   Medications:  Continue Cymbalta 30 mg daily for depressive symptoms Continue Depakote ER 500 mg twice daily for mood stabilization Start trazodone 50 mg one pill at bedtime as needed for sleep   Consultations:  Patient continued on his doxycycline as he is diagnosed with syphilis 100 mg twice a day   Discharge Concerns:  Patient is safely and effectively participate in outpatient treatment   Estimated LOS:5-7 days   Other:     Physician Treatment Plan for Primary Diagnosis: Schizoaffective disorder (HCC) Long Term Goal(s): Improvement in symptoms so as ready for discharge  Short Term Goals: Ability to verbalize feelings will improve, Ability to demonstrate self-control will improve, Ability to identify and develop effective coping behaviors will improve and Ability to identify triggers associated with substance abuse/mental health issues will improve  Physician Treatment Plan for Secondary Diagnosis: Principal Problem:   Schizoaffective disorder (HCC) Active Problems:   Polysubstance abuse  Long Term Goal(s): Improvement in symptoms so as ready for discharge  Short Term Goals: Ability to demonstrate self-control will improve, Ability to identify and develop effective coping behaviors will improve and Ability to identify triggers associated with substance abuse/mental health issues will  improve  I certify that inpatient services furnished can reasonably be expected to improve the patient's condition.    Nelly RoutKUMAR,Abdulraheem Pineo, MD 7/11/20185:47 PM

## 2016-12-11 NOTE — Progress Notes (Signed)
Recreation Therapy Notes  INPATIENT RECREATION THERAPY ASSESSMENT  Patient Details Name: Francisco Beard MRN: [id number] DOB: 26-Oct-1960 Today's Date: 12/11/2016  Patient Stressors: Family, Other (Comment) (Living arrangements)  Pt stated he was here for hearing voices, seeing things, wanting to hurt people and doing drugs.  Coping Skills:   Isolate, Arguments, Substance Abuse, Avoidance, Self-Injury, Art/Dance, Talking  Personal Challenges: Anger, Communication, Concentration, Decision-Making, Self-Esteem/Confidence, Trusting Others, Time Management, Substance Abuse, Stress Management  Leisure Interests (2+):  Art - Draw, Individual - Other (Comment) (Cooking)  Awareness of Community Resources:  Yes  Community Resources:  Park  Current Use: No  If no, Barriers?: Other (Comment) (To many prostitutes)  Patient Strengths:  Like joking/having fun; Lifting others spirits  Patient Identified Areas of Improvement:  Temper; anger  Current Recreation Participation:  Everyday but mostly on Saturday and Sunday  Patient Goal for Hospitalization:  "Getting off drugs and work on anger"  [place]  [place]  Current [place] (including self-harm):  No  Current HI:  No  Consent to Intern Participation: N/A   Caroll RancherMarjette Matt Delpizzo, LRT/CTRS  Caroll RancherLindsay, Arianah Torgeson A 12/11/2016, 11:31 AM
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
## 2016-12-11 NOTE — [facility] Suicide Risk Assessment (Signed)
[facility] Admission Suicide Risk Assessment   Nursing information obtained from:  Patient Demographic factors:  Male, Low socioeconomic status, Living alone Current Mental Status:  Suicidal ideation indicated by patient Loss Factors:  Loss of significant relationship, Financial problems / change in socioeconomic status Historical Factors:  Prior suicide attempts, Impulsivity, Family history of mental illness or substance abuse Risk Reduction Factors:  NA  Total Time spent with patient: 1 hour Principal Problem: Schizoaffective disorder (HCC) Diagnosis:   Patient Active Problem List   Diagnosis Date Noted  . Paranoid schizophrenia (HCC) [F20.0] 12/10/2016  . Acute respiratory failure (HCC) [J96.00] 11/04/2016  . Allergic rhinitis [J30.9] 11/04/2016  . Tobacco use disorder [F17.200] 11/04/2016  . BPH (benign prostatic hyperplasia) [N40.0] 11/04/2016  . Acute exacerbation of COPD with asthma (HCC) [J44.1, J45.901] 07/09/2016  . Polysubstance abuse [F19.10] 07/09/2016  . Homicidal ideation [R45.850]   . Bipolar disorder (HCC) [F31.9] 04/17/2016  . HTN (hypertension) [I10] 04/17/2016  . Chest pain [R07.9]   . COPD exacerbation (HCC) [J44.1] 04/13/2016  . COPD (chronic obstructive pulmonary disease) (HCC) [J44.9] 04/13/2016  . Avascular necrosis of bone of right hip (HCC) [M87.051] 04/08/2016  . COPD with acute exacerbation (HCC) [J44.1] 04/01/2016  . Suicidal ideations [R45.851] 08/17/2013   Subjective Data: Patient is a 56 year old male admitted for command hallucinations, voices telling him to kill his brother and other people. Patient was noted to be agitated, has been self isolating, does not seem to enjoy things that he was doing in the past.  Continued Clinical Symptoms:  Alcohol Use Disorder Identification Test Final Score (AUDIT): 0 The "Alcohol Use Disorders Identification Test", Guidelines for Use in Primary Care, Second Edition.  World Science writer [facility]). Score between  0-7:  no or low risk or alcohol related problems. Score between 8-15:  moderate risk of alcohol related problems. Score between 16-19:  high risk of alcohol related problems. Score 20 or above:  warrants further diagnostic evaluation for alcohol dependence and treatment.   CLINICAL FACTORS:   Severe Anxiety and/or Agitation Alcohol/Substance Abuse/Dependencies Schizophrenia:   Command hallucinatons Previous Psychiatric Diagnoses and Treatments Medical Diagnoses and Treatments/Surgeries   Musculoskeletal: Strength & Muscle Tone: within normal limits Gait & Station: unsteady Patient leans: N/A  Psychiatric Specialty Exam: Physical Exam  ROS  Blood pressure 114/77, pulse 84, temperature 98.7 F (37.1 C), resp. rate 16, height 5\' 11"  (1.803 m), weight 81.6 kg (180 lb), SpO2 98 %.Body mass index is 25.1 kg/m.  General Appearance: Disheveled  Eye Contact:  Fair  Speech:  Clear and Coherent and Normal Rate  Volume:  Increased  Mood:  Angry and Irritable  Affect:  Congruent and Labile  Thought Process:  Coherent and Descriptions of Associations: Circumstantial  Orientation:  Full (Time, Place, and Person)  Thought Content:  Delusions, Hallucinations: Command:  Voices telling him to kill his brother and Rumination  Suicidal Thoughts:  No  Homicidal Thoughts:  Yes.  without intent/plan  Memory:  Immediate;   Fair Recent;   Fair Remote;   Fair  Judgement:  Poor  Insight:  Lacking  Psychomotor Activity:  Mannerisms  Concentration:  Concentration: Fair and Attention Span: Fair  Recall:  [employer] of Knowledge:  Fair  Language:  Fair  Akathisia:  No  Handed:  Right  AIMS (if indicated):     Assets:  Desire for Improvement  ADL's:  Impaired  Cognition:  WNL  Sleep:  Number of Hours: 6.5      COGNITIVE FEATURES THAT CONTRIBUTE  TO RISK:  Closed-mindedness and Thought constriction (tunnel vision)    SUICIDE RISK:   Minimal: No identifiable suicidal ideation.  Patients  presenting with no risk factors but with morbid ruminations; may be classified as minimal risk based on the severity of the depressive symptoms  PLAN OF CARE: Patient admitted to inpatient unit, will stabilize patient in regards to his psychotic symptoms, patient also needs substance use treatment as being he's been abusing cocaine. Patient has multiple medical problems and is currently on doxycycline for syphilis  I certify that inpatient services furnished can reasonably be expected to improve the patient's condition.   Francisco RoutKUMAR,Idrees Quam, MD 12/11/2016, 5:03 PM

## 2016-12-11 NOTE — Plan of Care (Signed)
Problem: Medication: Goal: Compliance with prescribed medication regimen will improve Outcome: Progressing Pt is taking medications as prescribed.   

## 2016-12-11 NOTE — Progress Notes (Signed)
Recreation Therapy Notes  Date: 07.11.2018 Time: 10:00am Location: [address] Dayroom  Group Topic: Positivity and Motivation  Goal Area(s) Addresses:  Patient will be able to identify thoughts and ideas that are motivational to them.  Intervention: Art  Activity: Pt will be identifying a positive quote or phrase that motivates them. Pt will use printer paper, magazines, markers, colored pencils, scissors and glue to create a motivational poster. Pt will also use pictures from magazines to represent things that motivate them.  Education: Positivity, Motivation, Discharge Planning  Education Outcome: Needs additional education   Clinical Observations/Feedback: Pt did not attend group.  Rachel Meyer, Recreation Therapy Intern  Francisco Beard, LRT/CTRS  

## 2016-12-11 NOTE — [facility] Group Notes (Signed)
[facility] LCSW Group Therapy 12/11/2016 1:15 PM  Type of Therapy: Group Therapy- Emotion Regulation  Participation Level: Reserved  Participation Quality:  Attentive  Affect: Flat  Cognitive: Alert and Oriented   Insight:  Unable to assess  Engagement in Therapy: Limited  Modes of Intervention: Clarification, Confrontation, Discussion, Education, Exploration, Limit-setting, Orientation, Problem-solving, Rapport Building, Dance movement psychotherapisteality Testing, Socialization and Support  Summary of Progress/Problems: The topic for group today was emotional regulation. This group focused on both positive and negative emotion identification and allowed group members to process ways to identify feelings, regulate negative emotions, and find healthy ways to manage internal/external emotions. Group members were asked to reflect on a time when their reaction to an emotion led to a negative outcome and explored how alternative responses using emotion regulation would have benefited them. Group members were also asked to discuss a time when emotion regulation was utilized when a negative emotion was experienced. Pt did not participate in group discussion but appeared to be attentive throughout.    Vernie ShanksLauren Keilany Burnette, LCSW 12/11/2016 3:18 PM

## 2016-12-11 NOTE — Progress Notes (Signed)
D: Pt was in the dayroom upon initial approach.  Pt presents with depressed affect and mood.  His goal is "not to be smoking crack."  Pt reports best part of his day was "listening to the group."  Pt denies SI, denies hallucinations at this time, reports R hip pain of 10/10.  Reports he was having command AH earlier today "to hang myself."  Pt reports HI earlier today to "my brother and the lady that lives at my house."  Pt denies plan.  [facility]  Pt has been visible in milieu interacting with peers and staff appropriately.  Pt attended evening group.  Pt continues to ambulate with his walker.   A: Introduced self to pt.  Actively listened to pt and offered support and encouragement. Medications administered per order.  PRN medication administered for pain and indigestion.  Q15 minute safety checks maintained.  R: Pt is safe on the unit.  Pt is compliant with medications.  Pt verbally contracts for safety.  Will continue to monitor and assess.

## 2016-12-11 NOTE — [facility] Suicide Risk Assessment (Signed)
[facility] INPATIENT:  Family/Significant Other Suicide Prevention Education  Suicide Prevention Education:  Patient Refusal for Family/Significant Other Suicide Prevention Education: The patient Francisco Beard has refused to provide written consent for family/significant other to be provided Family/Significant Other Suicide Prevention Education during admission and/or prior to discharge.  Physician notified.  Verdene LennertLauren C Eliah Ozawa 12/11/2016, 1:02 PM

## 2016-12-11 NOTE — Social Work (Signed)
Referred to [facility], is [place] resident.  Jasmynn Pfalzgraf, LCSW Lead Clinical Social Worker Phone:  [phone]  

## 2016-12-12 DIAGNOSIS — F259 Schizoaffective disorder, unspecified: Principal | ICD-10-CM

## 2016-12-12 LAB — LIPID PANEL
CHOL/HDL RATIO: 5.2 ratio
CHOLESTEROL: 220 mg/dL — AB (ref 0–200)
HDL: 42 mg/dL (ref >40)
LDL Cholesterol: 163 mg/dL — ABNORMAL HIGH (ref 0–99)
Triglycerides: 76 mg/dL (ref <150)
VLDL: 15 mg/dL (ref 0–40)

## 2016-12-12 LAB — TSH: TSH: 2.305 u[IU]/mL (ref 0.350–4.500)

## 2016-12-12 LAB — VALPROIC ACID LEVEL: VALPROIC ACID LVL: 47 ug/mL — AB (ref 50.0–100.0)

## 2016-12-12 NOTE — Plan of Care (Signed)
Problem: Activity: Goal: Interest or engagement in activities will improve Outcome: Progressing Pt is out in the dayroom interacting with peers and staff. He is attending and participating in groups.

## 2016-12-12 NOTE — Progress Notes (Signed)
D:Pt reports anger and hi towards his brother that he says killed his mother. Pt does not go into detail as to how or why he thinks this. Pt says that he has passive thoughts and c/o hip pain. A:Offered support and 15 minute checks. Gave medications as ordered.  R:Pt contracts with staff for safety. Safety maintained on the unit.

## 2016-12-12 NOTE — Social Work (Addendum)
Casimiro NeedleMichael (team lead for ACT team at [facility]) (cell [phone]) contacted.  CSW left VM requesting call back and stating that inpatient treatment team is requesting that ACT team increase frequency of visits.  Per NP, patient is requesting additional visits and feels he needs more support.  CSW spoke w team lead Casimiro NeedleMichael - states patient 3 -5 time/week in the community prior to hospitalization.  Has seen counselor, peer support counselor, substance abuse counselor - team now has psychiatrist who will see patient in home every 4 - 6 weeks.  ACT team will send Amada JupiterLarry Thorkin, [facility]stance Abuse counselor, [facility] - Thorkin will call CSW [place] prior to visit.    Santa GeneraAnne Ivannia Willhelm, LCSW Lead Clinical Social Worker Phone:  [phone]

## 2016-12-12 NOTE — Progress Notes (Signed)
Recreation Therapy Notes  Date: 12/12/2016 Time: 10:00am Location: [address] Dayroom  Group Topic: Leisure Education and Goal Setting  Goal Area(s) Addresses:  Pt will be able to successfully identify 10 things they would like to accomplish during their lifetime. Patients will be able to successfully identify 2 ways they can reach their goals.  Intervention: Worksheet  Activity: Pt was asked to create a bucket list of things they would like to accomplish during their lifetime. Pt was then asked to pick their top two things and create a goal for each on how they were going to accomplish them.  Education: Leisure Education, Goal Setting, Discharge Planning  Education Outcome: Needs additional education  Clinical Observations/Feedback: Pt did not attend group.  Rachel Meyer, Recreation Therapy Intern  Trenisha Lafavor, LRT/CTRS 

## 2016-12-12 NOTE — Progress Notes (Signed)
Patient did not attend group.

## 2016-12-12 NOTE — Social Work (Signed)
PAtient referred to [facility]  Francisco GeneraAnne Avett Reineck, LCSW Lead Clinical Social Worker Phone:  [phone]

## 2016-12-12 NOTE — [facility] Group Notes (Signed)
[facility] LCSW Group Therapy  12/12/2016 1:44 PM  Type of Therapy:  Group Therapy  Participation Level:  Did not attend  MHA Speaker came to talk about his personal journey with substance abuse and addiction. The pt processed ways by which to relate to the speaker. MHA speaker provided handouts and educational information pertaining to groups and services offered by [facility].   Patient participating in phone interview for treatment facility at time of group.    Sallee Langenne C Osman Calzadilla 12/12/2016, 1:44 PM

## 2016-12-12 NOTE — Progress Notes (Addendum)
[facility] MD Progress Note  12/12/2016 11:27 AM Francisco Beard  MRN:  [id number]   Subjective:  Patient states that he feels better today, however, he is still upset with his brother. As documented before he is upset with his brother for stressing out his mother and then she died and he feels it is his brother's fault. He states his mother had multiple medical conditions including COPD and CHF, but still feels his brother is to blame. He reports that he has been on numerous different medications in the past, but states that if he does not use any cocaine then he is stable on the medications he is currently taking. He has an ACTT team and has requested that they visit him more and move him from the boarding house he is in, but neither have happened yet. He feels that if his ACTT team increases visits and work harder on getting him moved will improve his outcomes upon discharge.  Principal Problem: Schizoaffective disorder (HCC) Diagnosis:   Patient Active Problem List   Diagnosis Date Noted  . Schizoaffective disorder (HCC) [F25.9] 12/10/2016  . Acute respiratory failure (HCC) [J96.00] 11/04/2016  . Allergic rhinitis [J30.9] 11/04/2016  . Tobacco use disorder [F17.200] 11/04/2016  . BPH (benign prostatic hyperplasia) [N40.0] 11/04/2016  . Acute exacerbation of COPD with asthma (HCC) [J44.1, J45.901] 07/09/2016  . Polysubstance abuse [F19.10] 07/09/2016  . Homicidal ideation [R45.850]   . HTN (hypertension) [I10] 04/17/2016  . Chest pain [R07.9]   . COPD exacerbation (HCC) [J44.1] 04/13/2016  . COPD (chronic obstructive pulmonary disease) (HCC) [J44.9] 04/13/2016  . Avascular necrosis of bone of right hip (HCC) [M87.051] 04/08/2016  . COPD with acute exacerbation (HCC) [J44.1] 04/01/2016  . Suicidal ideations [R45.851] 08/17/2013   Total Time spent with patient: 30 minutes  Past Psychiatric History: See H&P  Past Medical History:  Past Medical History:  Diagnosis Date  . Anxiety   . Arthritis     "hips; bottom part of my back" (07/09/2016)  . Asthmatic bronchitis   . Bipolar disorder (HCC)   . CHF (congestive heart failure) (HCC)   . Chronic bronchitis (HCC)   . Chronic hip pain   . COPD (chronic obstructive pulmonary disease) (HCC)   . Depression   . GERD (gastroesophageal reflux disease)   . High cholesterol   . History of hiatal hernia   . Hypertension   . Irregular heart beats   . Pneumonia    "3 times; it's been awhile" (07/09/2016)  . Schizophrenia (HCC)   . Type II diabetes mellitus (HCC)     Past Surgical History:  Procedure Laterality Date  . CLOSED REDUCTION HAND FRACTURE Right    "got 6 screws and a plate in there"  . ELBOW FRACTURE SURGERY Left    "put it through the wall"  . FRACTURE SURGERY     Family History:  Family History  Problem Relation Age of Onset  . Asthma Mother   . Heart murmur Sister   . Heart murmur Brother    Family Psychiatric  History: See H&P  Social History:  History  Alcohol Use  . 12.0 oz/week  . 20 Standard drinks or equivalent per week    Comment: 07/09/2016 "I had quit for 1 yr, relapsed 3 days ago"     History  Drug Use  . Types: "Crack" cocaine, Cocaine    Comment: 07/09/2016 "I had quit for 1 yr, relapsed 3 days ago"    Social History   Social History  .  Marital status: Divorced    Spouse name: N/A  . Number of children: N/A  . Years of education: N/A   Social History Main Topics  . Smoking status: Heavy Tobacco Smoker    Packs/day: 0.50    Years: 41.00    Types: Cigarettes    Last attempt to quit: 04/08/2016  . Smokeless tobacco: Never Used  . Alcohol use 12.0 oz/week    20 Standard drinks or equivalent per week     Comment: 07/09/2016 "I had quit for 1 yr, relapsed 3 days ago"  . Drug use: Yes    Types: "Crack" cocaine, Cocaine     Comment: 07/09/2016 "I had quit for 1 yr, relapsed 3 days ago"  . Sexual activity: Not Currently   Other Topics Concern  . None   Social History Narrative  . None    Additional Social History:    Pain Medications: see pta meds list - pt denies abuse Prescriptions: see pta meds list - pt denises abuse  Over the Counter: see pta meds list - pt denies abuse History of alcohol / drug use?: Yes Longest period of sobriety (when/how long): 5 yrs Negative Consequences of Use: Financial, Legal, Personal relationships, Work / School Name of Substance 1: crack cocaine 1 - Age of First Use: 30 1 - Amount (size/oz): 5 grams 1 - Frequency: daily 1 - Last Use / Amount: 12/04/16 -     Sleep: Good  Appetite:  Good  Current Medications: Current Facility-Administered Medications  Medication Dose Route Frequency Provider Last Rate Last Dose  . acetaminophen (TYLENOL) tablet 650 mg  650 mg Oral Q6H PRN Beau Fanny, FNP   650 mg at 12/12/16 1053  . albuterol (PROVENTIL HFA;VENTOLIN HFA) 108 (90 Base) MCG/ACT inhaler 1-2 puff  1-2 puff Inhalation Q6H PRN Beau Fanny, FNP   2 puff at 12/12/16 [phone]  . alum & mag hydroxide-simeth (MAALOX/MYLANTA) 200-200-20 MG/5ML suspension 30 mL  30 mL Oral Q4H PRN Withrow, Blythe C, FNP   30 mL at 12/12/16 1046  . divalproex (DEPAKOTE) DR tablet 500 mg  500 mg Oral Q12H Withrow, Kolton C, FNP   500 mg at 12/12/16 0809  . doxycycline (VIBRA-TABS) tablet 100 mg  100 mg Oral Q12H Nelly Rout, MD   100 mg at 12/12/16 0809  . DULoxetine (CYMBALTA) DR capsule 30 mg  30 mg Oral Daily Beau Fanny, FNP   30 mg at 12/12/16 0809  . magnesium hydroxide (MILK OF MAGNESIA) suspension 30 mL  30 mL Oral Daily PRN Withrow, Shneur C, FNP      . tamsulosin (FLOMAX) capsule 0.4 mg  0.4 mg Oral Daily Withrow, Ermine C, FNP   0.4 mg at 12/12/16 0809  . traZODone (DESYREL) tablet 50 mg  50 mg Oral QHS PRN Beau Fanny, FNP        Lab Results:  Results for orders placed or performed during the hospital encounter of 12/10/16 (from the past 48 hour(s))  Lipid panel     Status: Abnormal   Collection Time: 12/12/16  6:32 AM  Result Value Ref Range    Cholesterol 220 (H) 0 - 200 mg/dL   Triglycerides 76 <960 mg/dL   HDL 42 >45 mg/dL   Total CHOL/HDL Ratio 5.2 RATIO   VLDL 15 0 - 40 mg/dL   LDL Cholesterol 409 (H) 0 - 99 mg/dL    Comment:        Total Cholesterol/HDL:CHD Risk Coronary Heart Disease Risk Table  Men   Women  1/2 Average Risk   3.4   3.3  Average Risk       5.0   4.4  2 X Average Risk   9.6   7.1  3 X Average Risk  23.4   11.0        Use the calculated Patient Ratio above and the CHD Risk Table to determine the patient's CHD Risk.        ATP III CLASSIFICATION (LDL):  <100     mg/dL   Optimal  865-784  mg/dL   Near or Above                    Optimal  130-159  mg/dL   Borderline  696-295  mg/dL   High  >284     mg/dL   Very High Performed at [facility], [address], [place], [place] [address]     Blood Alcohol level:  Lab Results  Component Value Date   [facility] <5 12/09/2016   ETH <5 08/30/2016    Metabolic Disorder Labs: Lab Results  Component Value Date   HGBA1C 6.4 (H) 11/04/2016   MPG 137 11/04/2016   Lab Results  Component Value Date   PROLACTIN 53.1 (H) 07/18/2016   Lab Results  Component Value Date   CHOL 220 (H) 12/12/2016   TRIG 76 12/12/2016   HDL 42 12/12/2016   CHOLHDL 5.2 12/12/2016   VLDL 15 12/12/2016   LDLCALC 163 (H) 12/12/2016   LDLCALC 111 (H) 07/18/2016    Physical Findings: AIMS: Facial and Oral Movements Muscles of Facial Expression: Minimal Lips and Perioral Area: Minimal Jaw: None, normal Tongue: None, normal,Extremity Movements Upper (arms, wrists, hands, fingers): None, normal Lower (legs, knees, ankles, toes): None, normal, Trunk Movements Neck, shoulders, hips: None, normal, Overall Severity Severity of abnormal movements (highest score from questions above): None, normal Incapacitation due to abnormal movements: None, normal Patient's awareness of abnormal movements (rate only patient's report): No Awareness, Dental  Status Current problems with teeth and/or dentures?: No Does patient usually wear dentures?: No  CIWA:    COWS:     Musculoskeletal: Strength & Muscle Tone: decreased Gait & Station: unsteady, uses a walker to ambulate Patient leans: N/A  Psychiatric Specialty Exam: Physical Exam  Constitutional: He is oriented to person, place, and time. He appears well-developed.  Eyes: Pupils are equal, round, and reactive to light.  Cardiovascular: Normal rate.   Respiratory: Effort normal.  Genitourinary:  Genitourinary Comments: Deferred  Musculoskeletal:  Uses a walker for ambulation  Neurological: He is alert and oriented to person, place, and time.  Skin: Skin is warm.  Psychiatric: He has a normal mood and affect. His behavior is normal.  Thought content still focusing on blaming brother for mother's death    Review of Systems  Constitutional: Negative.   HENT: Negative.   Eyes: Negative.   Respiratory: Negative.   Cardiovascular: Negative.   Gastrointestinal: Negative.   Genitourinary: Negative.   Musculoskeletal: Negative for joint pain.  Skin: Negative.   Neurological: Negative.   Endo/Heme/Allergies: Negative.   Psychiatric/Behavioral: Positive for depression, hallucinations (hx of reported AVH) and substance abuse.    Blood pressure 120/76, pulse 82, temperature 99 F (37.2 C), temperature source Oral, resp. rate 16, height 5\' 11"  (1.803 m), weight 81.6 kg (180 lb), SpO2 98 %.Body mass index is 25.1 kg/m.  General Appearance: Casual  Eye Contact:  Good  Speech:  Clear and Coherent  and Normal Rate  Volume:  Normal  Mood:  Euthymic  Affect:  Appropriate  Thought Process:  Goal Directed and Descriptions of Associations: Intact  Orientation:  Full (Time, Place, and Person)  Thought Content:  Illogical and continues to blame brother for mother's death even though she had numerous medical conditions  Suicidal Thoughts:  No  Homicidal Thoughts:  No  Memory:  Recent;    Good  Judgement:  Fair  Insight:  Shallow  Psychomotor Activity:  Normal  Concentration:  Concentration: Good  Recall:  Good  Fund of Knowledge:  Fair  Language:  Good  Akathisia:  Negative  Handed:  Right  AIMS (if indicated):     Assets:  Housing Social Support Others:  ACTT  ADL's:  Intact  Cognition:  WNL  Sleep:  Number of Hours: 5.75     Treatment Plan Summary: Greater than 50% of face to face time with patient was spent on counseling and coordination of care. Discussed with patient assessment and evaluation of symptoms and progress in treatment of diagnosis of Schizoaffective disorder, Medication management and Plan to have his ACTT team increase visits due to his mother's death recently. ACTT assist in relaoction. Stop using cocaine. Will discuss with SWS to contact ACTT team and patient states that he will be comfortable being discharged with the above plan.  Gerlene Burdockravis B Davan Hark, FNP 12/12/2016, 11:27 AM

## 2016-12-13 DIAGNOSIS — F17210 Nicotine dependence, cigarettes, uncomplicated: Secondary | ICD-10-CM

## 2016-12-13 DIAGNOSIS — F250 Schizoaffective disorder, bipolar type: Secondary | ICD-10-CM

## 2016-12-13 DIAGNOSIS — F1910 Other psychoactive substance abuse, uncomplicated: Secondary | ICD-10-CM

## 2016-12-13 DIAGNOSIS — F39 Unspecified mood [affective] disorder: Secondary | ICD-10-CM

## 2016-12-13 DIAGNOSIS — G4700 Insomnia, unspecified: Secondary | ICD-10-CM

## 2016-12-13 DIAGNOSIS — F1490 Cocaine use, unspecified, uncomplicated: Secondary | ICD-10-CM

## 2016-12-13 LAB — HEMOGLOBIN A1C
Hgb A1c MFr Bld: 6 % — ABNORMAL HIGH (ref 4.8–5.6)
Mean Plasma Glucose: 126 mg/dL

## 2016-12-13 MED ORDER — ALBUTEROL SULFATE (2.5 MG/3ML) 0.083% IN NEBU
2.5000 mg | INHALATION_SOLUTION | RESPIRATORY_TRACT | Status: DC | PRN
  Administered 2016-12-13: 2.5 mg via RESPIRATORY_TRACT
  Filled 2016-12-13 (×2): qty 3

## 2016-12-13 MED ORDER — MOMETASONE FURO-FORMOTEROL FUM 100-5 MCG/ACT IN AERO
2.0000 | INHALATION_SPRAY | Freq: Two times a day (BID) | RESPIRATORY_TRACT | Status: DC
  Administered 2016-12-13: 2 via RESPIRATORY_TRACT
  Filled 2016-12-13 (×2): qty 8.8

## 2016-12-13 MED ORDER — TIOTROPIUM BROMIDE MONOHYDRATE 18 MCG IN CAPS
18.0000 ug | ORAL_CAPSULE | Freq: Every day | RESPIRATORY_TRACT | Status: DC
  Filled 2016-12-13: qty 5

## 2016-12-13 NOTE — Progress Notes (Signed)
[facility]  12/13/2016 2:54 PM Francisco AsalJohn Schiavi  MRN:  [id number]   Subjective: English reports, "I was hearing voices when I came in to this hospital. The voices are still going, decreased. I have some depression going on too due my poor health issues. I need hip replacements. I'm in pain all the time. I can't walk well or stand without the help of walker. I still have some withdrawal symptoms, nausea & headache".  Objective: Francisco RuizJohn is seen, chart reviewed. Discussed this case with the treatment team. He states that he feels depressed & hearing some voices, only that the voices are not as intense. Dwan walks with a walker to aid his ambulation due to chronic pain related to degenerative joint disease to his hip areas. He says he is not very active in the group sessions because he is not feeling well most of the time. He is cooperative with staff, taking & tolerating his medications without any adverse effects reported. His UDS was positive for Cocaine upon admission. He says today, sometimes he hears voices when coming off cocaine. Although reports hearing voices, he physically does not appear to be responding to any internal stimuli. Staff continue to provided support & encouragement. Inez did neither mention his brother or his mother's death today. Reviewed Depakote level result, slightly low at 47.  Principal Problem: Schizoaffective disorder (HCC) Diagnosis:   Patient Active Problem List   Diagnosis Date Noted  . Schizoaffective disorder (HCC) [F25.9] 12/10/2016  . Acute respiratory failure (HCC) [J96.00] 11/04/2016  . Allergic rhinitis [J30.9] 11/04/2016  . Tobacco use disorder [F17.200] 11/04/2016  . BPH (benign prostatic hyperplasia) [N40.0] 11/04/2016  . Acute exacerbation of COPD with asthma (HCC) [J44.1, J45.901] 07/09/2016  . Polysubstance abuse [F19.10] 07/09/2016  . Homicidal ideation [R45.850]   . HTN (hypertension) [I10] 04/17/2016  . Chest pain [R07.9]   . COPD exacerbation  (HCC) [J44.1] 04/13/2016  . COPD (chronic obstructive pulmonary disease) (HCC) [J44.9] 04/13/2016  . Avascular necrosis of bone of right hip (HCC) [M87.051] 04/08/2016  . COPD with acute exacerbation (HCC) [J44.1] 04/01/2016  . Suicidal ideations [R45.851] 08/17/2013   Total Time spent with patient: 30 minutes  Past Psychiatric History: See H&P  Past Medical History:  Past Medical History:  Diagnosis Date  . Anxiety   . Arthritis    "hips; bottom part of my back" (07/09/2016)  . Asthmatic bronchitis   . Bipolar disorder (HCC)   . CHF (congestive heart failure) (HCC)   . Chronic bronchitis (HCC)   . Chronic hip pain   . COPD (chronic obstructive pulmonary disease) (HCC)   . Depression   . GERD (gastroesophageal reflux disease)   . High cholesterol   . History of hiatal hernia   . Hypertension   . Irregular heart beats   . Pneumonia    "3 times; it's been awhile" (07/09/2016)  . Schizophrenia (HCC)   . Type II diabetes mellitus (HCC)     Past Surgical History:  Procedure Laterality Date  . CLOSED REDUCTION HAND FRACTURE Right    "got 6 screws and a plate in there"  . ELBOW FRACTURE SURGERY Left    "put it through the wall"  . FRACTURE SURGERY     Family History:  Family History  Problem Relation Age of Onset  . Asthma Mother   . Heart murmur Sister   . Heart murmur Brother    Family Psychiatric  History: See H&P  Social History:  History  Alcohol Use  .  12.0 oz/week  . 20 Standard drinks or equivalent per week    Comment: 07/09/2016 "I had quit for 1 yr, relapsed 3 days ago"     History  Drug Use  . Types: "Crack" cocaine, Cocaine    Comment: 07/09/2016 "I had quit for 1 yr, relapsed 3 days ago"    Social History   Social History  . Marital status: Divorced    Spouse name: N/A  . Number of children: N/A  . Years of education: N/A   Social History Main Topics  . Smoking status: Heavy Tobacco Smoker    Packs/day: 0.50    Years: 41.00    Types: Cigarettes     Last attempt to quit: 04/08/2016  . Smokeless tobacco: Never Used  . Alcohol use 12.0 oz/week    20 Standard drinks or equivalent per week     Comment: 07/09/2016 "I had quit for 1 yr, relapsed 3 days ago"  . Drug use: Yes    Types: "Crack" cocaine, Cocaine     Comment: 07/09/2016 "I had quit for 1 yr, relapsed 3 days ago"  . Sexual activity: Not Currently   Other Topics Concern  . None   Social History Narrative  . None   Additional Social History:    Pain Medications: see pta meds list - pt denies abuse Prescriptions: see pta meds list - pt denises abuse  Over the Counter: see pta meds list - pt denies abuse History of alcohol / drug use?: Yes Longest period of sobriety (when/how long): 5 yrs Negative Consequences of Use: Financial, Legal, Personal relationships, Work / School Name of Substance 1: crack cocaine 1 - Age of First Use: 30 1 - Amount (size/oz): 5 grams 1 - Frequency: daily 1 - Last Use / Amount: 12/04/16 -     Sleep: Good  Appetite:  Good  Current Medications: Current Facility-Administered Medications  Medication Dose Route Frequency Provider Last Rate Last Dose  . acetaminophen (TYLENOL) tablet 650 mg  650 mg Oral Q6H PRN Beau Fanny, FNP   650 mg at 12/13/16 0951  . albuterol (PROVENTIL HFA;VENTOLIN HFA) 108 (90 Base) MCG/ACT inhaler 1-2 puff  1-2 puff Inhalation Q6H PRN Beau Fanny, FNP   2 puff at 12/12/16 1603  . alum & mag hydroxide-simeth (MAALOX/MYLANTA) 200-200-20 MG/5ML suspension 30 mL  30 mL Oral Q4H PRN Beau Fanny, FNP   30 mL at 12/12/16 1942  . divalproex (DEPAKOTE) DR tablet 500 mg  500 mg Oral Q12H Withrow, [place] C, FNP   500 mg at 12/13/16 0949  . doxycycline (VIBRA-TABS) tablet 100 mg  100 mg Oral Q12H Nelly Rout, MD   100 mg at 12/13/16 0948  . DULoxetine (CYMBALTA) DR capsule 30 mg  30 mg Oral Daily Beau Fanny, FNP   30 mg at 12/13/16 0949  . magnesium hydroxide (MILK OF MAGNESIA) suspension 30 mL  30 mL Oral Daily PRN  Beau Fanny, FNP   30 mL at 12/13/16 0951  . tamsulosin (FLOMAX) capsule 0.4 mg  0.4 mg Oral Daily Beau Fanny, FNP   0.4 mg at 12/13/16 0949  . traZODone (DESYREL) tablet 50 mg  50 mg Oral QHS PRN Beau Fanny, FNP   50 mg at 12/12/16 2103    Lab Results:  Results for orders placed or performed during the hospital encounter of 12/10/16 (from the past 48 hour(s))  Lipid panel     Status: Abnormal   Collection Time: 12/12/16  6:32 AM  Result Value Ref Range   Cholesterol 220 (H) 0 - 200 mg/dL   Triglycerides 76 <409 mg/dL   HDL 42 >81 mg/dL   Total CHOL/HDL Ratio 5.2 RATIO   VLDL 15 0 - 40 mg/dL   LDL Cholesterol 191 (H) 0 - 99 mg/dL    Comment:        Total Cholesterol/HDL:CHD Risk Coronary Heart Disease Risk Table                     Men   Women  1/2 Average Risk   3.4   3.3  Average Risk       5.0   4.4  2 X Average Risk   9.6   7.1  3 X Average Risk  23.4   11.0        Use the calculated Patient Ratio above and the CHD Risk Table to determine the patient's CHD Risk.        ATP III CLASSIFICATION (LDL):  <100     mg/dL   Optimal  478-295  mg/dL   Near or Above                    Optimal  130-159  mg/dL   Borderline  621-308  mg/dL   High  >657     mg/dL   Very High Performed at [facility], [address], [place], [place] [address]   Hemoglobin A1c     Status: Abnormal   Collection Time: 12/12/16  6:32 AM  Result Value Ref Range   Hgb A1c MFr Bld 6.0 (H) 4.8 - 5.6 %    Comment: (NOTE)         Pre-diabetes: 5.7 - 6.4         Diabetes: >6.4         Glycemic control for adults with diabetes: <7.0    Mean Plasma Glucose 126 mg/dL    Comment: (NOTE) Performed At: [facility] [address] [place], [place] [id number] Mila Homer MD GM:[id number] Performed at [facility], [address] [place], [place] [address]   TSH     Status: None   Collection Time: 12/12/16  6:31 PM  Result Value Ref Range   TSH 2.305  0.350 - 4.500 uIU/mL    Comment: Performed by a 3rd Generation assay with a functional sensitivity of <=0.01 uIU/mL. Performed at [facility], [address], [place], [place] [address]   Valproic acid level     Status: Abnormal   Collection Time: 12/12/16  6:31 PM  Result Value Ref Range   Valproic Acid Lvl 47 (L) 50.0 - 100.0 ug/mL    Comment: Performed at [facility], [address] [place], [place] [address]    Blood Alcohol level:  Lab Results  Component Value Date   [facility] <5 12/09/2016   ETH <5 08/30/2016    Metabolic Disorder Labs: Lab Results  Component Value Date   HGBA1C 6.0 (H) 12/12/2016   MPG 126 12/12/2016   MPG 137 11/04/2016   Lab Results  Component Value Date   PROLACTIN 53.1 (H) 07/18/2016   Lab Results  Component Value Date   CHOL 220 (H) 12/12/2016   TRIG 76 12/12/2016   HDL 42 12/12/2016   CHOLHDL 5.2 12/12/2016   VLDL 15 12/12/2016   LDLCALC 163 (H) 12/12/2016   LDLCALC 111 (H) 07/18/2016    Physical Findings:  AIMS: Facial and Oral Movements Muscles of Facial Expression: Minimal Lips and Perioral Area: Minimal Jaw: None, normal Tongue: None, normal,Extremity Movements Upper (arms, wrists, hands, fingers): None, normal Lower (legs, knees, ankles, toes): None, normal, Trunk Movements Neck, shoulders, hips: None, normal, Overall Severity Severity of abnormal movements (highest score from questions above): None, normal Incapacitation due to abnormal movements: None, normal Patient's awareness of abnormal movements (rate only patient's report): No Awareness, Dental Status Current problems with teeth and/or dentures?: No Does patient usually wear dentures?: No  CIWA:    COWS:     Musculoskeletal: Strength & Muscle Tone: decreased Gait & Station: unsteady, uses a walker to ambulate Patient leans: N/A  Psychiatric Specialty Exam: Physical Exam  Constitutional: He is oriented to person, place, and  time. He appears well-developed.  Eyes: Pupils are equal, round, and reactive to light.  Cardiovascular: Normal rate.   Respiratory: Effort normal.  Genitourinary:  Genitourinary Comments: Deferred  Musculoskeletal:  Uses a walker for ambulation  Neurological: He is alert and oriented to person, place, and time.  Skin: Skin is warm.  Psychiatric: He has a normal mood and affect. His behavior is normal.  Thought content still focusing on blaming brother for mother's death    Review of Systems  Constitutional: Negative.   HENT: Negative.   Eyes: Negative.   Respiratory: Negative.   Cardiovascular: Negative.   Gastrointestinal: Negative.   Genitourinary: Negative.   Musculoskeletal: Negative for joint pain.  Skin: Negative.   Neurological: Negative.   Endo/Heme/Allergies: Negative.   Psychiatric/Behavioral: Positive for depression, hallucinations (hx of reported AVH) and substance abuse.    Blood pressure 118/78, pulse 80, temperature 98.5 F (36.9 C), resp. rate 18, height 5\' 11"  (1.803 m), weight 81.6 kg (180 lb), SpO2 98 %.Body mass index is 25.1 kg/m.  General Appearance: Disheveled, foul body odor.  Eye Contact:  Good  Speech:  Clear and Coherent and Normal Rate  Volume:  Normal  Mood:  "I'm still feeling depressed, just a little improvement"  Affect:  Flat  Thought Process:  Coherent, Goal Directed and Descriptions of Associations: Intact  Orientation:  Full (Time, Place, and Person)  Thought Content:  Rumination  Suicidal Thoughts:  No, denies any thoughts, plans or intent.  Homicidal Thoughts:  No  Memory:  Immediate;   Good Recent;   Good Remote;   Good  Judgement:  Fair  Insight:  Shallow  Psychomotor Activity:  Decreased  Concentration:  Concentration: Fair and Attention Span: Fair  Recall:  Good  Fund of Knowledge:  Fair  Language:  Good  Akathisia:  Negative  Handed:  Right  AIMS (if indicated):     Assets:  Housing Social Support Others:  ACTT   ADL's:  Intact  Cognition:  WNL  Sleep:  Number of Hours: 6.5   Treatment Plan Summary: Patient is at his baseline. No evidence of psychosis. No evidence of mania. No dangerousness. We are finalizing aftercare    Psychiatric:   Medical:  Continue Doxycycline 100 mg bid for infection. Continue Flomax 4 mg for BPH. Continue Albuterol inhaler 108 (90) Mcg prn Q 4 hours for SOB. Will continue monitor for any symptoms & treat on a prn basis.  Psychosocial:  Recent death of mother. Will encourage group counseling attendance & participation.  PLAN: 1.12-13-16: No changes made on the current plan of care, continue current regimen as recommended.  Mood Stabilization:  Will continue Depakote DR 500 mg in am bid.  Depression: Continue Duloxetine 30  mg.  Insomnia:  Continue Trazodone 50 mg prn.  2. Continue to monitor mood, behavior and interaction with peers. Reviewed Depakote level, low at 47.  Sanjuana Kava, NP, PMHNP. 12/13/2016, 2:54 PMPatient ID: Francisco Beard, male   DOB: 1961/01/21, 56 y.o.   MRN: [id number]

## 2016-12-13 NOTE — Progress Notes (Signed)
Adult Psychoeducational Group Note  Date:  12/13/2016 Time:  9:15 PM  Group Topic/Focus:  Wrap-Up Group:   The focus of this group is to help patients review their daily goal of treatment and discuss progress on daily workbooks.  Participation Level:  Minimal  Participation Quality:  Appropriate  Affect:  Flat  Cognitive:  Oriented  Insight: Limited  Engagement in Group:  Poor  Modes of Intervention:  Socialization and Support  Additional Comments:  Patient attended but did not participated in group. He stated that he was not feeling well.  Lita MainsFrancis, Zamarian Scarano [facility] 12/13/2016, 9:15 PM

## 2016-12-13 NOTE — Plan of Care (Signed)
Problem: Safety: Goal: Ability to demonstrate self-control will improve Outcome: Progressing Pt has remained in control of his behavior tonight.    

## 2016-12-13 NOTE — Progress Notes (Signed)
Recreation Therapy Notes  Date: 12/13/2016 Time: 10:00am  Location: [address] Dayroom  Group Topic: Teamwork and Problem-Solving  Goal Area(s) Addresses:  Patient will be able to successfully identify what characteristics make up teamwork. Patient will be able to successfully identify how teamwork can benefit their lives post d/c. Patient will be able to successfully identify how problem-solving skills can improve their lives post d/c.  Intervention: Game  Activity: Pts will work together to keep a beach ball from touching the ground. Pts will hit ball back and forth to one another to accomplish this goal.  Education: Teamwork, Problem-Solving, Discharge Planning  Education Outcome: Needs additional education  Clinical Observations/Feedback: Pt did not attend group.  Rachel Meyer, Recreation Therapy Intern  Lowell Makara, LRT/CTRS 

## 2016-12-13 NOTE — Progress Notes (Signed)
D: Pt was in dayroom upon initial approach.  He presents with depressed affect and mood.  Describes his day as "lovely" and reports the best part of his day was "talking to the doctor, listening to the group."  Denies SI/HI, denies hallucinations, reports R hip pain of 8/10.  Interacts with peers and staff appropriately.  Did not attend evening group.  Pt has been ambulating with walker.   A: Met with pt and provided support and encouragement.  Medication administered per order.  PRN medication administered for pain, indigestion, and sleep.  Fall prevention techniques reviewed with pt, pt verbalized understanding.  Q15 minute safety checks maintained.  R: Pt is compliant with medications.  He verbally contracts for safety.  Will continue to monitor and assess.

## 2016-12-13 NOTE — Progress Notes (Signed)
Patient has been isolative to the room this shift.  Patient denies SI, HI, and AVH but reports not feeling physically well due to detoxing.  Assess patient for for safety, monitor for withdrawal symptoms, offer medications as prescribed, engage patient in 1:1 staff talks.   Continue to monitor as planned, patient able to contract for safety.

## 2016-12-13 NOTE — [facility] Group Notes (Signed)
[facility] LCSW Group Therapy  12/13/2016  1:05 PM  Type of Therapy:  Group therapy  Participation Level:  Active  Participation Quality:  Attentive  Affect:  Flat  Cognitive:  Oriented  Insight:  Limited  Engagement in Therapy:  Limited  Modes of Intervention:  Discussion, Socialization  Summary of Progress/Problems:  Chaplain was here to lead a group on themes of hope and courage. Invited.  In bed, engageable, stated he was in pain and had no appetite. Declined attendance.  Francisco Beard, Francisco Beard 12/13/2016 1:30 PM

## 2016-12-13 NOTE — Social Work (Signed)
CSW called [facility] to verify receipt of referral of patient.  Pt will be reviewed today.  Santa GeneraAnne Jaquarious Grey, LCSW Lead Clinical Social Worker Phone:  [phone]

## 2016-12-13 NOTE — Progress Notes (Signed)
Writer has observed patient up in the dayroom watching tv and little to no interaction with peers or staff. Writer spoke with him 1:1 and he c/o feeling bad today, he requested medication for acid reflux and pain medication for his right hip. Writer observed that he has a loose, rattling non- productive cough. As the evening progressed he appeared to have loud wheezing and SOB. He complained of feeling cold but vital show no fever. NP Nira ConnJason Berry assessed patient and nebulizer treatment given along with his albuterol and dulera inhaler. Patient reassessed and no improvement in his breathing and still complaining of chills. He is resting with head of bed elevated for comfort. Support given and safety maintained on unit with 15 min checks.

## 2016-12-13 NOTE — Tx Team (Signed)
Interdisciplinary Treatment and Diagnostic Plan Update  12/13/2016 Time of Session: 10:12 AM  Francisco Beard MRN: [id number]  Principal Diagnosis: Schizoaffective disorder [facility])  Secondary Diagnoses: Principal Problem:   Schizoaffective disorder ([place]) Active Problems:   Polysubstance abuse   Current Medications:  Current Facility-Administered Medications  Medication Dose Route Frequency Provider Last Rate Last Dose  . acetaminophen (TYLENOL) tablet 650 mg  650 mg Oral Q6H PRN Benjamine Mola, FNP   650 mg at 12/13/16 0951  . albuterol (PROVENTIL HFA;VENTOLIN HFA) 108 (90 Base) MCG/ACT inhaler 1-2 puff  1-2 puff Inhalation Q6H PRN Benjamine Mola, FNP   2 puff at 12/12/16 1603  . alum & mag hydroxide-simeth (MAALOX/MYLANTA) 200-200-20 MG/5ML suspension 30 mL  30 mL Oral Q4H PRN Benjamine Mola, FNP   30 mL at 12/12/16 1942  . divalproex (DEPAKOTE) DR tablet 500 mg  500 mg Oral Q12H [place], Birdie C, FNP   500 mg at 12/13/16 0949  . doxycycline (VIBRA-TABS) tablet 100 mg  100 mg Oral Q12H Hampton Abbot, MD   100 mg at 12/13/16 0948  . DULoxetine (CYMBALTA) DR capsule 30 mg  30 mg Oral Daily Benjamine Mola, FNP   30 mg at 12/13/16 0949  . magnesium hydroxide (MILK OF MAGNESIA) suspension 30 mL  30 mL Oral Daily PRN Benjamine Mola, FNP   30 mL at 12/13/16 0951  . tamsulosin (FLOMAX) capsule 0.4 mg  0.4 mg Oral Daily Benjamine Mola, FNP   0.4 mg at 12/13/16 0949  . traZODone (DESYREL) tablet 50 mg  50 mg Oral QHS PRN Benjamine Mola, FNP   50 mg at 12/12/16 2103    PTA Medications: Prescriptions Prior to Admission  Medication Sig Dispense Refill Last Dose  . albuterol (PROVENTIL HFA;VENTOLIN HFA) 108 (90 Base) MCG/ACT inhaler Inhale 1-2 puffs into the lungs every 4 (four) hours as needed for wheezing or shortness of breath. 18 g 2 12/09/2016 at Unknown time  . albuterol (PROVENTIL) (2.5 MG/3ML) 0.083% nebulizer solution Take 3 mLs (2.5 mg total) by nebulization every 4 (four) hours as needed  for wheezing or shortness of breath. 10 vial 0 unknown  . amLODipine (NORVASC) 5 MG tablet Take 1 tablet (5 mg total) by mouth daily. 90 tablet 0 12/08/2016 at Unknown time  . Ascorbic Acid (VITAMIN C PO) Take 1 tablet by mouth daily.   12/09/2016 at Unknown time  . atorvastatin (LIPITOR) 20 MG tablet Take 20 mg by mouth daily.   12/08/2016 at Unknown time  . budesonide-formoterol (SYMBICORT) 80-4.5 MCG/ACT inhaler Inhale 2 puffs into the lungs 2 (two) times daily. 1 Inhaler 12 Past Week at Unknown time  . cholecalciferol (VITAMIN D) 1000 units tablet Take 1,000 Units by mouth 2 (two) times daily.   12/08/2016 at Unknown time  . divalproex (DEPAKOTE) 500 MG DR tablet Take 1 tablet (500 mg total) by mouth 2 (two) times daily. (Patient not taking: Reported on 12/09/2016) 60 tablet 0 Not Taking at Unknown time  . divalproex (DEPAKOTE) 500 MG DR tablet Take 500 mg by mouth 2 (two) times daily.    12/08/2016 at 2000  . DULoxetine (CYMBALTA) 30 MG capsule Take 30 mg by mouth daily.   Past Week at Unknown time  . famotidine (PEPCID AC) 10 MG chewable tablet Chew 10 mg by mouth 2 (two) times daily as needed for heartburn.   Past Week at Unknown time  . ferrous gluconate (FERGON) 324 MG tablet Take 1 tablet (324 mg total)  by mouth daily with breakfast. 90 tablet 3 Past Week at Unknown time  . finasteride (PROSCAR) 5 MG tablet Take 1 tablet (5 mg total) by mouth daily. 30 tablet 2 Past Week at Unknown time  . fluPHENAZine (PROLIXIN) 10 MG tablet Take 10-20 mg by mouth See admin instructions. Pt takes 85m in am, 210min pm   Past Week at Unknown time  . fluticasone (FLONASE) 50 MCG/ACT nasal spray Place 1 spray into both nostrils daily. 16 g 2 12/09/2016 at Unknown time  . folic acid (FOLVITE) 1 MG tablet Take 1 mg by mouth daily.   Past Week at Unknown time  . HYDROcodone-acetaminophen (NORCO/VICODIN) 5-325 MG tablet Take 1 tablet by mouth every 6 (six) hours as needed for moderate pain. (Patient not taking: Reported on  12/09/2016) 20 tablet 0 Not Taking at Unknown time  . pantoprazole (PROTONIX) 40 MG tablet Take 1 tablet (40 mg total) by mouth 2 (two) times daily. 60 tablet 2 12/08/2016 at Unknown time  . tamsulosin (FLOMAX) 0.4 MG CAPS capsule Take 1 capsule (0.4 mg total) by mouth daily. 30 capsule 2 12/09/2016 at Unknown time  . theophylline (THEO-24) 300 MG 24 hr capsule Take 300 mg by mouth daily.   Past Week at Unknown time  . thiamine 100 MG tablet Take 1 tablet (100 mg total) by mouth daily. 30 tablet 0 12/09/2016 at Unknown time  . tiotropium (SPIRIVA HANDIHALER) 18 MCG inhalation capsule Place 1 capsule (18 mcg total) into inhaler and inhale daily. 60 capsule 2 12/09/2016 at Unknown time    Patient Stressors: Financial difficulties Health problems Substance abuse  Patient Strengths: CoCuratorund of knowledge Motivation for treatment/growth  Treatment Modalities: Medication Management, Group therapy, Case management,  1 to 1 session with clinician, Psychoeducation, Recreational therapy.   Physician Treatment Plan for Primary Diagnosis: Schizoaffective disorder ([place]eline[place] Term Goal(s): Improvement in symptoms so as ready for discharge  Short Term Goals: Ability to verbalize feelings will improve Ability to demonstrate self-control will improve Ability to identify and develop effective coping behaviors will improve Ability to identify triggers associated with substance abuse/mental health issues will improve Ability to demonstrate self-control will improve Ability to identify and develop effective coping behaviors will improve Ability to identify triggers associated with substance abuse/mental health issues will improve  Medication Management: Evaluate patient's response, side effects, and tolerance of medication regimen.  Therapeutic Interventions: 1 to 1 sessions, Unit Group sessions and Medication administration.  Evaluation of Outcomes: Progressing  Physician Treatment  Plan for Secondary Diagnosis: Principal Problem:   Schizoaffective disorder (HC[place]aron HillActive Problems:   Polysubstance abuse   Long Term Goal(s): Improvement in symptoms so as ready for discharge  Short Term Goals: Ability to verbalize feelings will improve Ability to demonstrate self-control will improve Ability to identify and develop effective coping behaviors will improve Ability to identify triggers associated with substance abuse/mental health issues will improve Ability to demonstrate self-control will improve Ability to identify and develop effective coping behaviors will improve Ability to identify triggers associated with substance abuse/mental health issues will improve  Medication Management: Evaluate patient's response, side effects, and tolerance of medication regimen.  Therapeutic Interventions: 1 to 1 sessions, Unit Group sessions and Medication administration.  Evaluation of Outcomes: Progressing   RN Treatment Plan for Primary Diagnosis: Schizoaffective disorder (HCSunburgLong Term Goal(s): Knowledge of disease and therapeutic regimen to maintain health will improve  Short Term Goals: Ability to identify and develop effective coping behaviors will improve and Compliance  with prescribed medications will improve  Medication Management: RN will administer medications as ordered by provider, will assess and evaluate patient's response and provide education to patient for prescribed medication. RN will report any adverse and/or side effects to prescribing provider.  Therapeutic Interventions: 1 on 1 counseling sessions, Psychoeducation, Medication administration, Evaluate responses to treatment, Monitor vital signs and CBGs as ordered, Perform/monitor CIWA, COWS, AIMS and Fall Risk screenings as ordered, Perform wound care treatments as ordered.  Evaluation of Outcomes: Progressing   LCSW Treatment Plan for Primary Diagnosis: Schizoaffective disorder ([place]) Long Term Goal(s):  Safe transition to appropriate next level of care at discharge, Engage patient in therapeutic group addressing interpersonal concerns.  Short Term Goals: Engage patient in aftercare planning with referrals and resources  Therapeutic Interventions: Assess for all discharge needs, 1 to 1 time with Social worker, Explore available resources and support systems, Assess for adequacy in community support network, Educate family and significant other(s) on suicide prevention, Complete Psychosocial Assessment, Interpersonal group therapy.  Evaluation of Outcomes: Met  Either go to [facility] from here-referral sent and confirmed-or return home and follow up with [facility] ACT team   Progress in Treatment: Attending groups: No Participating in groups: No Taking medication as prescribed: Yes Toleration medication: Yes, no side effects reported at this time Family/Significant other contact made: Yes ACT team Patient understands diagnosis: No Limited insight Discussing patient identified problems/goals with staff: Yes Medical problems stabilized or resolved: Yes Denies suicidal/homicidal ideation: Yes Issues/concerns per patient self-inventory: None Other: N/A  New problem(s) identified: None identified at this time.   New Short Term/Long Term Goal(s): None identified at this time.   Discharge Plan or Barriers:   Reason for Continuation of Hospitalization: Depression Hallucinations Medication stabilization Withdrawal symptoms  Estimated Length of Stay: 3-5 days  Attendees: Patient: 12/13/2016  10:12 AM  Physician: Hampton Abbot, MD 12/13/2016  10:12 AM  Nursing: Sena Hitch, RN 12/13/2016  10:12 AM  RN Care Manager: Lars Pinks, RN 12/13/2016  10:12 AM  Social Worker: Ripley Fraise 12/13/2016  10:12 AM  Recreational Therapist: Winfield Cunas 12/13/2016  10:12 AM  Other: Norberto Sorenson 12/13/2016  10:12 AM  Other:  12/13/2016  10:12 AM    Scribe for Treatment Team:  Roque Lias LCSW  12/13/2016 10:12 AM

## 2016-12-14 ENCOUNTER — Inpatient Hospital Stay (HOSPITAL_COMMUNITY): Payer: Medicaid Other

## 2016-12-14 ENCOUNTER — Inpatient Hospital Stay (HOSPITAL_COMMUNITY)
Admission: AD | Admit: 2016-12-14 | Discharge: 2016-12-15 | Disposition: A | Discharge status: Still In Hospital | Payer: Medicaid Other | Source: Intra-hospital | Attending: Psychiatry | Admitting: Psychiatry

## 2016-12-14 ENCOUNTER — Encounter (HOSPITAL_COMMUNITY): Payer: Self-pay | Admitting: Oncology

## 2016-12-14 DIAGNOSIS — F259 Schizoaffective disorder, unspecified: Secondary | ICD-10-CM | POA: Diagnosis present

## 2016-12-14 LAB — CBC WITH DIFFERENTIAL/PLATELET
BASOS ABS: 0 10*3/uL (ref 0.0–0.1)
Basophils Relative: 0 %
EOS PCT: 6 %
Eosinophils Absolute: 0.4 10*3/uL (ref 0.0–0.7)
HCT: 39 % (ref 39.0–52.0)
HEMOGLOBIN: 13.2 g/dL (ref 13.0–17.0)
LYMPHS PCT: 31 %
Lymphs Abs: 1.9 10*3/uL (ref 0.7–4.0)
MCH: 29.4 pg (ref 26.0–34.0)
MCHC: 33.8 g/dL (ref 30.0–36.0)
MCV: 86.9 fL (ref 78.0–100.0)
Monocytes Absolute: 0.3 10*3/uL (ref 0.1–1.0)
Monocytes Relative: 5 %
NEUTROS PCT: 58 %
Neutro Abs: 3.5 10*3/uL (ref 1.7–7.7)
PLATELETS: 249 10*3/uL (ref 150–400)
RBC: 4.49 MIL/uL (ref 4.22–5.81)
RDW: 15.2 % (ref 11.5–15.5)
WBC: 6.1 10*3/uL (ref 4.0–10.5)

## 2016-12-14 LAB — BASIC METABOLIC PANEL
ANION GAP: 9 (ref 5–15)
BUN: 10 mg/dL (ref 6–20)
CHLORIDE: 109 mmol/L (ref 101–111)
CO2: 22 mmol/L (ref 22–32)
Calcium: 8.8 mg/dL — ABNORMAL LOW (ref 8.9–10.3)
Creatinine, Ser: 0.84 mg/dL (ref 0.61–1.24)
Glucose, Bld: 97 mg/dL (ref 65–99)
POTASSIUM: 3.5 mmol/L (ref 3.5–5.1)
SODIUM: 140 mmol/L (ref 135–145)

## 2016-12-14 MED ORDER — ONDANSETRON HCL 4 MG/2ML IJ SOLN
4.0000 mg | Freq: Once | INTRAMUSCULAR | Status: AC
  Administered 2016-12-14: 4 mg via INTRAVENOUS

## 2016-12-14 MED ORDER — ONDANSETRON 8 MG PO TBDP: 8.0000 mg | ORAL_TABLET | Freq: Three times a day (TID) | ORAL | 0 refills | Status: DC | PRN

## 2016-12-14 MED ORDER — DULOXETINE HCL 30 MG PO CPEP
30.0000 mg | ORAL_CAPSULE | Freq: Every day | ORAL | Status: DC
  Filled 2016-12-14 (×3): qty 1

## 2016-12-14 MED ORDER — DICYCLOMINE HCL 10 MG/ML IM SOLN
20.0000 mg | Freq: Once | INTRAMUSCULAR | Status: AC
  Administered 2016-12-14: 20 mg via INTRAMUSCULAR
  Filled 2016-12-14: qty 2

## 2016-12-14 MED ORDER — ALBUTEROL SULFATE (2.5 MG/3ML) 0.083% IN NEBU
5.0000 mg | INHALATION_SOLUTION | Freq: Once | RESPIRATORY_TRACT | Status: DC
  Filled 2016-12-14: qty 6

## 2016-12-14 MED ORDER — SODIUM CHLORIDE 0.9 % IV BOLUS (SEPSIS)
500.0000 mL | Freq: Once | INTRAVENOUS | Status: AC
  Administered 2016-12-14: 500 mL via INTRAVENOUS

## 2016-12-14 MED ORDER — TAMSULOSIN HCL 0.4 MG PO CAPS
0.4000 mg | ORAL_CAPSULE | Freq: Every day | ORAL | Status: DC
  Filled 2016-12-14 (×3): qty 1

## 2016-12-14 MED ORDER — SODIUM CHLORIDE 0.9 % IV BOLUS (SEPSIS)
1000.0000 mL | Freq: Once | INTRAVENOUS | Status: AC
  Administered 2016-12-14: 1000 mL via INTRAVENOUS

## 2016-12-14 MED ORDER — TIOTROPIUM BROMIDE MONOHYDRATE 18 MCG IN CAPS
18.0000 ug | ORAL_CAPSULE | Freq: Every day | RESPIRATORY_TRACT | Status: DC
  Filled 2016-12-14: qty 5

## 2016-12-14 MED ORDER — MAGNESIUM HYDROXIDE 400 MG/5ML PO SUSP: 30.0000 mL | Freq: Every day | ORAL | Status: DC | PRN

## 2016-12-14 MED ORDER — METHYLPREDNISOLONE SODIUM SUCC 125 MG IJ SOLR
125.0000 mg | Freq: Once | INTRAMUSCULAR | Status: AC
  Administered 2016-12-14: 125 mg via INTRAVENOUS
  Filled 2016-12-14: qty 2

## 2016-12-14 MED ORDER — METOCLOPRAMIDE HCL 5 MG/ML IJ SOLN
10.0000 mg | Freq: Once | INTRAMUSCULAR | Status: AC
  Administered 2016-12-14: 10 mg via INTRAVENOUS
  Filled 2016-12-14: qty 2

## 2016-12-14 MED ORDER — IOPAMIDOL (ISOVUE-300) INJECTION 61%
100.0000 mL | Freq: Once | INTRAVENOUS | Status: AC | PRN
  Administered 2016-12-14: 100 mL via INTRAVENOUS

## 2016-12-14 MED ORDER — MOMETASONE FURO-FORMOTEROL FUM 100-5 MCG/ACT IN AERO
2.0000 | INHALATION_SPRAY | Freq: Two times a day (BID) | RESPIRATORY_TRACT | Status: DC
  Administered 2016-12-14: 2 via RESPIRATORY_TRACT
  Filled 2016-12-14 (×2): qty 8.8

## 2016-12-14 MED ORDER — ONDANSETRON HCL 4 MG/2ML IJ SOLN
4.0000 mg | Freq: Once | INTRAMUSCULAR | Status: AC
  Administered 2016-12-14: 4 mg via INTRAVENOUS
  Filled 2016-12-14: qty 2

## 2016-12-14 MED ORDER — ALBUTEROL SULFATE HFA 108 (90 BASE) MCG/ACT IN AERS: 1.0000 | INHALATION_SPRAY | Freq: Four times a day (QID) | RESPIRATORY_TRACT | 0 refills | Status: DC | PRN

## 2016-12-14 MED ORDER — DOXYCYCLINE HYCLATE 100 MG PO TABS
100.0000 mg | ORAL_TABLET | Freq: Two times a day (BID) | ORAL | Status: DC
  Administered 2016-12-14: 100 mg via ORAL
  Filled 2016-12-14 (×7): qty 1

## 2016-12-14 MED ORDER — HYDROXYZINE HCL 50 MG/ML IM SOLN: 50.0000 mg | Freq: Once | INTRAMUSCULAR | Status: DC | PRN

## 2016-12-14 MED ORDER — IOPAMIDOL (ISOVUE-300) INJECTION 61%
INTRAVENOUS | Status: AC
  Filled 2016-12-14: qty 100

## 2016-12-14 MED ORDER — ALUM & MAG HYDROXIDE-SIMETH 200-200-20 MG/5ML PO SUSP: 30.0000 mL | ORAL | Status: DC | PRN

## 2016-12-14 MED ORDER — ALBUTEROL SULFATE HFA 108 (90 BASE) MCG/ACT IN AERS: 1.0000 | INHALATION_SPRAY | Freq: Four times a day (QID) | RESPIRATORY_TRACT | Status: DC | PRN

## 2016-12-14 MED ORDER — IPRATROPIUM BROMIDE 0.02 % IN SOLN
0.5000 mg | Freq: Once | RESPIRATORY_TRACT | Status: AC
  Administered 2016-12-14: 0.5 mg via RESPIRATORY_TRACT
  Filled 2016-12-14: qty 2.5

## 2016-12-14 MED ORDER — TRAZODONE HCL 50 MG PO TABS
50.0000 mg | ORAL_TABLET | Freq: Every evening | ORAL | Status: DC | PRN
  Administered 2016-12-15: 50 mg via ORAL
  Filled 2016-12-14: qty 1

## 2016-12-14 MED ORDER — DIVALPROEX SODIUM 500 MG PO DR TAB
500.0000 mg | DELAYED_RELEASE_TABLET | Freq: Two times a day (BID) | ORAL | Status: DC
  Administered 2016-12-14: 500 mg via ORAL
  Filled 2016-12-14 (×7): qty 1

## 2016-12-14 MED ORDER — ALBUTEROL (5 MG/ML) CONTINUOUS INHALATION SOLN
10.0000 mg/h | INHALATION_SOLUTION | Freq: Once | RESPIRATORY_TRACT | Status: AC
  Administered 2016-12-14: 10 mg/h via RESPIRATORY_TRACT
  Filled 2016-12-14: qty 20

## 2016-12-14 MED ORDER — ONDANSETRON 4 MG PO TBDP
8.0000 mg | ORAL_TABLET | Freq: Three times a day (TID) | ORAL | Status: DC | PRN
  Administered 2016-12-15 (×2): 8 mg via ORAL
  Filled 2016-12-14 (×3): qty 2

## 2016-12-14 MED ORDER — ALBUTEROL SULFATE (2.5 MG/3ML) 0.083% IN NEBU
5.0000 mg | INHALATION_SOLUTION | Freq: Once | RESPIRATORY_TRACT | Status: AC
  Administered 2016-12-14: 5 mg via RESPIRATORY_TRACT
  Filled 2016-12-14: qty 6

## 2016-12-14 MED ORDER — ACETAMINOPHEN 325 MG PO TABS
650.0000 mg | ORAL_TABLET | Freq: Four times a day (QID) | ORAL | Status: DC | PRN
  Administered 2016-12-15: 650 mg via ORAL
  Filled 2016-12-14: qty 2

## 2016-12-14 MED ORDER — PROMETHAZINE HCL 25 MG/ML IJ SOLN
25.0000 mg | Freq: Once | INTRAMUSCULAR | Status: AC
  Administered 2016-12-14: 25 mg via INTRAVENOUS
  Filled 2016-12-14: qty 1

## 2016-12-14 NOTE — ED Notes (Signed)
Bed: [facility] Expected date:  Expected time:  Means of arrival:  Comments: 

## 2016-12-14 NOTE — Discharge Instructions (Signed)
Prescription for nausea medication and inhaler. Clear liquids for the next 12 hours.

## 2016-12-14 NOTE — Progress Notes (Signed)
Patient returned from the Emergency Department.  Patient appears frail, weak but ambulating with walker.  Staff reports patient vomited prior to discharge from the ED.  Patient reports not feeling well.  Patient vomits watery emesis with blood noted.  Vital signs obtained and noted.  Patient is in bed resting.

## 2016-12-14 NOTE — Progress Notes (Signed)
  Endorses shortness of breath, unproductive cough, chills. Alert and oriented x 4. Respirations even with accessory muscle use. Rhonchi and wheezes throughout all lung fields. Temp 98.9, Pulse ox 100% on room air.  Ordered albuterol nebulizer.  2300: Continues to report shortness of breath and chills. Lung sounds not improved after nebulizer treatment. [facility]

## 2016-12-14 NOTE — ED Triage Notes (Signed)
[facility]  Per pt he has been vomiting all day.  Pt has rhonchi.  Pt c/o CP as well as abdominal pain.  Pt ambulatory to triage.

## 2016-12-14 NOTE — ED Notes (Signed)
[facility] transport called  

## 2016-12-14 NOTE — ED Provider Notes (Signed)
[facility]-EMERGENCY DEPT Provider Note   CSN: [id number] Arrival date & time: 12/10/16  1816  By signing my name below, I, Ny'Kea Lewis, attest that this documentation has been prepared under the direction and in the presence of Devoria Albe, MD. Electronically Signed: Karren Cobble, ED Scribe. 12/14/16. 2:24 AM.  Time seen 01:30 AM  History   Chief Complaint Chief Complaint  Patient presents with  . Shortness of Breath   The history is provided by the patient. No language interpreter was used.   HPI Comments: Francisco Beard is a 56 y.o. male who presents to the Emergency Department complaining of moderate, gradually worsening, shortness of breath that began today.He has a history of COPD and asthma. Pt notes associated nausea, vomiting, diarrhea, chest tightness, cough with no sputum production, left lower quadrant abdominal pain, and wheezing. He reports he has had four episodes of vomiting that began today, and at least eight episodes of diarrhea that he describes as watery that began yesterday. He also reports he has been using his inhaler with mild relief, but his chest tightness always returns after a period of time. PTA he had a nebulizer treatment with mild relief. Pt is on antibiotic for syphilis x three days  (? Doxycycline b/o penicillin allergy). He is nauseous at this time. Denies tobacco or alcohol usage. Denies fever or blood in stool.  PT was admitted to [facility] on July 9.   PCP none  Past Medical History:  Diagnosis Date  . Anxiety   . Arthritis    "hips; bottom part of my back" (07/09/2016)  . Asthmatic bronchitis   . Bipolar disorder (HCC)   . CHF (congestive heart failure) (HCC)   . Chronic bronchitis (HCC)   . Chronic hip pain   . COPD (chronic obstructive pulmonary disease) (HCC)   . Depression   . GERD (gastroesophageal reflux disease)   . High cholesterol   . History of hiatal hernia   . Hypertension   . Irregular heart beats   . Pneumonia    "3 times; it's been awhile"  (07/09/2016)  . Schizophrenia (HCC)   . Type II diabetes mellitus [facility])     Patient Active Problem List   Diagnosis Date Noted  . Schizoaffective disorder (HCC) 12/10/2016  . Acute respiratory failure (HCC) 11/04/2016  . Allergic rhinitis 11/04/2016  . Tobacco use disorder 11/04/2016  . BPH (benign prostatic hyperplasia) 11/04/2016  . Acute exacerbation of COPD with asthma (HCC) 07/09/2016  . Polysubstance abuse 07/09/2016  . Homicidal ideation   . HTN (hypertension) 04/17/2016  . Chest pain   . COPD exacerbation (HCC) 04/13/2016  . COPD (chronic obstructive pulmonary disease) (HCC) 04/13/2016  . Avascular necrosis of bone of right hip (HCC) 04/08/2016  . COPD with acute exacerbation (HCC) 04/01/2016  . Suicidal ideations 08/17/2013    Past Surgical History:  Procedure Laterality Date  . CLOSED REDUCTION HAND FRACTURE Right    "got 6 screws and a plate in there"  . ELBOW FRACTURE SURGERY Left    "put it through the wall"  . FRACTURE SURGERY      Home Medications    Prior to Admission medications   Medication Sig Start Date End Date Taking? Authorizing Provider  albuterol (PROVENTIL HFA;VENTOLIN HFA) 108 (90 Base) MCG/ACT inhaler Inhale 1-2 puffs into the lungs every 4 (four) hours as needed for wheezing or shortness of breath. 11/05/16   Nyra Market, MD  albuterol (PROVENTIL) (2.5 MG/3ML) 0.083% nebulizer solution Take 3 mLs (2.5 mg total)  by nebulization every 4 (four) hours as needed for wheezing or shortness of breath. 07/24/16   Rodolph Bong, MD  amLODipine (NORVASC) 5 MG tablet Take 1 tablet (5 mg total) by mouth daily. 11/05/16   Nyra Market, MD  Ascorbic Acid (VITAMIN C PO) Take 1 tablet by mouth daily.    [provider]  atorvastatin (LIPITOR) 20 MG tablet Take 20 mg by mouth daily.    [provider]  budesonide-formoterol (SYMBICORT) 80-4.5 MCG/ACT inhaler Inhale 2 puffs into the lungs 2 (two) times daily. 11/05/16   Nyra Market, MD    cholecalciferol (VITAMIN D) 1000 units tablet Take 1,000 Units by mouth 2 (two) times daily.    [provider]  divalproex (DEPAKOTE) 500 MG DR tablet Take 1 tablet (500 mg total) by mouth 2 (two) times daily. Patient not taking: Reported on 12/09/2016 11/05/16 12/05/16  Nyra Market, MD  divalproex (DEPAKOTE) 500 MG DR tablet Take 500 mg by mouth 2 (two) times daily.  10/14/16   [provider]  DULoxetine (CYMBALTA) 30 MG capsule Take 30 mg by mouth daily.    [provider]  famotidine (PEPCID AC) 10 MG chewable tablet Chew 10 mg by mouth 2 (two) times daily as needed for heartburn.    [provider]  ferrous gluconate (FERGON) 324 MG tablet Take 1 tablet (324 mg total) by mouth daily with breakfast. 11/05/16   Nyra Market, MD  finasteride (PROSCAR) 5 MG tablet Take 1 tablet (5 mg total) by mouth daily. 11/06/16   Nyra Market, MD  fluPHENAZine (PROLIXIN) 10 MG tablet Take 10-20 mg by mouth See admin instructions. Pt takes 10mg  in am, 20mg  in pm    [provider]  fluticasone (FLONASE) 50 MCG/ACT nasal spray Place 1 spray into both nostrils daily. 11/06/16   Nyra Market, MD  folic acid (FOLVITE) 1 MG tablet Take 1 mg by mouth daily.    [provider]  HYDROcodone-acetaminophen (NORCO/VICODIN) 5-325 MG tablet Take 1 tablet by mouth every 6 (six) hours as needed for moderate pain. Patient not taking: Reported on 12/09/2016 04/17/16   Rodolph Bong, MD  pantoprazole (PROTONIX) 40 MG tablet Take 1 tablet (40 mg total) by mouth 2 (two) times daily. 11/05/16   Nyra Market, MD  tamsulosin (FLOMAX) 0.4 MG CAPS capsule Take 1 capsule (0.4 mg total) by mouth daily. 11/06/16   Nyra Market, MD  theophylline (THEO-24) 300 MG 24 hr capsule Take 300 mg by mouth daily.    [provider]  thiamine 100 MG tablet Take 1 tablet (100 mg total) by mouth daily. 07/24/16   Rodolph Bong, MD  tiotropium (SPIRIVA HANDIHALER) 18 MCG  inhalation capsule Place 1 capsule (18 mcg total) into inhaler and inhale daily. 11/05/16   Nyra Market, MD   Family History Family History  Problem Relation Age of Onset  . Asthma Mother   . Heart murmur Sister   . Heart murmur Brother    Social History Social History  Substance Use Topics  . Smoking status: Heavy Tobacco Smoker    Packs/day: 0.50    Years: 41.00    Types: Cigarettes    Last attempt to quit: 04/08/2016  . Smokeless tobacco: Never Used  . Alcohol use 12.0 oz/week    20 Standard drinks or equivalent per week     Comment: 07/09/2016 "I had quit for 1 yr, relapsed 3 days ago"     Allergies   Aleve [naproxen]; Aspirin; Ibuprofen; Penicillins;  Seroquel [quetiapine]; Strawberry extract; Tomato; and Onion  Review of Systems Review of Systems  Constitutional: Negative for fever.  Respiratory: Positive for cough, chest tightness, shortness of breath and wheezing.   Gastrointestinal: Positive for diarrhea, nausea and vomiting. Negative for blood in stool.  All other systems reviewed and are negative.  Physical Exam Updated Vital Signs BP (!) 98/59 (BP Location: Right Arm)   Pulse 60   Temp 98.9 F (37.2 C) (Oral)   Resp 20   Ht 5\' 11"  (1.803 m)   Wt 180 lb (81.6 kg)   SpO2 98%   BMI 25.10 kg/m   Vital signs normal except borderline hypotension   Physical Exam  Constitutional: He is oriented to person, place, and time. He appears well-developed and well-nourished.  Non-toxic appearance. He does not appear ill. No distress.  HENT:  Head: Normocephalic and atraumatic.  Right Ear: External ear normal.  Left Ear: External ear normal.  Nose: Nose normal. No mucosal edema or rhinorrhea.  Mouth/Throat: Oropharynx is clear and moist. Mucous membranes are dry. No dental abscesses or uvula swelling.  Eyes: Pupils are equal, round, and reactive to light. Conjunctivae and EOM are normal.  Neck: Normal range of motion and full passive range of motion without pain.  Neck supple.  Cardiovascular: Normal rate, regular rhythm and normal heart sounds.  Exam reveals no gallop and no friction rub.   No murmur heard. Pulmonary/Chest: Accessory muscle usage present. Tachypnea noted. He is in respiratory distress. He has decreased breath sounds. He has no wheezes. He has rhonchi. He has no rales. He exhibits no tenderness and no crepitus.  Abdominal: Soft. Normal appearance and bowel sounds are normal. He exhibits no distension. There is tenderness in the left lower quadrant. There is no rebound and no guarding.  Musculoskeletal: Normal range of motion. He exhibits no edema or tenderness.  Moves all extremities well.   Neurological: He is alert and oriented to person, place, and time. He has normal strength. No cranial nerve deficit.  Skin: Skin is warm, dry and intact. No rash noted. No erythema. No pallor.  Psychiatric: He has a normal mood and affect. His speech is normal and behavior is normal. His mood appears not anxious.  Nursing note and vitals reviewed.   ED Treatments / Results  DIAGNOSTIC STUDIES: Oxygen Saturation is 97% on RA, adequate by my interpretation.   Labs (all labs ordered are listed, but only abnormal results are displayed)  Results for orders placed or performed during the hospital encounter of 12/10/16  Basic metabolic panel  Result Value Ref Range   Sodium 140 135 - 145 mmol/L   Potassium 3.5 3.5 - 5.1 mmol/L   Chloride 109 101 - 111 mmol/L   CO2 22 22 - 32 mmol/L   Glucose, Bld 97 65 - 99 mg/dL   BUN 10 6 - 20 mg/dL   Creatinine, Ser 1.610.84 0.61 - 1.24 mg/dL   Calcium 8.8 (L) 8.9 - 10.3 mg/dL   GFR calc non Af Amer >60 >60 mL/min   GFR calc Af Amer >60 >60 mL/min   Anion gap 9 5 - 15  CBC with Differential  Result Value Ref Range   WBC 6.1 4.0 - 10.5 K/uL   RBC 4.49 4.22 - 5.81 MIL/uL   Hemoglobin 13.2 13.0 - 17.0 g/dL   HCT 09.639.0 04.539.0 - 40.952.0 %   MCV 86.9 78.0 - 100.0 fL   MCH 29.4 26.0 - 34.0 pg   MCHC 33.8 30.0 -  36.0  g/dL   RDW 16.1 09.6 - 04.5 %   Platelets 249 150 - 400 K/uL   Neutrophils Relative % 58 %   Neutro Abs 3.5 1.7 - 7.7 K/uL   Lymphocytes Relative 31 %   Lymphs Abs 1.9 0.7 - 4.0 K/uL   Monocytes Relative 5 %   Monocytes Absolute 0.3 0.1 - 1.0 K/uL   Eosinophils Relative 6 %   Eosinophils Absolute 0.4 0.0 - 0.7 K/uL   Basophils Relative 0 %   Basophils Absolute 0.0 0.0 - 0.1 K/uL      EKG  EKG Interpretation  Date/Time:  Saturday December 14 2016 00:12:37 EDT Ventricular Rate:  56 PR Interval:    QRS Duration: 86 QT Interval:  442 QTC Calculation: 427 R Axis:   61 Text Interpretation:  Sinus rhythm Sinus bradycardia Since last tracing rate slower 04 Nov 2016 Baseline wander in lead(s) II III aVF Confirmed by Devoria Albe ([id number]) on 12/14/2016 12:40:04 AM      Radiology Dg Chest 2 View  Result Date: 12/14/2016 CLINICAL DATA:  Shortness of breath EXAM: CHEST  2 VIEW COMPARISON:  11/04/2016 FINDINGS: Coarse perihilar interstitial opacity slight increased from prior. No focal consolidation or effusion. Normal heart size. No pneumothorax. IMPRESSION: Coarse perihilar interstitial opacity could relate to bronchial inflammation. No focal pneumonia. Electronically Signed   By: Jasmine Pang M.D.   On: 12/14/2016 00:52    Procedures Procedures (including critical care time)  Medications Ordered in ED Medications  acetaminophen (TYLENOL) tablet 650 mg (650 mg Oral Given 12/13/16 2045)  alum & mag hydroxide-simeth (MAALOX/MYLANTA) 200-200-20 MG/5ML suspension 30 mL (30 mLs Oral Given 12/13/16 2037)  magnesium hydroxide (MILK OF MAGNESIA) suspension 30 mL (30 mLs Oral Given 12/13/16 0951)  traZODone (DESYREL) tablet 50 mg (50 mg Oral Given 12/13/16 2205)  divalproex (DEPAKOTE) DR tablet 500 mg (500 mg Oral Given 12/13/16 2037)  DULoxetine (CYMBALTA) DR capsule 30 mg (30 mg Oral Given 12/13/16 0949)  tamsulosin (FLOMAX) capsule 0.4 mg (0.4 mg Oral Given 12/13/16 0949)  albuterol (PROVENTIL  HFA;VENTOLIN HFA) 108 (90 Base) MCG/ACT inhaler 1-2 puff (1 puff Inhalation Given 12/13/16 2211)  doxycycline (VIBRA-TABS) tablet 100 mg (100 mg Oral Given 12/13/16 2037)  albuterol (PROVENTIL) (2.5 MG/3ML) 0.083% nebulizer solution 2.5 mg (2.5 mg Nebulization Given 12/13/16 2233)  tiotropium (SPIRIVA) inhalation capsule 18 mcg (not administered)  mometasone-formoterol (DULERA) 100-5 MCG/ACT inhaler 2 puff (2 puffs Inhalation Given 12/13/16 2232)  albuterol (PROVENTIL) (2.5 MG/3ML) 0.083% nebulizer solution 5 mg (5 mg Nebulization Not Given 12/14/16 0140)  iopamidol (ISOVUE-300) 61 % injection (not administered)  albuterol (PROVENTIL,VENTOLIN) solution continuous neb (10 mg/hr Nebulization Given 12/14/16 0148)  ipratropium (ATROVENT) nebulizer solution 0.5 mg (0.5 mg Nebulization Given 12/14/16 0148)  methylPREDNISolone sodium succinate (SOLU-MEDROL) 125 mg/2 mL injection 125 mg (125 mg Intravenous Given 12/14/16 0248)  sodium chloride 0.9 % bolus 500 mL (0 mLs Intravenous Stopped 12/14/16 0416)  sodium chloride 0.9 % bolus 1,000 mL (0 mLs Intravenous Stopped 12/14/16 0808)  ondansetron (ZOFRAN) injection 4 mg (4 mg Intravenous Given 12/14/16 0248)  iopamidol (ISOVUE-300) 61 % injection 100 mL (100 mLs Intravenous Contrast Given 12/14/16 0354)  ondansetron (ZOFRAN) injection 4 mg (4 mg Intravenous Given 12/14/16 0415)  metoCLOPramide (REGLAN) injection 10 mg (10 mg Intravenous Given 12/14/16 0708)  sodium chloride 0.9 % bolus 1,000 mL (0 mLs Intravenous Stopped 12/14/16 0808)    Initial Impression / Assessment and Plan / ED Course  I have reviewed the triage vital  signs and the nursing notes.  Pertinent labs & imaging results that were available during my care of the patient were reviewed by me and considered in my medical decision making (see chart for details).     COORDINATION OF CARE: 1:40 AM-Discussed next steps with pt. Pt verbalized understanding and is agreeable with the plan.   Patient was given  IV fluids, IV nausea medication. CT scan the abdomen was ordered to evaluate his complaints of vomiting and diarrhea with left lower quadrant abdominal pain for possible colitis.  Patient continued to have nausea and vomiting, the Zofran was repeated however he still had vomiting and he was then given Reglan.  Recheck at 5 AM patient was resting, I discussed his test results which shows his CT scan is without acute inflammation of his intestinal tract. Hopefully he will continue the IV fluids and nausea medication and will improve as he can go back to behavioral health to finish his treatment.  Recheck at 08:35 AM patient states his breathing is better however on exam he still has diffuse wheezing. He states he still has a lot of nausea. I'm going to try Bentyl. He was given another nebulizer treatment.  08:40 AM Dr Adriana Simas will recheck patient after change of shift.   Final Clinical Impressions(s) / ED Diagnoses   Final diagnoses:  Nausea vomiting and diarrhea  LLQ abdominal pain  Mild intermittent asthma with exacerbation    Disposition pending  Devoria Albe, MD, FACEP   I personally performed the services described in this documentation, which was scribed in my presence. The recorded information has been reviewed and considered.  Devoria Albe, MD, Concha Pyo, MD 12/14/16 [phone]

## 2016-12-15 ENCOUNTER — Encounter (HOSPITAL_COMMUNITY): Payer: Self-pay | Admitting: Emergency Medicine

## 2016-12-15 ENCOUNTER — Emergency Department (HOSPITAL_COMMUNITY): Payer: Medicaid Other

## 2016-12-15 ENCOUNTER — Inpatient Hospital Stay (HOSPITAL_COMMUNITY)
Admission: EM | Admit: 2016-12-15 | Discharge: 2016-12-17 | DRG: 194 | Disposition: A | Discharge status: Other Acute Inpatient Hospital | Payer: Medicaid Other | Attending: Internal Medicine | Admitting: Internal Medicine

## 2016-12-15 DIAGNOSIS — E785 Hyperlipidemia, unspecified: Secondary | ICD-10-CM | POA: Diagnosis present

## 2016-12-15 DIAGNOSIS — F419 Anxiety disorder, unspecified: Secondary | ICD-10-CM | POA: Diagnosis present

## 2016-12-15 DIAGNOSIS — F259 Schizoaffective disorder, unspecified: Principal | ICD-10-CM

## 2016-12-15 DIAGNOSIS — M479 Spondylosis, unspecified: Secondary | ICD-10-CM | POA: Diagnosis present

## 2016-12-15 DIAGNOSIS — J189 Pneumonia, unspecified organism: Secondary | ICD-10-CM

## 2016-12-15 DIAGNOSIS — Z825 Family history of asthma and other chronic lower respiratory diseases: Secondary | ICD-10-CM | POA: Diagnosis not present

## 2016-12-15 DIAGNOSIS — J9811 Atelectasis: Secondary | ICD-10-CM | POA: Diagnosis present

## 2016-12-15 DIAGNOSIS — R45850 Homicidal ideations: Secondary | ICD-10-CM | POA: Diagnosis not present

## 2016-12-15 DIAGNOSIS — J440 Chronic obstructive pulmonary disease with acute lower respiratory infection: Secondary | ICD-10-CM | POA: Diagnosis present

## 2016-12-15 DIAGNOSIS — Y95 Nosocomial condition: Secondary | ICD-10-CM | POA: Diagnosis present

## 2016-12-15 DIAGNOSIS — F319 Bipolar disorder, unspecified: Secondary | ICD-10-CM | POA: Diagnosis present

## 2016-12-15 DIAGNOSIS — Z79899 Other long term (current) drug therapy: Secondary | ICD-10-CM

## 2016-12-15 DIAGNOSIS — F1910 Other psychoactive substance abuse, uncomplicated: Secondary | ICD-10-CM | POA: Diagnosis not present

## 2016-12-15 DIAGNOSIS — G8929 Other chronic pain: Secondary | ICD-10-CM | POA: Diagnosis present

## 2016-12-15 DIAGNOSIS — Z888 Allergy status to other drugs, medicaments and biological substances status: Secondary | ICD-10-CM

## 2016-12-15 DIAGNOSIS — J441 Chronic obstructive pulmonary disease with (acute) exacerbation: Secondary | ICD-10-CM | POA: Diagnosis present

## 2016-12-15 DIAGNOSIS — Z880 Allergy status to penicillin: Secondary | ICD-10-CM

## 2016-12-15 DIAGNOSIS — M1611 Unilateral primary osteoarthritis, right hip: Secondary | ICD-10-CM | POA: Diagnosis present

## 2016-12-15 DIAGNOSIS — K5900 Constipation, unspecified: Secondary | ICD-10-CM | POA: Diagnosis present

## 2016-12-15 DIAGNOSIS — F1490 Cocaine use, unspecified, uncomplicated: Secondary | ICD-10-CM

## 2016-12-15 DIAGNOSIS — E119 Type 2 diabetes mellitus without complications: Secondary | ICD-10-CM | POA: Diagnosis present

## 2016-12-15 DIAGNOSIS — J449 Chronic obstructive pulmonary disease, unspecified: Secondary | ICD-10-CM | POA: Diagnosis present

## 2016-12-15 DIAGNOSIS — K219 Gastro-esophageal reflux disease without esophagitis: Secondary | ICD-10-CM | POA: Diagnosis present

## 2016-12-15 DIAGNOSIS — Z7951 Long term (current) use of inhaled steroids: Secondary | ICD-10-CM

## 2016-12-15 DIAGNOSIS — F17210 Nicotine dependence, cigarettes, uncomplicated: Secondary | ICD-10-CM | POA: Diagnosis present

## 2016-12-15 DIAGNOSIS — R45851 Suicidal ideations: Secondary | ICD-10-CM | POA: Diagnosis not present

## 2016-12-15 DIAGNOSIS — Z91018 Allergy to other foods: Secondary | ICD-10-CM

## 2016-12-15 DIAGNOSIS — F251 Schizoaffective disorder, depressive type: Secondary | ICD-10-CM | POA: Diagnosis not present

## 2016-12-15 DIAGNOSIS — F17200 Nicotine dependence, unspecified, uncomplicated: Secondary | ICD-10-CM | POA: Diagnosis present

## 2016-12-15 DIAGNOSIS — E7800 Pure hypercholesterolemia, unspecified: Secondary | ICD-10-CM | POA: Diagnosis present

## 2016-12-15 DIAGNOSIS — I10 Essential (primary) hypertension: Secondary | ICD-10-CM | POA: Diagnosis present

## 2016-12-15 DIAGNOSIS — K5730 Diverticulosis of large intestine without perforation or abscess without bleeding: Secondary | ICD-10-CM | POA: Diagnosis present

## 2016-12-15 DIAGNOSIS — R1013 Epigastric pain: Secondary | ICD-10-CM | POA: Diagnosis not present

## 2016-12-15 DIAGNOSIS — N400 Enlarged prostate without lower urinary tract symptoms: Secondary | ICD-10-CM | POA: Diagnosis present

## 2016-12-15 DIAGNOSIS — M25559 Pain in unspecified hip: Secondary | ICD-10-CM | POA: Diagnosis present

## 2016-12-15 DIAGNOSIS — M879 Osteonecrosis, unspecified: Secondary | ICD-10-CM | POA: Diagnosis present

## 2016-12-15 DIAGNOSIS — Z8701 Personal history of pneumonia (recurrent): Secondary | ICD-10-CM | POA: Diagnosis not present

## 2016-12-15 DIAGNOSIS — Z886 Allergy status to analgesic agent status: Secondary | ICD-10-CM

## 2016-12-15 LAB — COMPREHENSIVE METABOLIC PANEL
ALBUMIN: 3.7 g/dL (ref 3.5–5.0)
ALT: 23 U/L (ref 17–63)
ANION GAP: 6 (ref 5–15)
AST: 26 U/L (ref 15–41)
Alkaline Phosphatase: 52 U/L (ref 38–126)
BUN: 10 mg/dL (ref 6–20)
CHLORIDE: 111 mmol/L (ref 101–111)
CO2: 23 mmol/L (ref 22–32)
Calcium: 8.8 mg/dL — ABNORMAL LOW (ref 8.9–10.3)
Creatinine, Ser: 0.97 mg/dL (ref 0.61–1.24)
GFR calc Af Amer: >60 mL/min (ref >60)
GFR calc non Af Amer: >60 mL/min (ref >60)
GLUCOSE: 90 mg/dL (ref 65–99)
POTASSIUM: 3.9 mmol/L (ref 3.5–5.1)
SODIUM: 140 mmol/L (ref 135–145)
Total Bilirubin: 0.5 mg/dL (ref 0.3–1.2)
Total Protein: 6.6 g/dL (ref 6.5–8.1)

## 2016-12-15 LAB — URINALYSIS, ROUTINE W REFLEX MICROSCOPIC
Bilirubin Urine: NEGATIVE
GLUCOSE, UA: NEGATIVE mg/dL
Hgb urine dipstick: NEGATIVE
KETONES UR: NEGATIVE mg/dL
Leukocytes, UA: NEGATIVE
NITRITE: NEGATIVE
PH: 7 (ref 5.0–8.0)
Protein, ur: NEGATIVE mg/dL
SPECIFIC GRAVITY, URINE: 1.013 (ref 1.005–1.030)

## 2016-12-15 LAB — LIPASE, BLOOD: Lipase: 14 U/L (ref 11–51)

## 2016-12-15 LAB — CBC WITH DIFFERENTIAL/PLATELET
BASOS ABS: 0 10*3/uL (ref 0.0–0.1)
BASOS PCT: 0 %
EOS ABS: 0 10*3/uL (ref 0.0–0.7)
Eosinophils Relative: 0 %
HEMATOCRIT: 37.8 % — AB (ref 39.0–52.0)
Hemoglobin: 12.7 g/dL — ABNORMAL LOW (ref 13.0–17.0)
Lymphocytes Relative: 18 %
Lymphs Abs: 1.3 10*3/uL (ref 0.7–4.0)
MCH: 28.7 pg (ref 26.0–34.0)
MCHC: 33.6 g/dL (ref 30.0–36.0)
MCV: 85.5 fL (ref 78.0–100.0)
MONO ABS: 0.8 10*3/uL (ref 0.1–1.0)
MONOS PCT: 11 %
NEUTROS ABS: 5 10*3/uL (ref 1.7–7.7)
NEUTROS PCT: 71 %
Platelets: 242 10*3/uL (ref 150–400)
RBC: 4.42 MIL/uL (ref 4.22–5.81)
RDW: 15.5 % (ref 11.5–15.5)
WBC: 7.1 10*3/uL (ref 4.0–10.5)

## 2016-12-15 MED ORDER — DIPHENHYDRAMINE HCL 50 MG/ML IJ SOLN
12.5000 mg | Freq: Once | INTRAMUSCULAR | Status: AC
  Administered 2016-12-15: 12.5 mg via INTRAVENOUS
  Filled 2016-12-15: qty 1

## 2016-12-15 MED ORDER — SODIUM CHLORIDE 0.9 % IV BOLUS (SEPSIS)
2000.0000 mL | Freq: Once | INTRAVENOUS | Status: AC
Start: 2016-12-15 — End: 2016-12-15
  Administered 2016-12-15: 2000 mL via INTRAVENOUS

## 2016-12-15 MED ORDER — PANTOPRAZOLE SODIUM 40 MG IV SOLR
40.0000 mg | Freq: Two times a day (BID) | INTRAVENOUS | Status: DC
  Administered 2016-12-16 (×3): 40 mg via INTRAVENOUS
  Filled 2016-12-15 (×3): qty 40

## 2016-12-15 MED ORDER — MOMETASONE FURO-FORMOTEROL FUM 100-5 MCG/ACT IN AERO
2.0000 | INHALATION_SPRAY | Freq: Two times a day (BID) | RESPIRATORY_TRACT | Status: DC
Start: 2016-12-15 — End: 2016-12-17
  Administered 2016-12-16 – 2016-12-17 (×3): 2 via RESPIRATORY_TRACT
  Filled 2016-12-15: qty 8.8

## 2016-12-15 MED ORDER — DIVALPROEX SODIUM 250 MG PO DR TAB
500.0000 mg | DELAYED_RELEASE_TABLET | Freq: Two times a day (BID) | ORAL | Status: DC
  Administered 2016-12-16 – 2016-12-17 (×4): 500 mg via ORAL
  Filled 2016-12-15 (×4): qty 2

## 2016-12-15 MED ORDER — METOCLOPRAMIDE HCL 5 MG/ML IJ SOLN
10.0000 mg | Freq: Once | INTRAMUSCULAR | Status: AC
  Administered 2016-12-15: 10 mg via INTRAVENOUS
  Filled 2016-12-15: qty 2

## 2016-12-15 MED ORDER — AZTREONAM 2 G IJ SOLR
2.0000 g | Freq: Once | INTRAMUSCULAR | Status: AC
  Administered 2016-12-15: 2 g via INTRAVENOUS
  Filled 2016-12-15: qty 2

## 2016-12-15 MED ORDER — THEOPHYLLINE ER 300 MG PO TB12
150.0000 mg | ORAL_TABLET | Freq: Two times a day (BID) | ORAL | Status: DC
  Administered 2016-12-16 – 2016-12-17 (×4): 150 mg via ORAL
  Filled 2016-12-15 (×4): qty 1

## 2016-12-15 MED ORDER — DEXTROSE 5 % IV SOLN
1.0000 g | Freq: Three times a day (TID) | INTRAVENOUS | Status: DC
  Administered 2016-12-16 (×2): 1 g via INTRAVENOUS
  Filled 2016-12-15 (×3): qty 1

## 2016-12-15 MED ORDER — VITAMIN B-1 100 MG PO TABS
100.0000 mg | ORAL_TABLET | Freq: Every day | ORAL | Status: DC
  Administered 2016-12-16 – 2016-12-17 (×3): 100 mg via ORAL
  Filled 2016-12-15 (×3): qty 1

## 2016-12-15 MED ORDER — ATORVASTATIN CALCIUM 10 MG PO TABS
20.0000 mg | ORAL_TABLET | Freq: Every day | ORAL | Status: DC
  Administered 2016-12-16 (×2): 20 mg via ORAL
  Filled 2016-12-15 (×2): qty 2

## 2016-12-15 MED ORDER — ALBUTEROL SULFATE (2.5 MG/3ML) 0.083% IN NEBU
5.0000 mg | INHALATION_SOLUTION | Freq: Once | RESPIRATORY_TRACT | Status: AC
  Administered 2016-12-15: 5 mg via RESPIRATORY_TRACT
  Filled 2016-12-15: qty 6

## 2016-12-15 MED ORDER — ONDANSETRON HCL 4 MG/2ML IJ SOLN
4.0000 mg | Freq: Four times a day (QID) | INTRAMUSCULAR | Status: DC | PRN
  Administered 2016-12-16: 4 mg via INTRAVENOUS
  Filled 2016-12-15: qty 2

## 2016-12-15 MED ORDER — MORPHINE SULFATE (PF) 2 MG/ML IV SOLN
2.0000 mg | Freq: Once | INTRAVENOUS | Status: AC
  Administered 2016-12-16: 2 mg via INTRAVENOUS
  Filled 2016-12-15: qty 1

## 2016-12-15 MED ORDER — VANCOMYCIN HCL 10 G IV SOLR
2000.0000 mg | Freq: Once | INTRAVENOUS | Status: AC
  Administered 2016-12-16: 2000 mg via INTRAVENOUS
  Filled 2016-12-15: qty 2000

## 2016-12-15 MED ORDER — TAMSULOSIN HCL 0.4 MG PO CAPS
0.4000 mg | ORAL_CAPSULE | Freq: Every day | ORAL | Status: DC
  Administered 2016-12-16 – 2016-12-17 (×3): 0.4 mg via ORAL
  Filled 2016-12-15 (×3): qty 1

## 2016-12-15 MED ORDER — FAMOTIDINE 10 MG PO TABS
10.0000 mg | ORAL_TABLET | Freq: Two times a day (BID) | ORAL | Status: DC | PRN
  Filled 2016-12-15: qty 1

## 2016-12-15 MED ORDER — IPRATROPIUM BROMIDE 0.02 % IN SOLN
0.5000 mg | Freq: Once | RESPIRATORY_TRACT | Status: AC
  Administered 2016-12-15: 0.5 mg via RESPIRATORY_TRACT
  Filled 2016-12-15: qty 2.5

## 2016-12-15 MED ORDER — THEOPHYLLINE ER 300 MG PO CP24: 300.0000 mg | ORAL_CAPSULE | Freq: Every day | ORAL | Status: DC

## 2016-12-15 MED ORDER — ALBUTEROL SULFATE (2.5 MG/3ML) 0.083% IN NEBU
2.5000 mg | INHALATION_SOLUTION | RESPIRATORY_TRACT | Status: DC | PRN
  Administered 2016-12-16: 2.5 mg via RESPIRATORY_TRACT
  Filled 2016-12-15: qty 3

## 2016-12-15 MED ORDER — VITAMIN D3 25 MCG (1000 UNIT) PO TABS
1000.0000 [IU] | ORAL_TABLET | Freq: Two times a day (BID) | ORAL | Status: DC
  Administered 2016-12-16 – 2016-12-17 (×4): 1000 [IU] via ORAL
  Filled 2016-12-15 (×4): qty 1

## 2016-12-15 MED ORDER — IPRATROPIUM BROMIDE 0.02 % IN SOLN: 0.5000 mg | Freq: Four times a day (QID) | RESPIRATORY_TRACT | Status: DC

## 2016-12-15 MED ORDER — DULOXETINE HCL 30 MG PO CPEP
30.0000 mg | ORAL_CAPSULE | Freq: Every day | ORAL | Status: DC
  Administered 2016-12-16 – 2016-12-17 (×3): 30 mg via ORAL
  Filled 2016-12-15 (×3): qty 1

## 2016-12-15 MED ORDER — LORAZEPAM 2 MG/ML IJ SOLN: 1.0000 mg | INTRAMUSCULAR | Status: DC | PRN

## 2016-12-15 MED ORDER — SODIUM CHLORIDE 0.9 % IV BOLUS (SEPSIS): 1000.0000 mL | Freq: Once | INTRAVENOUS | Status: DC

## 2016-12-15 MED ORDER — ALBUTEROL SULFATE (2.5 MG/3ML) 0.083% IN NEBU
2.5000 mg | INHALATION_SOLUTION | Freq: Four times a day (QID) | RESPIRATORY_TRACT | Status: DC
  Filled 2016-12-15: qty 3

## 2016-12-15 MED ORDER — VANCOMYCIN HCL IN DEXTROSE 1-5 GM/200ML-% IV SOLN
1000.0000 mg | Freq: Two times a day (BID) | INTRAVENOUS | Status: DC
  Administered 2016-12-16: 1000 mg via INTRAVENOUS

## 2016-12-15 MED ORDER — AMLODIPINE BESYLATE 5 MG PO TABS
5.0000 mg | ORAL_TABLET | Freq: Every day | ORAL | Status: DC
  Administered 2016-12-16 – 2016-12-17 (×3): 5 mg via ORAL
  Filled 2016-12-15 (×3): qty 1

## 2016-12-15 MED ORDER — FINASTERIDE 5 MG PO TABS
5.0000 mg | ORAL_TABLET | Freq: Every day | ORAL | Status: DC
  Administered 2016-12-16 – 2016-12-17 (×3): 5 mg via ORAL
  Filled 2016-12-15 (×3): qty 1

## 2016-12-15 MED ORDER — VANCOMYCIN HCL IN DEXTROSE 1-5 GM/200ML-% IV SOLN: 1000.0000 mg | Freq: Once | INTRAVENOUS | Status: DC

## 2016-12-15 MED ORDER — SODIUM CHLORIDE 0.9 % IV SOLN
INTRAVENOUS | Status: DC
  Administered 2016-12-15 – 2016-12-17 (×3): via INTRAVENOUS

## 2016-12-15 NOTE — Progress Notes (Addendum)
Patient has not shown any improvement in his symptoms of nausea, vomiting since returning from the ED. Writer has been giving prn to help keep patient comfortable. He reports that he was told by the doctor that there was nothing else he could do for him. Fluids given to help with hydration bit he continues to vomit it back up. Will continue to monitor effectiveness of medications given. Safety maintained with 15 min checks.

## 2016-12-15 NOTE — Progress Notes (Signed)
Patient ID: Francisco Beard, male   DOB: 1961-03-05, 56 y.o.   MRN: [id number]  Writer spoke with NP Tanika L and patient is to be sent to [facility]-ED for evaluation. Patient reports his last bowel movement was three days ago. He reports tenderness upon palpation of his abdomen. He emesis is greenish, spit. It is blood tinged as well. His respirations become somewhat labored when speaking with this Academic librarianwriter and Charge Nurse Patrice. Patient is able to speak but appears lethargic and drowsy. Writer called non-emergent EMS for transport and [facility]-ED charge nurse was contacted as well.

## 2016-12-15 NOTE — ED Notes (Signed)
ED Provider at bedside. 

## 2016-12-15 NOTE — Progress Notes (Signed)
Patient arrived to room 1504, sitter order showing for suicide, safety but no sitter is with patient. Contacted ED nurse Freida BusmanAllen who is unsure and reports no sitter down in ED and reported previously in report that patient was from home. [facility] Contacted Dr. Robb Matarrtiz for clarification for sitter order. Spoke with Dr. Robb Matarrtiz who [facility] and was admitted for suicidal, homicidal and safety issues towards others. States patient should have SI sitter. Will notify house supervisor for further assistance.

## 2016-12-15 NOTE — [facility] Group Notes (Signed)
[facility] Group Notes: (Clinical Social Work)   12/15/2016      Type of Therapy:  Group Therapy   Participation Level:  Did Not Attend despite MHT prompting   Ambrose MantleMareida Grossman-Orr, LCSW 12/15/2016, 12:23 PM

## 2016-12-15 NOTE — ED Notes (Addendum)
Francisco M RN MADE AWARE OF LEFT HAND INCREASED SWELLING, REDNESS WITHOUT HEAT OR DRAINAGE.

## 2016-12-15 NOTE — ED Notes (Signed)
LEFT HAND PT STATES "IV WAS THERE YESTERDAY APPEARS SWOLLEN, RED" NO DRAINAGE AND NOT HOT. EDP ALLEN MADE AWARE OF THESE FINDINGS

## 2016-12-15 NOTE — ED Triage Notes (Addendum)
Per [facility]/o upper abdominal pain with N/V x2 days. Seen yesterday for same. Reports decreased appetite. States last BM 3 days ago.   8mg  [facility]an [facility] 11:14 [facility]  BP 118/70 HR 61 CBG 78

## 2016-12-15 NOTE — ED Notes (Signed)
Bed: [id number] Expected date:  Expected time:  Means of arrival:  Comments: EMS [facility] transfer - nausea/vomiting seen yesterday for same

## 2016-12-15 NOTE — ED Provider Notes (Signed)
[facility]-EMERGENCY DEPT Provider Note   CSN: [id number] Arrival date & time: 12/15/16  1530     History   Chief Complaint Chief Complaint  Patient presents with  . Abdominal Pain    HPI Francisco Beard is a 56 y.o. male.  56 year old male presents with worsening epigastric abdominal pain with associated nausea and vomiting with some blood-tinged emesis. No fever or chills. Was seen here recently for similar symptoms several hours ago and had abdominal CT which did not show any acute findings. Was given symptomatically treated with IV fluids anti-medics. He presents due to feeling profoundly weak. He does not some shortness of breath and does have a history of asthma. States that he is not concerned for aspiration. Denies any black or bloody stools. Was transferred from behavioral health for further evaluation      Past Medical History:  Diagnosis Date  . Anxiety   . Arthritis    "hips; bottom part of my back" (07/09/2016)  . Asthmatic bronchitis   . Bipolar disorder (HCC)   . CHF (congestive heart failure) (HCC)   . Chronic bronchitis (HCC)   . Chronic hip pain   . COPD (chronic obstructive pulmonary disease) (HCC)   . Depression   . GERD (gastroesophageal reflux disease)   . High cholesterol   . History of hiatal hernia   . Hypertension   . Irregular heart beats   . Pneumonia    "3 times; it's been awhile" (07/09/2016)  . Schizophrenia (HCC)   . Type II diabetes mellitus [facility](HCC)     Patient Active Problem List   Diagnosis Date Noted  . Schizoaffective disorder (HCC) 12/10/2016  . Acute respiratory failure (HCC) 11/04/2016  . Allergic rhinitis 11/04/2016  . Tobacco use disorder 11/04/2016  . BPH (benign prostatic hyperplasia) 11/04/2016  . Acute exacerbation of COPD with asthma (HCC) 07/09/2016  . Polysubstance abuse 07/09/2016  . Homicidal ideation   . HTN (hypertension) 04/17/2016  . Chest pain   . COPD exacerbation (HCC) 04/13/2016  . COPD (chronic obstructive  pulmonary disease) (HCC) 04/13/2016  . Avascular necrosis of bone of right hip (HCC) 04/08/2016  . COPD with acute exacerbation (HCC) 04/01/2016  . Suicidal ideations 08/17/2013    Past Surgical History:  Procedure Laterality Date  . CLOSED REDUCTION HAND FRACTURE Right    "got 6 screws and a plate in there"  . ELBOW FRACTURE SURGERY Left    "put it through the wall"  . FRACTURE SURGERY         Home Medications    Prior to Admission medications   Medication Sig Start Date End Date Taking? Authorizing Provider  albuterol (PROVENTIL HFA;VENTOLIN HFA) 108 (90 Base) MCG/ACT inhaler Inhale 1-2 puffs into the lungs every 6 (six) hours as needed for wheezing or shortness of breath. 12/14/16  Yes Donnetta Hutchingook, Brian, MD  amLODipine (NORVASC) 5 MG tablet Take 1 tablet (5 mg total) by mouth daily. 11/05/16  Yes Nyra MarketSvalina, Gorica, MD  Ascorbic Acid (VITAMIN C PO) Take 1 tablet by mouth daily.   Yes [provider]  atorvastatin (LIPITOR) 20 MG tablet Take 20 mg by mouth daily.   Yes [provider]  budesonide-formoterol (SYMBICORT) 80-4.5 MCG/ACT inhaler Inhale 2 puffs into the lungs 2 (two) times daily. 11/05/16  Yes Nyra MarketSvalina, Gorica, MD  cholecalciferol (VITAMIN D) 1000 units tablet Take 1,000 Units by mouth 2 (two) times daily.   Yes [provider]  DULoxetine (CYMBALTA) 30 MG capsule Take 30 mg by mouth daily.  Yes [provider]  ferrous gluconate (FERGON) 324 MG tablet Take 1 tablet (324 mg total) by mouth daily with breakfast. 11/05/16  Yes Nyra Market, MD  finasteride (PROSCAR) 5 MG tablet Take 1 tablet (5 mg total) by mouth daily. 11/06/16  Yes Nyra Market, MD  fluPHENAZine (PROLIXIN) 10 MG tablet Take 10-20 mg by mouth See admin instructions. Pt takes 10mg  in am, 20mg  in pm   Yes [provider]  fluticasone (FLONASE) 50 MCG/ACT nasal spray Place 1 spray into both nostrils daily. 11/06/16  Yes Nyra Market, MD  folic acid (FOLVITE) 1 MG tablet  Take 1 mg by mouth daily.   Yes [provider]  pantoprazole (PROTONIX) 40 MG tablet Take 1 tablet (40 mg total) by mouth 2 (two) times daily. 11/05/16  Yes Nyra Market, MD  tamsulosin (FLOMAX) 0.4 MG CAPS capsule Take 1 capsule (0.4 mg total) by mouth daily. 11/06/16  Yes Nyra Market, MD  theophylline (THEO-24) 300 MG 24 hr capsule Take 300 mg by mouth daily.   Yes [provider]  thiamine 100 MG tablet Take 1 tablet (100 mg total) by mouth daily. 07/24/16  Yes Rodolph Bong, MD  tiotropium (SPIRIVA HANDIHALER) 18 MCG inhalation capsule Place 1 capsule (18 mcg total) into inhaler and inhale daily. 11/05/16  Yes Nyra Market, MD  divalproex (DEPAKOTE) 500 MG DR tablet Take 1 tablet (500 mg total) by mouth 2 (two) times daily. Patient not taking: Reported on 12/09/2016 11/05/16 12/05/16  Nyra Market, MD  divalproex (DEPAKOTE) 500 MG DR tablet Take 500 mg by mouth 2 (two) times daily.  10/14/16   [provider]  famotidine (PEPCID AC) 10 MG chewable tablet Chew 10 mg by mouth 2 (two) times daily as needed for heartburn.    [provider]  HYDROcodone-acetaminophen (NORCO/VICODIN) 5-325 MG tablet Take 1 tablet by mouth every 6 (six) hours as needed for moderate pain. Patient not taking: Reported on 12/09/2016 04/17/16   Rodolph Bong, MD  ondansetron (ZOFRAN ODT) 8 MG disintegrating tablet Take 1 tablet (8 mg total) by mouth every 8 (eight) hours as needed for nausea or vomiting. 12/14/16   Donnetta Hutching, MD    Family History Family History  Problem Relation Age of Onset  . Asthma Mother   . Heart murmur Sister   . Heart murmur Brother     Social History Social History  Substance Use Topics  . Smoking status: Heavy Tobacco Smoker    Packs/day: 0.50    Years: 41.00    Types: Cigarettes    Last attempt to quit: 04/08/2016  . Smokeless tobacco: Never Used  . Alcohol use 12.0 oz/week    20 Standard drinks or equivalent per week     Comment:  07/09/2016 "I had quit for 1 yr, relapsed 3 days ago"     Allergies   Aleve [naproxen]; Aspirin; Ibuprofen; Penicillins; Seroquel [quetiapine]; Strawberry extract; Tomato; and Onion   Review of Systems Review of Systems  All other systems reviewed and are negative.    Physical Exam Updated Vital Signs There were no vitals taken for this visit.  Physical Exam  Constitutional: He is oriented to person, place, and time. He appears well-developed and well-nourished.  Non-toxic appearance. No distress.  HENT:  Head: Normocephalic and atraumatic.  Eyes: Pupils are equal, round, and reactive to light. Conjunctivae, EOM and lids are normal.  Neck: Normal range of motion. Neck supple. No tracheal deviation present. No thyroid mass present.  Cardiovascular:  Normal rate, regular rhythm and normal heart sounds.  Exam reveals no gallop.   No murmur heard. Pulmonary/Chest: No stridor. Tachypnea noted. He has decreased breath sounds in the right lower field and the left lower field. He has wheezes in the right lower field and the left lower field. He has rhonchi in the right lower field and the left lower field. He has no rales.  Abdominal: Soft. Normal appearance and bowel sounds are normal. He exhibits no distension. There is no tenderness. There is no rebound and no CVA tenderness.  Musculoskeletal: Normal range of motion. He exhibits no edema or tenderness.  Neurological: He is alert and oriented to person, place, and time. He has normal strength. No cranial nerve deficit or sensory deficit. GCS eye subscore is 4. GCS verbal subscore is 5. GCS motor subscore is 6.  Skin: Skin is warm and dry. No abrasion and no rash noted.  Psychiatric: He has a normal mood and affect. His speech is normal and behavior is normal.  Nursing note and vitals reviewed.    ED Treatments / Results  Labs (all labs ordered are listed, but only abnormal results are displayed) Labs Reviewed  CBC WITH  DIFFERENTIAL/PLATELET  COMPREHENSIVE METABOLIC PANEL  LIPASE, BLOOD  URINALYSIS, ROUTINE W REFLEX MICROSCOPIC    EKG  EKG Interpretation None       Radiology Dg Chest 2 View  Result Date: 12/14/2016 CLINICAL DATA:  Shortness of breath EXAM: CHEST  2 VIEW COMPARISON:  11/04/2016 FINDINGS: Coarse perihilar interstitial opacity slight increased from prior. No focal consolidation or effusion. Normal heart size. No pneumothorax. IMPRESSION: Coarse perihilar interstitial opacity could relate to bronchial inflammation. No focal pneumonia. Electronically Signed   By: Jasmine Pang M.D.   On: 12/14/2016 00:52   Ct Abdomen Pelvis W Contrast  Result Date: 12/14/2016 CLINICAL DATA:  56 year old male with vomiting and diarrhea. Patient is on antibiotic. EXAM: CT ABDOMEN AND PELVIS WITH CONTRAST TECHNIQUE: Multidetector CT imaging of the abdomen and pelvis was performed using the standard protocol following bolus administration of intravenous contrast. CONTRAST:  ISOVUE-300 IOPAMIDOL (ISOVUE-300) INJECTION 61% COMPARISON:  CT dated 08/11/2016 FINDINGS: Lower chest: Right lung base subsegmental atelectasis/ scarring. Infiltrate is less likely but not excluded. Clinical correlation is recommended. No intra-abdominal free air or free fluid. Hepatobiliary: No focal liver abnormality is seen. No gallstones, gallbladder wall thickening, or biliary dilatation. Pancreas: Unremarkable. No pancreatic ductal dilatation or surrounding inflammatory changes. Spleen: Normal in size without focal abnormality. Adrenals/Urinary Tract: The adrenal glands are unremarkable. There is a punctate nonobstructing left renal upper pole stone. No hydronephrosis. The right kidney is unremarkable. There is diffuse thickened appearance the bladder wall. Correlation with urinalysis recommended to exclude cystitis. Stomach/Bowel: Small hiatal hernia. There is sigmoid diverticulosis with muscular hypertrophy. No active inflammatory  changes. There is no evidence of bowel obstruction or active inflammation. Normal appendix. Vascular/Lymphatic: The abdominal aorta and IVC appear unremarkable. The origins of the celiac axis, SMA, IMA and the origins of the renal arteries are patent. The SMV, splenic vein, and main portal vein are patent. No portal venous gas identified. There is no adenopathy. Reproductive: The prostate and seminal vesicles are grossly unremarkable. Other: None Musculoskeletal: Mild degenerate changes of the spine. Disc desiccation with vacuum phenomena at L4-L5. Severe right hip osteoarthritis with loss of superior joint space and bone-on-bone contact. There is large sub cortical cysts with mild flattening of the superior femoral head. Linear sclerotic density in the left femoral head may represent  chronic changes or related to avascular necrosis. No acute fracture. IMPRESSION: 1. Sigmoid diverticulosis. No evidence of bowel obstruction or active inflammation. Normal appendix. 2. Diffuse thickening and trabecular appearance of the bladder wall may be related to chronic bladder outlet obstruction. Correlation with urinalysis recommended to exclude UTI. 3. Punctate nonobstructing left renal upper pole stone. No hydronephrosis. 4. Right lung base atelectasis/scarring versus less likely pneumonia. 5. 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 No acute fracture. Electronically Signed   By: Elgie Collard M.D.   On: 12/14/2016 04:28    Procedures Procedures (including critical care time)  Medications Ordered in ED Medications  0.9 %  sodium chloride infusion (not administered)  metoCLOPramide (REGLAN) injection 10 mg (not administered)  diphenhydrAMINE (BENADRYL) injection 12.5 mg (not administered)  sodium chloride 0.9 % bolus 2,000 mL (not administered)     Initial Impression / Assessment and Plan / ED Course  I have reviewed the  triage vital signs and the nursing notes.  Pertinent labs & imaging results that were available during my care of the patient were reviewed by me and considered in my medical decision making (see chart for details).     Patient with insulin-dependent to an x-ray wheezing that was treated with albuterol. Has low-grade temperature and suspect that he has pneumonia. Chest x-ray is concerning for possibly early pneumonia. He is short of breath at this time. Will IV hydrate and start on antibiotics and admitted for observation  Final Clinical Impressions(s) / ED Diagnoses   Final diagnoses:  None    New Prescriptions New Prescriptions   No medications on file     Lorre Nick, MD 12/15/16 1910

## 2016-12-15 NOTE — Progress Notes (Signed)
Patient ID: Leanord AsalJohn Mikles, male   DOB: [birth year], 58 y.o.   MRN: [id number]  Patient is currently laying in bed with eyes closed, mouth open and snoring. He appears in no current distress at this time. Q15 minute safety checks are maintained.

## 2016-12-15 NOTE — Progress Notes (Signed)
Patient ID: Francisco Beard, male   DOB: [birth year], 56 y.o.   MRN: [id number]  Per patient, he does not want anyone to be contacted at this time about being taken to the [facility]-ED.

## 2016-12-15 NOTE — Progress Notes (Signed)
Pharmacy Antibiotic Note  Francisco Beard is a 56 y.o. male admitted on 12/15/2016 with pneumonia.  Pharmacy has been consulted for vancomycin/aztreonam dosing.  Plan: Aztreonam 2 gr IV x 1, then 1 gr IV q8h   Vancomycin 2000 mg IV x1, then vancomycin 1000 mg IV q12h Monitor clinical course, renal function, cultures as available     Temp (24hrs), Avg:99.4 F (37.4 C), Min:98.9 F (37.2 C), Max:99.7 F (37.6 C)   Recent Labs Lab 12/09/16 1736 12/14/16 0140 12/15/16 1716  WBC 5.5 6.1 7.1  CREATININE 0.98 0.84 0.97    Estimated Creatinine Clearance: 88.4 mL/min (by C-G formula based on SCr of 0.97 mg/dL).    Allergies  Allergen Reactions  . Aleve [Naproxen] Other (See Comments)    Heart problems so MD told him to not take  . Aspirin Swelling  . Ibuprofen Swelling  . Penicillins Swelling    Has patient had a PCN reaction causing immediate rash, facial/tongue/throat swelling, SOB or lightheadedness with hypotension: Yes Has patient had a PCN reaction causing severe rash involving mucus membranes or skin necrosis: Yes Has patient had a PCN reaction that required hospitalization: Yes Has patient had a PCN reaction occurring within the last 10 years: No If all of the above answers are "NO", then may proceed with Cephalosporin use.   . Seroquel [Quetiapine] Other (See Comments)    In higher doses, this causes excessive sedation  . Strawberry Extract Anaphylaxis  . Tomato Anaphylaxis  . Onion Nausea And Vomiting and Rash    Antimicrobials this admission:  7/15 aztreonam >>  7/15 vancomycin >>   Dose adjustments this admission: ---  Microbiology results:   Thank you for allowing pharmacy to be a part of this patient's care.  [name], PharmD, BCPS Pager [phone] 12/15/2016 7:30 PM

## 2016-12-15 NOTE — Progress Notes (Signed)
[facility] MD Progress Note  12/15/2016 1:59 PM Francisco Beard  MRN:  [id number] Subjective:  Patient reports " I am not doing so well"   Objective: Francisco Beard is awake and alert. Seen resting in bedroom. Reports staffing note patient was provided with Jello ans Zofran, RN reports  and was unable to hold food or medication down. Patient is reports abdominal pain 10/10 and reports he feels worst than yesterday.  Patient denies suicidal or homicidal ideations during this assessment. Denies auditory or visual hallucination and does not appear to be responding to internal stimuli. Patient was recently evaluated at he emergency dept. However continues to report nausea and vomiting and intermittent diarrhea. Support, encouragement and reassurance was provided.   Principal Problem: Schizoaffective disorder (HCC) Diagnosis:   Patient Active Problem List   Diagnosis Date Noted  . Schizoaffective disorder (HCC) [F25.9] 12/10/2016  . Acute respiratory failure (HCC) [J96.00] 11/04/2016  . Allergic rhinitis [J30.9] 11/04/2016  . Tobacco use disorder [F17.200] 11/04/2016  . BPH (benign prostatic hyperplasia) [N40.0] 11/04/2016  . Acute exacerbation of COPD with asthma (HCC) [J44.1, J45.901] 07/09/2016  . Polysubstance abuse [F19.10] 07/09/2016  . Homicidal ideation [R45.850]   . HTN (hypertension) [I10] 04/17/2016  . Chest pain [R07.9]   . COPD exacerbation (HCC) [J44.1] 04/13/2016  . COPD (chronic obstructive pulmonary disease) (HCC) [J44.9] 04/13/2016  . Avascular necrosis of bone of right hip (HCC) [M87.051] 04/08/2016  . COPD with acute exacerbation (HCC) [J44.1] 04/01/2016  . Suicidal ideations [R45.851] 08/17/2013   Total Time spent with patient: 30 minutes  Past Psychiatric History:   Past Medical History:  Past Medical History:  Diagnosis Date  . Anxiety   . Arthritis    "hips; bottom part of my back" (07/09/2016)  . Asthmatic bronchitis   . Bipolar disorder (HCC)   . CHF (congestive heart  failure) (HCC)   . Chronic bronchitis (HCC)   . Chronic hip pain   . COPD (chronic obstructive pulmonary disease) (HCC)   . Depression   . GERD (gastroesophageal reflux disease)   . High cholesterol   . History of hiatal hernia   . Hypertension   . Irregular heart beats   . Pneumonia    "3 times; it's been awhile" (07/09/2016)  . Schizophrenia (HCC)   . Type II diabetes mellitus (HCC)     Past Surgical History:  Procedure Laterality Date  . CLOSED REDUCTION HAND FRACTURE Right    "got 6 screws and a plate in there"  . ELBOW FRACTURE SURGERY Left    "put it through the wall"  . FRACTURE SURGERY     Family History:  Family History  Problem Relation Age of Onset  . Asthma Mother   . Heart murmur Sister   . Heart murmur Brother    Family Psychiatric  History:  Social History:  History  Alcohol Use  . 12.0 oz/week  . 20 Standard drinks or equivalent per week    Comment: 07/09/2016 "I had quit for 1 yr, relapsed 3 days ago"     History  Drug Use  . Types: "Crack" cocaine, Cocaine    Comment: 07/09/2016 "I had quit for 1 yr, relapsed 3 days ago"    Social History   Social History  . Marital status: Divorced    Spouse name: N/A  . Number of children: N/A  . Years of education: N/A   Social History Main Topics  . Smoking status: Heavy Tobacco Smoker    Packs/day: 0.50  Years: 41.00    Types: Cigarettes    Last attempt to quit: 04/08/2016  . Smokeless tobacco: Never Used  . Alcohol use 12.0 oz/week    20 Standard drinks or equivalent per week     Comment: 07/09/2016 "I had quit for 1 yr, relapsed 3 days ago"  . Drug use: Yes    Types: "Crack" cocaine, Cocaine     Comment: 07/09/2016 "I had quit for 1 yr, relapsed 3 days ago"  . Sexual activity: Not Currently   Other Topics Concern  . Not on file   Social History Narrative  . No narrative on file   Additional Social History:                         Sleep: Fair  Appetite:  Fair  Current  Medications: Current Facility-Administered Medications  Medication Dose Route Frequency Provider Last Rate Last Dose  . acetaminophen (TYLENOL) tablet 650 mg  650 mg Oral Q6H PRN Nira ConnBerry, Jason A, NP   650 mg at 12/15/16 0004  . albuterol (PROVENTIL HFA;VENTOLIN HFA) 108 (90 Base) MCG/ACT inhaler 1-2 puff  1-2 puff Inhalation Q6H PRN Nira ConnBerry, Jason A, NP      . alum & mag hydroxide-simeth (MAALOX/MYLANTA) 200-200-20 MG/5ML suspension 30 mL  30 mL Oral Q4H PRN Nira ConnBerry, Jason A, NP      . divalproex (DEPAKOTE) DR tablet 500 mg  500 mg Oral BID Nira ConnBerry, Jason A, NP   500 mg at 12/14/16 2208  . doxycycline (VIBRA-TABS) tablet 100 mg  100 mg Oral Q12H Nira ConnBerry, Jason A, NP   100 mg at 12/14/16 2208  . DULoxetine (CYMBALTA) DR capsule 30 mg  30 mg Oral Daily Nira ConnBerry, Jason A, NP      . hydrOXYzine (VISTARIL) injection 50 mg  50 mg Intramuscular Once PRN Nira ConnBerry, Jason A, NP      . magnesium hydroxide (MILK OF MAGNESIA) suspension 30 mL  30 mL Oral Daily PRN Nira ConnBerry, Jason A, NP      . mometasone-formoterol (DULERA) 100-5 MCG/ACT inhaler 2 puff  2 puff Inhalation BID Nira ConnBerry, Jason A, NP   2 puff at 12/14/16 2208  . ondansetron (ZOFRAN-ODT) disintegrating tablet 8 mg  8 mg Oral Q8H PRN Nira ConnBerry, Jason A, NP   8 mg at 12/15/16 1114  . tamsulosin (FLOMAX) capsule 0.4 mg  0.4 mg Oral Daily Nira ConnBerry, Jason A, NP      . tiotropium (SPIRIVA) inhalation capsule 18 mcg  18 mcg Inhalation Daily Nira ConnBerry, Jason A, NP      . traZODone (DESYREL) tablet 50 mg  50 mg Oral QHS PRN Nira ConnBerry, Jason A, NP   50 mg at 12/15/16 0003    Lab Results: No results found for this or any previous visit (from the past 48 hour(s)).  Blood Alcohol level:  Lab Results  Component Value Date   ETH <5 12/09/2016   ETH <5 08/30/2016    Metabolic Disorder Labs: Lab Results  Component Value Date   HGBA1C 6.0 (H) 12/12/2016   MPG 126 12/12/2016   MPG 137 11/04/2016   Lab Results  Component Value Date   PROLACTIN 53.1 (H) 07/18/2016   Lab Results   Component Value Date   CHOL 220 (H) 12/12/2016   TRIG 76 12/12/2016   HDL 42 12/12/2016   CHOLHDL 5.2 12/12/2016   VLDL 15 12/12/2016   LDLCALC 163 (H) 12/12/2016   LDLCALC 111 (H) 07/18/2016    Physical Findings: AIMS:  , ,  ,  ,  CIWA:    COWS:     Musculoskeletal: Strength & Muscle Tone: within normal limits Gait & Station: normal Patient leans: N/A  Psychiatric Specialty Exam: Physical Exam  Nursing note and vitals reviewed. Cardiovascular: Normal rate.   Neurological: He is alert.  Psychiatric: He has a normal mood and affect. His behavior is normal.    Review of Systems  Respiratory: Negative for shortness of breath.   Gastrointestinal: Positive for abdominal pain, blood in stool, diarrhea, nausea and vomiting.  Psychiatric/Behavioral: Positive for depression. Negative for suicidal ideas. The patient is nervous/anxious.     Blood pressure 100/60, pulse 62, temperature 98.9 F (37.2 C), temperature source Oral, resp. rate 16, SpO2 99 %.There is no height or weight on file to calculate BMI.  General Appearance: Disheveled  Eye Contact:  Minimal  Speech:  Clear and Coherent  Volume:  Normal  Mood:  Depressed and Dysphoric  Affect:  Depressed and Flat  Thought Process:  Coherent  Orientation:  Full (Time, Place, and Person)  Thought Content:  Hallucinations: None  Suicidal Thoughts:  No  Homicidal Thoughts:  No  Memory:  Immediate;   Fair Recent;   Fair Remote;   Fair  Judgement:  Fair  Insight:  Fair  Psychomotor Activity:  Decreased  Concentration:  Concentration: Fair  Recall:  [employer] of Knowledge:  Fair  Language:  Fair  Akathisia:  No  Handed:  Right  AIMS (if indicated):     Assets:  Communication Skills Resilience Social Support  ADL's:  Intact  Cognition:  WNL  Sleep:  Number of Hours: 4.75     I agree with current treatment plan on 12/15/2016, Patient seen face-to-face for psychiatric evaluation follow-up, chart reviewed. Reviewed  the information documented and agree with the treatment plan.  Treatment Plan Summary: Daily contact with patient to assess and evaluate symptoms and progress in treatment and Medication management   Continue with Depakote 500 mg, Cymbalta 30 mg  for mood stabilization. Continue with Trazodone 50 mg for insomnia  -Internal med consulted: MD Choue patient to be transferred for further evaluation:-Consider UA culture and Antiemetics Will continue to monitor vitals ,medication compliance and treatment side effects while patient is here.  Reviewed labs:BAL - 0, UDS - pos for cocaine CSW will start working on disposition.  Patient to participate in therapeutic milieu  Oneta Rack, NP 12/15/2016, 1:59 PM

## 2016-12-15 NOTE — ED Notes (Signed)
IV US ATTEMPTED TWICE HOWEVER NOT SUCCESSFUL. REQUESTED JAKE RN TO ASSISTED WITH IV ACCESS

## 2016-12-15 NOTE — Progress Notes (Signed)
Patient ID: Francisco Beard, male   DOB: February 08, 1961, 56 y.o.   MRN: [id number]  EMS came and is to transport patient to [facility] room 12 per [facility] Charge RN. Patient reports mid, upper gastric pain. Per care/instruction order patient is not sent with [facility] escort as patient denies SI.

## 2016-12-15 NOTE — H&P (Signed)
History and Physical    Francisco Beard ZOX:[id number] DOB: 06-Oct-1960 DOA: 12/15/2016  PCP: Patient, No Pcp Per   Patient coming from: [facility].  I have personally briefly reviewed patient's old medical records in [facility]  Chief Complaint: Abdominal pain, nausea and emesis for the past 2 days.  HPI: Francisco Beard is a 56 y.o. male with medical history significant of anxiety, depression, bipolar disorder, schizoaffective disorder, asthmatic bronchitis, history of CHF (had normal echo in February of this year), COPD, chronic hip pain, GERD, hiatal hernia, hyperlipidemia, history of pneumonia 3, type 2 diabetes who was transferred from the behavioral [facility] for the second day in a row for evaluation of upper abdominal pain, associated with nausea and vomiting. The patient also stated that he has been constipated and his last bowel movement was 3 days ago. He denies fever, chills, headache, chest pain, palpitations, dizziness, diaphoresis, pitting edema of the lower extremities, melena, hematochezia, dysuria, frequency or hematuria. He is currently awake, alert, oriented 4, but mildly sedated after he was given Benadryl 25 mg IVP earlier  ED Course: Initial vital signs emergency department temperature 99.69F, pulse 59, respirations 18, blood pressure 136/83 mmHg and O2 sat 98% on room air. His urinalysis was negative. WBC 7.1, hemoglobin 12.2 g/dL and platelets 811. CMP showed a calcium of 8.8 mg/dL, but was otherwise negative. Lipase was 14.   Imaging: Chest radiograph today showed right basilar atelectasis. This scan of the abdomen done yesterday shows sigmoid diverticulosis, diffuse thickening and trabecular appearance of the bladder wall, punctate nonobstructive left renal upper pole stone without hydronephrosis right lung base atelectasis versus pneumonia and severe posterior arthritic changes of the right hip, findings suggestive of possible vascular necrosis of the left femoral head.  Please see images and full radiology report for further detail.  Review of Systems: As per HPI otherwise 10 point review of systems negative.    Past Medical History:  Diagnosis Date  . Anxiety   . Arthritis    "hips; bottom part of my back" (07/09/2016)  . Asthmatic bronchitis   . Bipolar disorder (HCC)   . CHF (congestive heart failure) (HCC)   . Chronic bronchitis (HCC)   . Chronic hip pain   . COPD (chronic obstructive pulmonary disease) (HCC)   . Depression   . GERD (gastroesophageal reflux disease)   . High cholesterol   . History of hiatal hernia   . Hypertension   . Irregular heart beats   . Pneumonia    "3 times; it's been awhile" (07/09/2016)  . Schizophrenia (HCC)   . Type II diabetes mellitus (HCC)     Past Surgical History:  Procedure Laterality Date  . CLOSED REDUCTION HAND FRACTURE Right    "got 6 screws and a plate in there"  . ELBOW FRACTURE SURGERY Left    "put it through the wall"  . FRACTURE SURGERY       reports that he has been smoking Cigarettes.  He has a 20.50 pack-year smoking history. He has never used smokeless tobacco. He reports that he drinks about 12.0 oz of alcohol per week . He reports that he uses drugs, including "Crack" cocaine and Cocaine.  Allergies  Allergen Reactions  . Aleve [Naproxen] Other (See Comments)    Heart problems so MD told him to not take  . Aspirin Swelling  . Ibuprofen Swelling  . Penicillins Swelling    Has patient had a PCN reaction causing immediate rash, facial/tongue/throat swelling, SOB or lightheadedness with  hypotension: Yes Has patient had a PCN reaction causing severe rash involving mucus membranes or skin necrosis: Yes Has patient had a PCN reaction that required hospitalization: Yes Has patient had a PCN reaction occurring within the last 10 years: No If all of the above answers are "NO", then may proceed with Cephalosporin use.   . Seroquel [Quetiapine] Other (See Comments)    In higher doses, this  causes excessive sedation  . Strawberry Extract Anaphylaxis  . Tomato Anaphylaxis  . Onion Nausea And Vomiting and Rash    Family History  Problem Relation Age of Onset  . Asthma Mother   . Heart murmur Sister   . Heart murmur Brother     Prior to Admission medications   Medication Sig Start Date End Date Taking? Authorizing Provider  albuterol (PROVENTIL HFA;VENTOLIN HFA) 108 (90 Base) MCG/ACT inhaler Inhale 1-2 puffs into the lungs every 6 (six) hours as needed for wheezing or shortness of breath. 12/14/16  Yes Donnetta Hutching, MD  amLODipine (NORVASC) 5 MG tablet Take 1 tablet (5 mg total) by mouth daily. 11/05/16  Yes Nyra Market, MD  Ascorbic Acid (VITAMIN C PO) Take 1 tablet by mouth daily.   Yes [provider]  atorvastatin (LIPITOR) 20 MG tablet Take 20 mg by mouth daily.   Yes [provider]  budesonide-formoterol (SYMBICORT) 80-4.5 MCG/ACT inhaler Inhale 2 puffs into the lungs 2 (two) times daily. 11/05/16  Yes Nyra Market, MD  cholecalciferol (VITAMIN D) 1000 units tablet Take 1,000 Units by mouth 2 (two) times daily.   Yes [provider]  DULoxetine (CYMBALTA) 30 MG capsule Take 30 mg by mouth daily.   Yes [provider]  ferrous gluconate (FERGON) 324 MG tablet Take 1 tablet (324 mg total) by mouth daily with breakfast. 11/05/16  Yes Nyra Market, MD  finasteride (PROSCAR) 5 MG tablet Take 1 tablet (5 mg total) by mouth daily. 11/06/16  Yes Nyra Market, MD  fluPHENAZine (PROLIXIN) 10 MG tablet Take 10-20 mg by mouth See admin instructions. Pt takes 10mg  in am, 20mg  in pm   Yes [provider]  fluticasone (FLONASE) 50 MCG/ACT nasal spray Place 1 spray into both nostrils daily. 11/06/16  Yes Nyra Market, MD  folic acid (FOLVITE) 1 MG tablet Take 1 mg by mouth daily.   Yes [provider]  pantoprazole (PROTONIX) 40 MG tablet Take 1 tablet (40 mg total) by mouth 2 (two) times daily. 11/05/16  Yes Nyra Market, MD    tamsulosin (FLOMAX) 0.4 MG CAPS capsule Take 1 capsule (0.4 mg total) by mouth daily. 11/06/16  Yes Nyra Market, MD  theophylline (THEO-24) 300 MG 24 hr capsule Take 300 mg by mouth daily.   Yes [provider]  thiamine 100 MG tablet Take 1 tablet (100 mg total) by mouth daily. 07/24/16  Yes Rodolph Bong, MD  tiotropium (SPIRIVA HANDIHALER) 18 MCG inhalation capsule Place 1 capsule (18 mcg total) into inhaler and inhale daily. 11/05/16  Yes Nyra Market, MD  divalproex (DEPAKOTE) 500 MG DR tablet Take 1 tablet (500 mg total) by mouth 2 (two) times daily. Patient not taking: Reported on 12/09/2016 11/05/16 12/05/16  Nyra Market, MD  divalproex (DEPAKOTE) 500 MG DR tablet Take 500 mg by mouth 2 (two) times daily.  10/14/16   [provider]  famotidine (PEPCID AC) 10 MG chewable tablet Chew 10 mg by mouth 2 (two) times daily as needed for heartburn.    [provider]  HYDROcodone-acetaminophen (NORCO/VICODIN)  5-325 MG tablet Take 1 tablet by mouth every 6 (six) hours as needed for moderate pain. Patient not taking: Reported on 12/09/2016 04/17/16   Rodolph Bong, MD  ondansetron (ZOFRAN ODT) 8 MG disintegrating tablet Take 1 tablet (8 mg total) by mouth every 8 (eight) hours as needed for nausea or vomiting. 12/14/16   Donnetta Hutching, MD    Physical Exam: Vitals:   12/15/16 1606 12/15/16 1814 12/15/16 1822  BP: 136/83  125/71  Pulse: (!) 59  69  Resp: 18  18  Temp: 99.7 F (37.6 C)    TempSrc: Oral    SpO2: 98% 94% 100%    Constitutional: NAD, calm, comfortable Eyes: PERRL, lids and conjunctivae normal ENMT: Mucous membranes are mildly dry. Posterior pharynx clear of any exudate or lesions.  Neck: normal, supple, no masses, no thyromegaly Respiratory: Mild rhonchi and wheezing bilaterally. Normal respiratory effort. No accessory muscle use.  Cardiovascular: Regular rate and rhythm, no murmurs / rubs / gallops. No extremity edema. 2+ pedal pulses. No  carotid bruits.  Abdomen: Soft, no tenderness, no masses palpated. No hepatosplenomegaly. Bowel sounds positive.  Musculoskeletal: no clubbing / cyanosis. Decreased hips ROM, no contractures. Normal muscle tone.  Skin: no rashes, lesions, ulcers on limited skin exam. Neurologic: CN 2-12 grossly intact. Sensation intact, DTR normal. Strength 5/5 in all 4.  Psychiatric: Mildly sedated from Benadryl. Alert and oriented x 4.   Labs on Admission: I have personally reviewed following labs and imaging studies  CBC:  Recent Labs Lab 12/09/16 1736 12/14/16 0140 12/15/16 1716  WBC 5.5 6.1 7.1  NEUTROABS  --  3.5 5.0  HGB 12.6* 13.2 12.7*  HCT 38.6* 39.0 37.8*  MCV 87.7 86.9 85.5  PLT 250 249 242   Basic Metabolic Panel:  Recent Labs Lab 12/09/16 1736 12/14/16 0140 12/15/16 1716  NA 143 140 140  K 3.6 3.5 3.9  CL 113* 109 111  CO2 23 22 23   GLUCOSE 119* 97 90  BUN 15 10 10   CREATININE 0.98 0.84 0.97  CALCIUM 9.0 8.8* 8.8*   GFR: Estimated Creatinine Clearance: 88.4 mL/min (by C-G formula based on SCr of 0.97 mg/dL). Liver Function Tests:  Recent Labs Lab 12/09/16 1736 12/15/16 1716  AST 36 26  ALT 26 23  ALKPHOS 56 52  BILITOT 0.4 0.5  PROT 6.6 6.6  ALBUMIN 3.7 3.7    Recent Labs Lab 12/15/16 1716  LIPASE 14   No results for input(s): AMMONIA in the last 168 hours. Coagulation Profile: No results for input(s): INR, PROTIME in the last 168 hours. Cardiac Enzymes: No results for input(s): CKTOTAL, CKMB, CKMBINDEX, TROPONINI in the last 168 hours. BNP (last 3 results) No results for input(s): PROBNP in the last 8760 hours. HbA1C: No results for input(s): HGBA1C in the last 72 hours. CBG: No results for input(s): GLUCAP in the last 168 hours. Lipid Profile: No results for input(s): CHOL, HDL, LDLCALC, TRIG, CHOLHDL, LDLDIRECT in the last 72 hours. Thyroid Function Tests: No results for input(s): TSH, T4TOTAL, FREET4, T3FREE, THYROIDAB in the last 72  hours. Anemia Panel: No results for input(s): VITAMINB12, FOLATE, FERRITIN, TIBC, IRON, RETICCTPCT in the last 72 hours. Urine analysis:    Component Value Date/Time   COLORURINE STRAW (A) 12/15/2016 1602   APPEARANCEUR CLEAR 12/15/2016 1602   LABSPEC 1.013 12/15/2016 1602   PHURINE 7.0 12/15/2016 1602   GLUCOSEU NEGATIVE 12/15/2016 1602   HGBUR NEGATIVE 12/15/2016 1602   BILIRUBINUR NEGATIVE 12/15/2016 1602   KETONESUR  NEGATIVE 12/15/2016 1602   PROTEINUR NEGATIVE 12/15/2016 1602   NITRITE NEGATIVE 12/15/2016 1602   LEUKOCYTESUR NEGATIVE 12/15/2016 1602    Radiological Exams on Admission: Dg Chest 2 View  Result Date: 12/14/2016 CLINICAL DATA:  Shortness of breath EXAM: CHEST  2 VIEW COMPARISON:  11/04/2016 FINDINGS: Coarse perihilar interstitial opacity slight increased from prior. No focal consolidation or effusion. Normal heart size. No pneumothorax. IMPRESSION: Coarse perihilar interstitial opacity could relate to bronchial inflammation. No focal pneumonia. Electronically Signed   By: Jasmine PangKim  Fujinaga M.D.   On: 12/14/2016 00:52   Ct Abdomen Pelvis W Contrast  Result Date: 12/14/2016 CLINICAL DATA:  56 year old male with vomiting and diarrhea. Patient is on antibiotic. EXAM: CT ABDOMEN AND PELVIS WITH CONTRAST TECHNIQUE: Multidetector CT imaging of the abdomen and pelvis was performed using the standard protocol following bolus administration of intravenous contrast. CONTRAST:  100mL ISOVUE-300 IOPAMIDOL (ISOVUE-300) INJECTION 61% COMPARISON:  CT dated 08/11/2016 FINDINGS: Lower chest: Right lung base subsegmental atelectasis/ scarring. Infiltrate is less likely but not excluded. Clinical correlation is recommended. No intra-abdominal free air or free fluid. Hepatobiliary: No focal liver abnormality is seen. No gallstones, gallbladder wall thickening, or biliary dilatation. Pancreas: Unremarkable. No pancreatic ductal dilatation or surrounding inflammatory changes. Spleen: Normal in size  without focal abnormality. Adrenals/Urinary Tract: The adrenal glands are unremarkable. There is a punctate nonobstructing left renal upper pole stone. No hydronephrosis. The right kidney is unremarkable. There is diffuse thickened appearance the bladder wall. Correlation with urinalysis recommended to exclude cystitis. Stomach/Bowel: Small hiatal hernia. There is sigmoid diverticulosis with muscular hypertrophy. No active inflammatory changes. There is no evidence of bowel obstruction or active inflammation. Normal appendix. Vascular/Lymphatic: The abdominal aorta and IVC appear unremarkable. The origins of the celiac axis, SMA, IMA and the origins of the renal arteries are patent. The SMV, splenic vein, and main portal vein are patent. No portal venous gas identified. There is no adenopathy. Reproductive: The prostate and seminal vesicles are grossly unremarkable. Other: None Musculoskeletal: Mild degenerate changes of the spine. Disc desiccation with vacuum phenomena at L4-L5. Severe right hip osteoarthritis with loss of superior joint space and bone-on-bone contact. There is large sub cortical cysts with mild flattening of the superior femoral head. Linear sclerotic density in the left femoral head may represent chronic changes or related to avascular necrosis. No acute fracture. IMPRESSION: 1. Sigmoid diverticulosis. No evidence of bowel obstruction or active inflammation. Normal appendix. 2. Diffuse thickening and trabecular appearance of the bladder wall may be related to chronic bladder outlet obstruction. Correlation with urinalysis recommended to exclude UTI. 3. Punctate nonobstructing left renal upper pole stone. No hydronephrosis. 4. Right lung base atelectasis/scarring versus less likely pneumonia. 5. 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 No acute fracture. Electronically Signed   By: Elgie CollardArash  Radparvar M.D.   On:  12/14/2016 04:28   Dg Abd Acute W/chest  Result Date: 12/15/2016 CLINICAL DATA:  Vomiting yesterday and today, former smoker, asthma, COPD, CHF, diabetes mellitus, hypertension EXAM: DG ABDOMEN ACUTE W/ 1V CHEST COMPARISON:  CT abdomen and pelvis 12/14/2016, chest radiograph 12/14/2016 FINDINGS: Normal heart size, mediastinal contours, and pulmonary vascularity. RIGHT basilar atelectasis. Emphysematous changes with bullous disease at RIGHT apex. No infiltrate, pleural effusion or pneumothorax. Nonobstructive bowel gas pattern. No bowel dilatation, bowel wall thickening, or free air. Bones demineralized. Advanced degenerative changes of the RIGHT hip joint with marked joint space narrowing, sclerosis and significant lucency/subchondral cyst  formation at the RIGHT femoral head along with partial femoral head partial collapse most consistent with sequela of avascular necrosis. IMPRESSION: RIGHT basilar atelectasis. Normal bowel gas pattern. 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 Electronically Signed   By: Ulyses Southward M.D.   On: 12/15/2016 16:30   07/16/2016 echocardiogram complete ------------------------------------------------------------------- LV EF: 65% -   70%  ------------------------------------------------------------------- Indications:      Chest pain 786.51.  ------------------------------------------------------------------- History:   PMH:  Polysubstance abuse  Chronic obstructive pulmonary disease.  Risk factors:  Hypertension.  ------------------------------------------------------------------- Study Conclusions  - Left ventricle: The cavity size was normal. There was mild   concentric hypertrophy. Systolic function was vigorous. The   estimated ejection fraction was in the range of 65% to 70%. Wall   motion was normal; there were no regional wall motion   abnormalities. Left ventricular diastolic  function parameters   were normal. - Aortic valve: Poorly visualized. Trileaflet; mildly thickened   leaflets. Valve area (VTI): 1.65 cm^2. Valve area (Vmax): 1.85   cm^2. Valve area (Vmean): 2.16 cm^2. - Pulmonary arteries: Systolic pressure could not be accurately   estimated.  EKG: Independently reviewed. Sinus bradycardia at 56 bpm. Baseline wander in II, III and AVF.  Assessment/Plan Principal Problem:   HCAP (healthcare-associated pneumonia) Has the patient aspirated? Admit to telemetry/inpatient. Continue supplemental oxygen. Continue bronchodilators as needed. Continue aztreonam and vancomycin per pharmacy. Check sputum Gram stain, culture and sensitivity. Check Legionella urinary antigen.  Active Problems:   COPD (chronic obstructive pulmonary disease) (HCC) Continue supplemental oxygen. Continue albuterol nebulizers as needed.    Schizoaffective disorder (HCC) Continue Depakote 500 mg by mouth twice a day. Continue Cymbalta at 30 mg by mouth daily. Continue trazodone 50 mg by mouth at bedtime. Lorazepam 1 mg IVPB every 4 hours as needed for restlessness/anxiety    Homicidal ideation Denies homicidal or suicidal ideations at this time. One to one sitter until cleared by psychiatry.      HTN (hypertension) Continue amlodipine 5 mg by mouth daily. Monitor blood pressure.      Tobacco use disorder Declined nicotine replacement therapy. Staff to provide smoking cessation information.    BPH (benign prostatic hyperplasia) Continue Flomax.    Hyperlipidemia Continue atorvastatin 20 mg by mouth every evening. Monitor hepatic function panel. Fasting lipid profile follow-up as an outpatient.     DVT prophylaxis: SCDs. Code Status: Full code. Family Communication:  Disposition Plan: Admit for IV antibiotics and symptoms treatment. Consults called:  Admission status: Inpatient/telemetry.   Bobette Mo MD [facility] Pager  [phone].  If 7PM-7AM, please contact night-coverage [email or web address] Password [email or web address]  12/15/2016, 8:20 PM

## 2016-12-15 NOTE — Progress Notes (Signed)
Patient ID: Francisco AsalJohn Beard, male   DOB: [birth year], 56 y.o.   MRN: [id number]  Patient has been laying in bed throughout the morning into the afternoon. He has been laying in bed with his eyes closed. Respirations are even and unlabored when observed. As patient began to awake, writer inquired patient's current status with his nausea. He reports nausea and not being able to drink or eat anything. Patient was given PRN Zofran dissolvable tablets (8 mg). Patient currently denies SI/HI and A/V hallucinations. He reports pain in his abdomen 10/10. He reports it is aching. He refuses all other medications at this time. Vital signs were obtained, see flowsheet. Q15 minute safety checks are maintained.

## 2016-12-16 DIAGNOSIS — J189 Pneumonia, unspecified organism: Principal | ICD-10-CM

## 2016-12-16 LAB — RAPID URINE DRUG SCREEN, HOSP PERFORMED
AMPHETAMINES: NOT DETECTED
BARBITURATES: NOT DETECTED
Benzodiazepines: NOT DETECTED
COCAINE: NOT DETECTED
OPIATES: POSITIVE — AB
TETRAHYDROCANNABINOL: NOT DETECTED

## 2016-12-16 LAB — CBC
HCT: 36.4 % — ABNORMAL LOW (ref 39.0–52.0)
HEMOGLOBIN: 12.2 g/dL — AB (ref 13.0–17.0)
MCH: 28 pg (ref 26.0–34.0)
MCHC: 33.5 g/dL (ref 30.0–36.0)
MCV: 83.7 fL (ref 78.0–100.0)
PLATELETS: 230 10*3/uL (ref 150–400)
RBC: 4.35 MIL/uL (ref 4.22–5.81)
RDW: 15.3 % (ref 11.5–15.5)
WBC: 5 10*3/uL (ref 4.0–10.5)

## 2016-12-16 LAB — BASIC METABOLIC PANEL
ANION GAP: 7 (ref 5–15)
BUN: 9 mg/dL (ref 6–20)
CHLORIDE: 111 mmol/L (ref 101–111)
CO2: 20 mmol/L — ABNORMAL LOW (ref 22–32)
Calcium: 8.5 mg/dL — ABNORMAL LOW (ref 8.9–10.3)
Creatinine, Ser: 0.89 mg/dL (ref 0.61–1.24)
GFR calc Af Amer: >60 mL/min (ref >60)
Glucose, Bld: 84 mg/dL (ref 65–99)
POTASSIUM: 3.8 mmol/L (ref 3.5–5.1)
SODIUM: 138 mmol/L (ref 135–145)

## 2016-12-16 LAB — STREP PNEUMONIAE URINARY ANTIGEN: Strep Pneumo Urinary Antigen: NEGATIVE

## 2016-12-16 LAB — MRSA PCR SCREENING: MRSA by PCR: NEGATIVE

## 2016-12-16 MED ORDER — HYDROCODONE-ACETAMINOPHEN 5-325 MG PO TABS
1.0000 | ORAL_TABLET | Freq: Four times a day (QID) | ORAL | Status: DC | PRN
  Administered 2016-12-16 – 2016-12-17 (×3): 1 via ORAL
  Filled 2016-12-16 (×3): qty 1

## 2016-12-16 MED ORDER — GUAIFENESIN ER 600 MG PO TB12
600.0000 mg | ORAL_TABLET | Freq: Two times a day (BID) | ORAL | Status: DC
  Administered 2016-12-16 – 2016-12-17 (×3): 600 mg via ORAL
  Filled 2016-12-16 (×3): qty 1

## 2016-12-16 MED ORDER — HYDROCODONE-ACETAMINOPHEN 5-325 MG PO TABS
2.0000 | ORAL_TABLET | Freq: Once | ORAL | Status: AC
  Administered 2016-12-16: 2 via ORAL
  Filled 2016-12-16: qty 2

## 2016-12-16 MED ORDER — POLYETHYLENE GLYCOL 3350 17 G PO PACK: 17.0000 g | PACK | Freq: Every day | ORAL | Status: DC

## 2016-12-16 MED ORDER — PREDNISONE 50 MG PO TABS
60.0000 mg | ORAL_TABLET | Freq: Every day | ORAL | Status: DC
  Administered 2016-12-16 – 2016-12-17 (×2): 60 mg via ORAL
  Filled 2016-12-16 (×2): qty 1

## 2016-12-16 MED ORDER — AZTREONAM IN DEXTROSE 1 GM/50ML IV SOLN
1.0000 g | Freq: Three times a day (TID) | INTRAVENOUS | Status: DC
  Administered 2016-12-16 – 2016-12-17 (×2): 1 g via INTRAVENOUS
  Filled 2016-12-16 (×3): qty 50

## 2016-12-16 MED ORDER — BISACODYL 10 MG RE SUPP: 10.0000 mg | Freq: Every day | RECTAL | Status: DC

## 2016-12-16 MED ORDER — SENNOSIDES-DOCUSATE SODIUM 8.6-50 MG PO TABS
1.0000 | ORAL_TABLET | Freq: Two times a day (BID) | ORAL | Status: DC
  Administered 2016-12-16 (×2): 1 via ORAL
  Filled 2016-12-16 (×2): qty 1

## 2016-12-16 MED ORDER — IPRATROPIUM-ALBUTEROL 0.5-2.5 (3) MG/3ML IN SOLN
3.0000 mL | Freq: Four times a day (QID) | RESPIRATORY_TRACT | Status: DC
  Administered 2016-12-16 – 2016-12-17 (×5): 3 mL via RESPIRATORY_TRACT
  Filled 2016-12-16 (×5): qty 3

## 2016-12-16 MED ORDER — POLYETHYLENE GLYCOL 3350 17 G PO PACK
17.0000 g | PACK | Freq: Two times a day (BID) | ORAL | Status: DC
  Administered 2016-12-16: 17 g via ORAL
  Filled 2016-12-16: qty 1

## 2016-12-16 MED ORDER — TRAZODONE HCL 50 MG PO TABS
50.0000 mg | ORAL_TABLET | Freq: Every day | ORAL | Status: DC
  Administered 2016-12-16: 50 mg via ORAL
  Filled 2016-12-16: qty 1

## 2016-12-16 NOTE — Care Management Note (Signed)
Case Management Note  Patient Details  Name: Leanord AsalJohn Rimel MRN: [id number] Date of Birth: 03-02-1961  Subjective/Objective:            Abd.pain avascular necrosis of the rt femoral head via abd films        Action/Plan:Date:  December 16, 2016  Chart reviewed for concurrent status and case management needs.  Will continue to follow patient progress.  Discharge Planning: following for needs  Expected discharge date: [id number]  Marcelle SmilingRhonda Deiona Hooper, BSN, [place]RN3, [place]CCM   [phone]    Expected Discharge Date:                  Expected Discharge Plan:  Home/Self Care  In-House Referral:     Discharge planning Services  CM Consult  Post Acute Care Choice:    Choice offered to:     DME Arranged:    DME Agency:     HH Arranged:    HH Agency:     Status of Service:  In process, will continue to follow  If discussed at Long Length of Stay Meetings, dates discussed:    Additional Comments:  Golda AcreDavis, Jakaya Jacobowitz Lynn, RN 12/16/2016, 9:15 AM

## 2016-12-16 NOTE — Progress Notes (Signed)
Unable to complete admission due to patient not willing to answer questions. Does answer simple yes and no but not answering asst questions. Will pass to day shift to complete when patient is cooperative. Will c/t monitor.

## 2016-12-16 NOTE — Progress Notes (Signed)
Sitter placed at bedside, room checked and secured. Will c/t monitor. Patient resting quietly.

## 2016-12-16 NOTE — Progress Notes (Signed)
Patient seen on 7/10 for inpatient psychiatric placement. [facility]. Discussed Plan w/ physician-pt. Will return to behavioral health.  CSW will assist with patient appropriate discharge once medically stable.   Vivi BarrackNicole Teyona Nichelson, Theresia MajorsLCSWA, MSW Clinical Social Worker 5E and Psychiatric Service Line [phone] 12/16/2016  10:37 AM

## 2016-12-16 NOTE — Progress Notes (Signed)
Pt has L arm edema from prior IV insertion. Arm elevated on pillows and ice applied. MD was notified of arm swelling.  Bogdan Vivona W Kriston Pasquarello, RN

## 2016-12-16 NOTE — Progress Notes (Signed)
Pt c/o pain to hips from previous hip replacements and abdomen. Nothing ordered for pain. Contacted provider on call. Will c/t monitor.

## 2016-12-16 NOTE — Discharge Summary (Deleted)
  The note originally documented on this encounter has been moved the the encounter in which it belongs.  

## 2016-12-16 NOTE — Progress Notes (Signed)
Nutrition Brief Note  RD consulted via COPD gold protocol. Patient with weight gain. Was eating 100% in ED [facility] 7[facility]10 [facility]   Wt Readings from Last 15 Encounters:  12/15/16 184 lb 11.9 oz (83.8 kg)  12/09/16 180 lb (81.6 kg)  11/04/16 180 lb (81.6 kg)  08/30/16 180 lb (81.6 kg)  07/24/16 175 lb 11.3 oz (79.7 kg)  05/25/16 180 lb (81.6 kg)  04/17/16 153 lb 6.4 oz (69.6 kg)  04/04/16 166 lb 3.6 oz (75.4 kg)  08/14/13 175 lb 11.2 oz (79.7 kg)  04/28/13 174 lb (78.9 kg)    Body mass index is 25.77 kg/m. Patient meets criteria for overweight based on current BMI.   Current diet order is soft. Diet just advanced. Labs and medications reviewed.   No nutrition interventions warranted at this time. If nutrition issues arise, please consult RD.   Tilda FrancoLindsey Kiah Vanalstine, MS, RD, LDN Pager: [phone] After Hours Pager: [phone]

## 2016-12-16 NOTE — Progress Notes (Signed)
PROGRESS NOTE  Francisco AsalJohn Beard ZOX:[id number] DOB: Sep 17, 1960 DOA: 12/15/2016 PCP: Patient, No Pcp Per  HPI/Recap of past 24 hours:  Cough and wheezing, denies n/v, report ab pain has resolved, report has not have bm for several days  Assessment/Plan: Principal Problem:   HCAP (healthcare-associated pneumonia) Active Problems:   COPD (chronic obstructive pulmonary disease) (HCC)   HTN (hypertension)   Homicidal ideation   Tobacco use disorder   BPH (benign prostatic hyperplasia)   Schizoaffective disorder (HCC)   Hyperlipidemia  Copd exacerbation:  on exam diffused wheezing, rhonchi, start nebs, steroid, mucinex, continue abx Home meds throphyline continued  Schizoaffective disorder (HCC) Continue Depakote 500 mg by mouth twice a day. Continue Cymbalta at 30 mg by mouth daily. Continue trazodone 50 mg by mouth at bedtime. Lorazepam 1 mg IVPB every 4 hours as needed for restlessness/anxiety    Homicidal ideation Denies homicidal or suicidal ideations at this time. One to one sitter until cleared by psychiatry.      HTN (hypertension) Continue amlodipine 5 mg by mouth daily. Monitor blood pressure.      Tobacco use disorder Declined nicotine replacement therapy. Staff to provide smoking cessation information.    BPH (benign prostatic hyperplasia) Continue Flomax.    Hyperlipidemia Continue atorvastatin 20 mg by mouth every evening. Monitor hepatic function panel. Fasting lipid profile follow-up as an outpatient.     DVT prophylaxis: SCDs. Code Status: Full code. Family Communication:  Disposition Plan: Admit for IV antibiotics and symptoms treatment. Consults called: psychiatry   Procedures:  none  Antibiotics:  vanc from admission to 7/16  Aztreonam from admission to   Objective: BP 104/63 (BP Location: Right Arm)   Pulse 66   Temp 98 F (36.7 C) (Axillary)   Resp 18   Ht 5\' 11"  (1.803 m)   Wt 83.8 kg (184 lb 11.9 oz)   SpO2 93%    BMI 25.77 kg/m   Intake/Output Summary (Last 24 hours) at 12/16/16 0808 Last data filed at 12/16/16 0800  Gross per 24 hour  Intake          1339.58 ml  Output             1475 ml  Net          -135.42 ml   Filed Weights   12/15/16 2127  Weight: 83.8 kg (184 lb 11.9 oz)    Exam:   General:  NAD  Cardiovascular: RRR  Respiratory: diffuse bilateral wheezing, + rhonchi  Abdomen: Soft/ND/NT, positive BS  Musculoskeletal: No Edema  Neuro: aaox3  Data Reviewed: Basic Metabolic Panel:  Recent Labs Lab 12/09/16 1736 12/14/16 0140 12/15/16 1716 12/16/16 0519  NA 143 140 140 138  K 3.6 3.5 3.9 3.8  CL 113* 109 111 111  CO2 23 22 23  20*  GLUCOSE 119* 97 90 84  BUN 15 10 10 9   CREATININE 0.98 0.84 0.97 0.89  CALCIUM 9.0 8.8* 8.8* 8.5*   Liver Function Tests:  Recent Labs Lab 12/09/16 1736 12/15/16 1716  AST 36 26  ALT 26 23  ALKPHOS 56 52  BILITOT 0.4 0.5  PROT 6.6 6.6  ALBUMIN 3.7 3.7    Recent Labs Lab 12/15/16 1716  LIPASE 14   No results for input(s): AMMONIA in the last 168 hours. CBC:  Recent Labs Lab 12/09/16 1736 12/14/16 0140 12/15/16 1716 12/16/16 0519  WBC 5.5 6.1 7.1 5.0  NEUTROABS  --  3.5 5.0  --   HGB 12.6* 13.2 12.7* 12.2*  HCT 38.6* 39.0 37.8* 36.4*  MCV 87.7 86.9 85.5 83.7  PLT 250 249 242 230   Cardiac Enzymes:   No results for input(s): CKTOTAL, CKMB, CKMBINDEX, TROPONINI in the last 168 hours. BNP (last 3 results)  Recent Labs  04/01/16 1412  BNP 43.2    ProBNP (last 3 results) No results for input(s): PROBNP in the last 8760 hours.  CBG: No results for input(s): GLUCAP in the last 168 hours.  No results found for this or any previous visit (from the past 240 hour(s)).   Studies: Dg Abd Acute W/chest  Result Date: 12/15/2016 CLINICAL DATA:  Vomiting yesterday and today, former smoker, asthma, COPD, CHF, diabetes mellitus, hypertension EXAM: DG ABDOMEN ACUTE W/ 1V CHEST COMPARISON:  CT abdomen and pelvis  12/14/2016, chest radiograph 12/14/2016 FINDINGS: Normal heart size, mediastinal contours, and pulmonary vascularity. RIGHT basilar atelectasis. Emphysematous changes with bullous disease at RIGHT apex. No infiltrate, pleural effusion or pneumothorax. Nonobstructive bowel gas pattern. No bowel dilatation, bowel wall thickening, or free air. Bones demineralized. Advanced degenerative changes of the RIGHT hip joint with marked joint space narrowing, sclerosis and significant lucency/subchondral cyst formation at the RIGHT femoral head along with partial femoral head partial collapse most consistent with sequela of avascular necrosis. IMPRESSION: RIGHT basilar atelectasis. Normal bowel gas pattern. 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 Electronically Signed   By: Ulyses Southward M.D.   On: 12/15/2016 16:30    Scheduled Meds: . amLODipine  5 mg Oral Daily  . atorvastatin  20 mg Oral q1800  . cholecalciferol  1,000 Units Oral BID  . divalproex  500 mg Oral BID  . DULoxetine  30 mg Oral Daily  . finasteride  5 mg Oral Daily  . mometasone-formoterol  2 puff Inhalation BID  . pantoprazole (PROTONIX) IV  40 mg Intravenous Q12H  . tamsulosin  0.4 mg Oral Daily  . theophylline  150 mg Oral Q12H  . thiamine  100 mg Oral Daily  . traZODone  50 mg Oral QHS    Continuous Infusions: . sodium chloride 125 mL/hr at 12/15/16 2020  . aztreonam Stopped (12/16/16 0530)  . vancomycin       Time spent:  Goro Wenrick MD, PhD  [facility] Pager [phone]. If 7PM-7AM, please contact night-coverage at [email or web address], password [facility] 12/16/2016, 8:08 AM  LOS: 1 day

## 2016-12-16 NOTE — Discharge Summary (Signed)
Physician Discharge Summary Note  Patient:  Francisco Beard is an 56 y.o., male MRN:  [id number] DOB:  06/27/1960 Patient phone:  [phone] (home)  Patient address:   [address] [place] [address],  Total Time spent with patient: 20 minutes  Date of Admission:  12/15/2016 Date of Discharge: 12/16/2016  Reason for Admission: Per behavioral health medical screeeing exam- Francisco Beard is an 56 y.o. male who arrived voluntarily to [facility] accompanied by his case manager from ACT Team with c/o depression, homicidal ideation and passive suicidal ideations.  Principal Problem: HCAP (healthcare-associated pneumonia) Discharge Diagnoses: Patient Active Problem List   Diagnosis Date Noted  . HCAP (healthcare-associated pneumonia) [J18.9] 12/15/2016  . Hyperlipidemia [E78.5] 12/15/2016  . Schizoaffective disorder (HCC) [F25.9] 12/10/2016  . Acute respiratory failure (HCC) [J96.00] 11/04/2016  . Allergic rhinitis [J30.9] 11/04/2016  . Tobacco use disorder [F17.200] 11/04/2016  . BPH (benign prostatic hyperplasia) [N40.0] 11/04/2016  . Acute exacerbation of COPD with asthma (HCC) [J44.1, J45.901] 07/09/2016  . Polysubstance abuse [F19.10] 07/09/2016  . Homicidal ideation [R45.850]   . HTN (hypertension) [I10] 04/17/2016  . Chest pain [R07.9]   . COPD exacerbation (HCC) [J44.1] 04/13/2016  . COPD (chronic obstructive pulmonary disease) (HCC) [J44.9] 04/13/2016  . Avascular necrosis of bone of right hip (HCC) [M87.051] 04/08/2016  . COPD with acute exacerbation (HCC) [J44.1] 04/01/2016  . Suicidal ideations [R45.851] 08/17/2013    Past Psychiatric History:   Past Medical History:  Past Medical History:  Diagnosis Date  . Anxiety   . Arthritis    "hips; bottom part of my back" (07/09/2016)  . Asthmatic bronchitis   . Bipolar disorder (HCC)   . CHF (congestive heart failure) (HCC)   . Chronic bronchitis (HCC)   . Chronic hip pain   . COPD (chronic obstructive pulmonary disease) (HCC)    . Depression   . GERD (gastroesophageal reflux disease)   . High cholesterol   . History of hiatal hernia   . Hypertension   . Irregular heart beats   . Pneumonia    "3 times; it's been awhile" (07/09/2016)  . Schizophrenia (HCC)   . Type II diabetes mellitus (HCC)     Past Surgical History:  Procedure Laterality Date  . CLOSED REDUCTION HAND FRACTURE Right    "got 6 screws and a plate in there"  . ELBOW FRACTURE SURGERY Left    "put it through the wall"  . FRACTURE SURGERY     Family History:  Family History  Problem Relation Age of Onset  . Asthma Mother   . Heart murmur Sister   . Heart murmur Brother    Family Psychiatric  History:  Social History:  History  Alcohol Use  . 12.0 oz/week  . 20 Standard drinks or equivalent per week    Comment: 07/09/2016 "I had quit for 1 yr, relapsed 3 days ago"     History  Drug Use  . Types: "Crack" cocaine, Cocaine    Comment: 07/09/2016 "I had quit for 1 yr, relapsed 3 days ago"    Social History   Social History  . Marital status: Divorced    Spouse name: N/A  . Number of children: N/A  . Years of education: N/A   Social History Main Topics  . Smoking status: Heavy Tobacco Smoker    Packs/day: 0.50    Years: 41.00    Types: Cigarettes    Last attempt to quit: 04/08/2016  . Smokeless tobacco: Never Used  . Alcohol use  12.0 oz/week    20 Standard drinks or equivalent per week     Comment: 07/09/2016 "I had quit for 1 yr, relapsed 3 days ago"  . Drug use: Yes    Types: "Crack" cocaine, Cocaine     Comment: 07/09/2016 "I had quit for 1 yr, relapsed 3 days ago"  . Sexual activity: Not Currently   Other Topics Concern  . None   Social History Narrative  . None    Hospital Course:  Leanord AsalJohn Berch was admitted for HCAP (healthcare-associated pneumonia) suicidal ideations and crisis management.  Pt was treated discharged with the medications listed below under Medication List.  Medical problems were identified and treated as  needed.  Home medications were restarted as appropriate [facility]        [name] was evaluated by the treatment team for stability and plans will be continue after medical clearances.   .   Physical Findings: AIMS:  , ,  ,  ,    CIWA:    COWS:     Musculoskeletal: Strength & Muscle Tone: UTA patietn was resting in bed throughout the day Gait & Station: UTA- walker at bedside Patient leans: N/A  Psychiatric Specialty Exam: See SRA by MD Physical Exam  Vitals reviewed. Neurological: He is alert.  Psychiatric: He has a normal mood and affect. His behavior is normal.    Review of Systems  Gastrointestinal: Positive for abdominal pain, diarrhea, nausea and vomiting.  Psychiatric/Behavioral: Positive for depression.    Blood pressure 104/63, pulse 66, temperature 98 F (36.7 C), temperature source Axillary, resp. rate 18, height 5\' 11"  (1.803 m), weight 83.8 kg (184 lb 11.9 oz), SpO2 96 %.Body mass index is 25.77 kg/m.     Has this patient used any form of tobacco in the last 30 days? (Cigarettes, Smokeless Tobacco, Cigars, and/or Pipes)No  Blood Alcohol level:  Lab Results  Component Value Date   ETH <5 12/09/2016   ETH <5 08/30/2016    Metabolic Disorder Labs:  Lab Results  Component Value Date   HGBA1C 6.0 (H) 12/12/2016   MPG 126 12/12/2016   MPG 137 11/04/2016   Lab Results  Component Value Date   PROLACTIN 53.1 (H) 07/18/2016   Lab Results  Component Value Date   CHOL 220 (H) 12/12/2016   TRIG 76 12/12/2016   HDL 42 12/12/2016   CHOLHDL 5.2 12/12/2016   VLDL 15 12/12/2016   LDLCALC 163 (H) 12/12/2016   LDLCALC 111 (H) 07/18/2016    See Psychiatric Specialty Exam and Suicide Risk Assessment completed by Attending Physician prior to discharge.  Discharge destination:  Other:  hospital for medical clearance  Is patient on multiple antipsychotic therapies at discharge:  No   Has Patient had three or more  failed trials of antipsychotic monotherapy by history:  No  Recommended Plan for Multiple Antipsychotic Therapies: NA      Follow-up recommendations:  Activity:  as tolerated Diet:  heart healthy   Comments:  Take all medications as prescribed. Keep all follow-up appointments as scheduled.  Do not consume alcohol or use illegal drugs while on prescription medications. Report any adverse effects from your medications to your primary care provider promptly.  In the event of recurrent symptoms or worsening symptoms, call 911, a crisis hotline, or go to the nearest emergency department for evaluation.   Signed: Oneta Rackanika N Lewis, NP 12/16/2016, 9:10 AM

## 2016-12-16 NOTE — Progress Notes (Signed)
  [facility] Adult Case Management Discharge Plan :  Will you be returning to the same living situation after discharge:  No.Transfer to medical floor At discharge, do you have transportation home?: Yes, EMS Do you have the ability to pay for your medications: Yes,  no concerns expressed, has insurance  Release of information consent forms completed and in the chart;  Patient's signature needed at discharge.  Patient to Follow up at: Follow-up Information    [facility] up.   Why:  Patient transferred to medical floor for continued care Contact information: [address] [place] [phone]          Next level of care provider has access to [facility] Link:yes  Safety Planning and Suicide Prevention discussed: Yes,  reviewed w patient, patient did not consent to collateral contact     Has patient been referred to the Quitline?: Patient refused referral  Patient has been referred for addiction treatment: N/A, transfer to medical floor for continued treatment. [facility] ACT team notified ([facility]) of transfer and will continue to follow and assist as needed.    Sallee Langenne C Zayne Draheim 12/16/2016, 11:35 AM

## 2016-12-17 ENCOUNTER — Inpatient Hospital Stay (HOSPITAL_COMMUNITY)
Admission: AD | Admit: 2016-12-17 | Discharge: 2016-12-18 | DRG: 885 | Disposition: A | Discharge status: Home / Self Care | Payer: Medicaid Other | Source: Intra-hospital | Attending: Psychiatry | Admitting: Psychiatry

## 2016-12-17 ENCOUNTER — Encounter (HOSPITAL_COMMUNITY): Payer: Self-pay

## 2016-12-17 DIAGNOSIS — F17210 Nicotine dependence, cigarettes, uncomplicated: Secondary | ICD-10-CM | POA: Diagnosis present

## 2016-12-17 DIAGNOSIS — F419 Anxiety disorder, unspecified: Secondary | ICD-10-CM | POA: Diagnosis present

## 2016-12-17 DIAGNOSIS — E785 Hyperlipidemia, unspecified: Secondary | ICD-10-CM | POA: Diagnosis present

## 2016-12-17 DIAGNOSIS — F1910 Other psychoactive substance abuse, uncomplicated: Secondary | ICD-10-CM | POA: Diagnosis not present

## 2016-12-17 DIAGNOSIS — N400 Enlarged prostate without lower urinary tract symptoms: Secondary | ICD-10-CM | POA: Diagnosis present

## 2016-12-17 DIAGNOSIS — K219 Gastro-esophageal reflux disease without esophagitis: Secondary | ICD-10-CM | POA: Diagnosis present

## 2016-12-17 DIAGNOSIS — F251 Schizoaffective disorder, depressive type: Secondary | ICD-10-CM

## 2016-12-17 DIAGNOSIS — J189 Pneumonia, unspecified organism: Secondary | ICD-10-CM | POA: Diagnosis not present

## 2016-12-17 DIAGNOSIS — Z8701 Personal history of pneumonia (recurrent): Secondary | ICD-10-CM

## 2016-12-17 DIAGNOSIS — R45850 Homicidal ideations: Secondary | ICD-10-CM | POA: Diagnosis present

## 2016-12-17 DIAGNOSIS — R45851 Suicidal ideations: Secondary | ICD-10-CM

## 2016-12-17 DIAGNOSIS — Z8249 Family history of ischemic heart disease and other diseases of the circulatory system: Secondary | ICD-10-CM

## 2016-12-17 DIAGNOSIS — E7800 Pure hypercholesterolemia, unspecified: Secondary | ICD-10-CM | POA: Diagnosis present

## 2016-12-17 DIAGNOSIS — F250 Schizoaffective disorder, bipolar type: Principal | ICD-10-CM | POA: Diagnosis present

## 2016-12-17 DIAGNOSIS — F1490 Cocaine use, unspecified, uncomplicated: Secondary | ICD-10-CM | POA: Diagnosis not present

## 2016-12-17 DIAGNOSIS — E119 Type 2 diabetes mellitus without complications: Secondary | ICD-10-CM | POA: Diagnosis present

## 2016-12-17 DIAGNOSIS — G4700 Insomnia, unspecified: Secondary | ICD-10-CM | POA: Diagnosis present

## 2016-12-17 DIAGNOSIS — Z825 Family history of asthma and other chronic lower respiratory diseases: Secondary | ICD-10-CM

## 2016-12-17 DIAGNOSIS — F259 Schizoaffective disorder, unspecified: Secondary | ICD-10-CM | POA: Diagnosis present

## 2016-12-17 LAB — BASIC METABOLIC PANEL
Anion gap: 10 (ref 5–15)
BUN: 8 mg/dL (ref 6–20)
CO2: 20 mmol/L — ABNORMAL LOW (ref 22–32)
Calcium: 8.7 mg/dL — ABNORMAL LOW (ref 8.9–10.3)
Chloride: 109 mmol/L (ref 101–111)
Creatinine, Ser: 0.87 mg/dL (ref 0.61–1.24)
GFR calc Af Amer: >60 mL/min (ref >60)
GFR calc non Af Amer: >60 mL/min (ref >60)
Glucose, Bld: 167 mg/dL — ABNORMAL HIGH (ref 65–99)
Potassium: 3 mmol/L — ABNORMAL LOW (ref 3.5–5.1)
SODIUM: 139 mmol/L (ref 135–145)

## 2016-12-17 LAB — CBC
HEMATOCRIT: 35.3 % — AB (ref 39.0–52.0)
HEMOGLOBIN: 12 g/dL — AB (ref 13.0–17.0)
MCH: 28.8 pg (ref 26.0–34.0)
MCHC: 34 g/dL (ref 30.0–36.0)
MCV: 84.9 fL (ref 78.0–100.0)
Platelets: 220 10*3/uL (ref 150–400)
RBC: 4.16 MIL/uL — AB (ref 4.22–5.81)
RDW: 14.8 % (ref 11.5–15.5)
WBC: 7.1 10*3/uL (ref 4.0–10.5)

## 2016-12-17 LAB — EXPECTORATED SPUTUM ASSESSMENT W GRAM STAIN, RFLX TO RESP C

## 2016-12-17 LAB — MAGNESIUM: MAGNESIUM: 1.8 mg/dL (ref 1.7–2.4)

## 2016-12-17 MED ORDER — ALBUTEROL SULFATE HFA 108 (90 BASE) MCG/ACT IN AERS: 1.0000 | INHALATION_SPRAY | Freq: Four times a day (QID) | RESPIRATORY_TRACT | Status: DC | PRN

## 2016-12-17 MED ORDER — ONDANSETRON 4 MG PO TBDP: 8.0000 mg | ORAL_TABLET | Freq: Three times a day (TID) | ORAL | Status: DC | PRN

## 2016-12-17 MED ORDER — POTASSIUM CHLORIDE CRYS ER 20 MEQ PO TBCR
40.0000 meq | EXTENDED_RELEASE_TABLET | ORAL | Status: DC
  Administered 2016-12-17: 40 meq via ORAL
  Filled 2016-12-17: qty 2

## 2016-12-17 MED ORDER — VITAMIN D3 25 MCG (1000 UNIT) PO TABS
1000.0000 [IU] | ORAL_TABLET | Freq: Two times a day (BID) | ORAL | Status: DC
  Administered 2016-12-17: 1000 [IU] via ORAL
  Filled 2016-12-17 (×6): qty 1

## 2016-12-17 MED ORDER — FINASTERIDE 5 MG PO TABS
5.0000 mg | ORAL_TABLET | Freq: Every day | ORAL | Status: DC
  Administered 2016-12-18: 5 mg via ORAL
  Filled 2016-12-17 (×3): qty 1

## 2016-12-17 MED ORDER — DULOXETINE HCL 30 MG PO CPEP
30.0000 mg | ORAL_CAPSULE | Freq: Every day | ORAL | Status: DC
  Administered 2016-12-18: 30 mg via ORAL
  Filled 2016-12-17 (×4): qty 1

## 2016-12-17 MED ORDER — MOMETASONE FURO-FORMOTEROL FUM 100-5 MCG/ACT IN AERO
2.0000 | INHALATION_SPRAY | Freq: Two times a day (BID) | RESPIRATORY_TRACT | Status: DC
  Administered 2016-12-17 – 2016-12-18 (×2): 2 via RESPIRATORY_TRACT
  Filled 2016-12-17: qty 8.8

## 2016-12-17 MED ORDER — MAGNESIUM OXIDE -MG SUPPLEMENT 400 (240 MG) MG PO TABS: 400.0000 mg | ORAL_TABLET | Freq: Every day | ORAL | 0 refills | Status: DC

## 2016-12-17 MED ORDER — FLUPHENAZINE HCL 10 MG PO TABS
10.0000 mg | ORAL_TABLET | Freq: Every day | ORAL | Status: DC
  Administered 2016-12-18: 10 mg via ORAL
  Filled 2016-12-17 (×3): qty 1

## 2016-12-17 MED ORDER — THEOPHYLLINE ER 300 MG PO CP24
300.0000 mg | ORAL_CAPSULE | Freq: Every day | ORAL | Status: DC
  Filled 2016-12-17: qty 1

## 2016-12-17 MED ORDER — PREDNISONE 20 MG PO TABS: 20.0000 mg | ORAL_TABLET | Freq: Every day | ORAL | Status: DC

## 2016-12-17 MED ORDER — GUAIFENESIN ER 600 MG PO TB12
600.0000 mg | ORAL_TABLET | Freq: Two times a day (BID) | ORAL | Status: DC
  Administered 2016-12-17 – 2016-12-18 (×2): 600 mg via ORAL
  Filled 2016-12-17 (×6): qty 1

## 2016-12-17 MED ORDER — SENNOSIDES-DOCUSATE SODIUM 8.6-50 MG PO TABS
1.0000 | ORAL_TABLET | Freq: Every day | ORAL | 0 refills | Status: DC
Start: 2016-12-17 — End: 2016-12-18

## 2016-12-17 MED ORDER — METRONIDAZOLE 500 MG PO TABS
500.0000 mg | ORAL_TABLET | Freq: Three times a day (TID) | ORAL | Status: DC
  Administered 2016-12-17 – 2016-12-18 (×4): 500 mg via ORAL
  Filled 2016-12-17 (×9): qty 1

## 2016-12-17 MED ORDER — DOXYCYCLINE HYCLATE 100 MG PO TABS
100.0000 mg | ORAL_TABLET | Freq: Two times a day (BID) | ORAL | Status: DC
  Administered 2016-12-17 – 2016-12-18 (×2): 100 mg via ORAL
  Filled 2016-12-17 (×6): qty 1

## 2016-12-17 MED ORDER — TIOTROPIUM BROMIDE MONOHYDRATE 18 MCG IN CAPS
18.0000 ug | ORAL_CAPSULE | Freq: Every day | RESPIRATORY_TRACT | Status: DC
  Administered 2016-12-17 – 2016-12-18 (×2): 18 ug via RESPIRATORY_TRACT
  Filled 2016-12-17: qty 5

## 2016-12-17 MED ORDER — DOXYCYCLINE HYCLATE 100 MG PO TABS: 100.0000 mg | ORAL_TABLET | Freq: Two times a day (BID) | ORAL | 0 refills | Status: DC

## 2016-12-17 MED ORDER — DOXYCYCLINE HYCLATE 100 MG PO TABS
100.0000 mg | ORAL_TABLET | Freq: Two times a day (BID) | ORAL | Status: DC
  Administered 2016-12-17: 100 mg via ORAL
  Filled 2016-12-17: qty 1

## 2016-12-17 MED ORDER — FLUPHENAZINE HCL 10 MG PO TABS: 10.0000 mg | ORAL_TABLET | ORAL | Status: DC

## 2016-12-17 MED ORDER — PREDNISONE 10 MG PO TABS: 10.0000 mg | ORAL_TABLET | Freq: Every day | ORAL | Status: DC

## 2016-12-17 MED ORDER — DIVALPROEX SODIUM 500 MG PO DR TAB
500.0000 mg | DELAYED_RELEASE_TABLET | Freq: Two times a day (BID) | ORAL | Status: DC
  Administered 2016-12-17 – 2016-12-18 (×2): 500 mg via ORAL
  Filled 2016-12-17 (×7): qty 1

## 2016-12-17 MED ORDER — FOLIC ACID 1 MG PO TABS
1.0000 mg | ORAL_TABLET | Freq: Every day | ORAL | Status: DC
  Administered 2016-12-17 – 2016-12-18 (×2): 1 mg via ORAL
  Filled 2016-12-17 (×5): qty 1

## 2016-12-17 MED ORDER — TAMSULOSIN HCL 0.4 MG PO CAPS
0.4000 mg | ORAL_CAPSULE | Freq: Every day | ORAL | Status: DC
  Administered 2016-12-18: 0.4 mg via ORAL
  Filled 2016-12-17 (×3): qty 1

## 2016-12-17 MED ORDER — MAGNESIUM OXIDE -MG SUPPLEMENT 400 (240 MG) MG PO TABS
400.0000 mg | ORAL_TABLET | Freq: Every day | ORAL | Status: DC
  Administered 2016-12-17: 400 mg via ORAL
  Filled 2016-12-17 (×4): qty 1

## 2016-12-17 MED ORDER — FLUTICASONE PROPIONATE 50 MCG/ACT NA SUSP
1.0000 | Freq: Every day | NASAL | Status: DC
  Administered 2016-12-17 – 2016-12-18 (×2): 1 via NASAL
  Filled 2016-12-17: qty 16

## 2016-12-17 MED ORDER — ATORVASTATIN CALCIUM 20 MG PO TABS
20.0000 mg | ORAL_TABLET | Freq: Every day | ORAL | Status: DC
  Administered 2016-12-17 – 2016-12-18 (×2): 20 mg via ORAL
  Filled 2016-12-17 (×2): qty 1
  Filled 2016-12-17: qty 2
  Filled 2016-12-17 (×2): qty 1

## 2016-12-17 MED ORDER — POTASSIUM CHLORIDE ER 10 MEQ PO TBCR
10.0000 meq | EXTENDED_RELEASE_TABLET | Freq: Every day | ORAL | Status: DC
  Administered 2016-12-17 – 2016-12-18 (×2): 10 meq via ORAL
  Filled 2016-12-17 (×5): qty 1

## 2016-12-17 MED ORDER — PANTOPRAZOLE SODIUM 40 MG PO TBEC
40.0000 mg | DELAYED_RELEASE_TABLET | Freq: Two times a day (BID) | ORAL | Status: DC
  Administered 2016-12-17: 40 mg via ORAL
  Filled 2016-12-17: qty 1

## 2016-12-17 MED ORDER — FERROUS GLUCONATE 324 (38 FE) MG PO TABS
324.0000 mg | ORAL_TABLET | Freq: Every day | ORAL | Status: DC
  Filled 2016-12-17: qty 1

## 2016-12-17 MED ORDER — FAMOTIDINE 20 MG PO TABS: 10.0000 mg | ORAL_TABLET | Freq: Two times a day (BID) | ORAL | Status: DC | PRN

## 2016-12-17 MED ORDER — PREDNISONE 20 MG PO TABS
40.0000 mg | ORAL_TABLET | Freq: Every day | ORAL | Status: DC
  Filled 2016-12-17: qty 2

## 2016-12-17 MED ORDER — THEOPHYLLINE ER 300 MG PO TB12
150.0000 mg | ORAL_TABLET | Freq: Two times a day (BID) | ORAL | Status: DC
  Administered 2016-12-17 – 2016-12-18 (×2): 150 mg via ORAL
  Filled 2016-12-17 (×6): qty 1

## 2016-12-17 MED ORDER — PREDNISONE 50 MG PO TABS
50.0000 mg | ORAL_TABLET | Freq: Every day | ORAL | Status: AC
  Administered 2016-12-18: 50 mg via ORAL
  Filled 2016-12-17: qty 1

## 2016-12-17 MED ORDER — POTASSIUM CHLORIDE ER 10 MEQ PO TBCR: 10.0000 meq | EXTENDED_RELEASE_TABLET | Freq: Every day | ORAL | 0 refills | Status: DC

## 2016-12-17 MED ORDER — SENNOSIDES-DOCUSATE SODIUM 8.6-50 MG PO TABS
1.0000 | ORAL_TABLET | Freq: Every day | ORAL | Status: DC
  Administered 2016-12-17: 1 via ORAL
  Filled 2016-12-17 (×3): qty 1

## 2016-12-17 MED ORDER — CLINDAMYCIN HCL 300 MG PO CAPS
300.0000 mg | ORAL_CAPSULE | Freq: Four times a day (QID) | ORAL | Status: DC
  Filled 2016-12-17: qty 1

## 2016-12-17 MED ORDER — AMLODIPINE BESYLATE 5 MG PO TABS
5.0000 mg | ORAL_TABLET | Freq: Every day | ORAL | Status: DC
  Administered 2016-12-18: 5 mg via ORAL
  Filled 2016-12-17 (×3): qty 1

## 2016-12-17 MED ORDER — GUAIFENESIN ER 600 MG PO TB12: 600.0000 mg | ORAL_TABLET | Freq: Two times a day (BID) | ORAL | 0 refills | Status: DC

## 2016-12-17 MED ORDER — FLUPHENAZINE HCL 10 MG PO TABS
20.0000 mg | ORAL_TABLET | Freq: Every day | ORAL | Status: DC
  Administered 2016-12-17: 20 mg via ORAL
  Filled 2016-12-17 (×2): qty 2
  Filled 2016-12-17: qty 4
  Filled 2016-12-17: qty 2

## 2016-12-17 MED ORDER — PREDNISONE 10 MG PO TABS: ORAL_TABLET | ORAL | 0 refills | Status: DC

## 2016-12-17 MED ORDER — PREDNISONE 20 MG PO TABS
30.0000 mg | ORAL_TABLET | Freq: Every day | ORAL | Status: DC
  Filled 2016-12-17: qty 1

## 2016-12-17 MED ORDER — METRONIDAZOLE 500 MG PO TABS: 500.0000 mg | ORAL_TABLET | Freq: Three times a day (TID) | ORAL | 0 refills | Status: DC

## 2016-12-17 MED ORDER — FERROUS SULFATE 325 (65 FE) MG PO TABS
325.0000 mg | ORAL_TABLET | Freq: Every day | ORAL | Status: DC
  Administered 2016-12-18: 325 mg via ORAL
  Filled 2016-12-17 (×3): qty 1

## 2016-12-17 MED ORDER — METRONIDAZOLE 500 MG PO TABS
500.0000 mg | ORAL_TABLET | Freq: Three times a day (TID) | ORAL | Status: DC
  Administered 2016-12-17: 500 mg via ORAL
  Filled 2016-12-17: qty 1

## 2016-12-17 NOTE — Tx Team (Signed)
Initial Treatment Plan 12/17/2016 3:20 PM Francisco Beard ZOX:[id number]    PATIENT STRESSORS: Financial difficulties Health problems Substance abuse   PATIENT STRENGTHS: Communication skills General fund of knowledge Motivation for treatment/growth Other: Has an ACTT team   PATIENT IDENTIFIED PROBLEMS: Depression  Homicidal ideation  Suicidal ideation  Psychosis  "Get off Crack"  "Get better housing"           DISCHARGE CRITERIA:  Ability to meet basic life and health needs Adequate post-discharge living arrangements Improved stabilization in mood, thinking, and/or behavior Verbal commitment to aftercare and medication compliance  PRELIMINARY DISCHARGE PLAN: Outpatient therapy Medication management  PATIENT/FAMILY INVOLVEMENT: This treatment plan has been presented to and reviewed with the patient, Francisco Beard.  The patient and family have been given the opportunity to ask questions and make suggestions.  Levin BaconHeather V Hildred Mollica, RN 12/17/2016, 3:20 PM

## 2016-12-17 NOTE — Consult Note (Signed)
[name] Consult   Reason for Consult:  Bipolar depression, substance abuse, SI/HI Referring Physician:  Dr. Erlinda Hong Patient Identification: Francisco Beard MRN:  [id number] Principal Diagnosis: HCAP (healthcare-associated pneumonia) Diagnosis:   Patient Active Problem List   Diagnosis Date Noted  . HCAP (healthcare-associated pneumonia) [J18.9] 12/15/2016  . Hyperlipidemia [E78.5] 12/15/2016  . Schizoaffective disorder ([place]) [F25.9] 12/10/2016  . Acute respiratory failure ([place]) [J96.00] 11/04/2016  . Allergic rhinitis [J30.9] 11/04/2016  . Tobacco use disorder [F17.200] 11/04/2016  . BPH (benign prostatic hyperplasia) [N40.0] 11/04/2016  . Acute exacerbation of COPD with asthma ([place]) [J44.1, T41.962] 07/09/2016  . Polysubstance abuse [F19.10] 07/09/2016  . Homicidal ideation [R45.850]   . HTN (hypertension) [I10] 04/17/2016  . Chest pain [R07.9]   . COPD exacerbation ([place]) [J44.1] 04/13/2016  . COPD (chronic obstructive pulmonary disease) ([place]) [J44.9] 04/13/2016  . Avascular necrosis of bone of right hip ([place]) [M87.051] 04/08/2016  . COPD with acute exacerbation ([place]) [J44.1] 04/01/2016  . Suicidal ideations [R45.851] 08/17/2013    Total Time spent with patient: 1 hour  Subjective:   Francisco Beard is a 56 y.o. male patient admitted with nausea, abd pain and pneumonia.  HPI:  Francisco Beard is a 56 y.o. male with medical history significant of anxiety, depression, bipolar disorder, schizoaffective disorder, asthmatic bronchitis, history of CHF (had normal echo in February of this year), COPD, chronic hip pain, GERD, hiatal hernia, hyperlipidemia, history of pneumonia 3, type 2 diabetes who was transferred from the behavioral health [facility] for the second day in a row for evaluation of upper abdominal pain, associated with nausea and vomiting.   Patient seen, chart reviewed, case discussed with Dr. Erlinda Hong, CSW and TTS. Patient continue to endorse suicide and homicide thoughts.  He is able to walk to bathroom with walker without assistance. Dr. Erlinda Hong reported he is medically stable for transfer. CSW is able communicate to [facility]. Patient has bed at [facility] and will be transferring today for further mental health treatment and than send him back to [facility] of care when psychiatrically stable.    Past Psychiatric History:  History significant of anxiety, depression, bipolar disorder, schizoaffective disorder, cocaine abuse and multiple [facility] admissions  Risk to Self: Is patient at risk for suicide?: No Risk to Others:   Prior Inpatient Therapy:   Prior Outpatient Therapy:    Past Medical History:  Past Medical History:  Diagnosis Date  . Anxiety   . Arthritis    "hips; bottom part of my back" (07/09/2016)  . Asthmatic bronchitis   . Bipolar disorder ([place])   . CHF (congestive heart failure) ([place])   . Chronic bronchitis ([place])   . Chronic hip pain   . COPD (chronic obstructive pulmonary disease) ([place])   . Depression   . GERD (gastroesophageal reflux disease)   . High cholesterol   . History of hiatal hernia   . Hypertension   . Irregular heart beats   . Pneumonia    "3 times; it's been awhile" (07/09/2016)  . Schizophrenia ([place])   . Type II diabetes mellitus ([place])     Past Surgical History:  Procedure Laterality Date  . CLOSED REDUCTION HAND FRACTURE Right    "got 6 screws and a plate in there"  . ELBOW FRACTURE SURGERY Left    "put it through the wall"  . FRACTURE SURGERY     Family History:  Family History  Problem Relation Age of Onset  . Asthma Mother   . Heart murmur Sister   .  Heart murmur Brother    Family Psychiatric  History: Unknown. Social History:  History  Alcohol Use  . 12.0 oz/week  . 20 Standard drinks or equivalent per week    Comment: 07/09/2016 "I had quit for 1 yr, relapsed 3 days ago"     History  Drug Use  . Types: "Crack" cocaine, Cocaine    Comment: 07/09/2016 "I had quit for 1 yr, relapsed 3 days ago"    Social History    Social History  . Marital status: Divorced    Spouse name: N/A  . Number of children: N/A  . Years of education: N/A   Social History Main Topics  . Smoking status: Heavy Tobacco Smoker    Packs/day: 0.50    Years: 41.00    Types: Cigarettes    Last attempt to quit: 04/08/2016  . Smokeless tobacco: Never Used  . Alcohol use 12.0 oz/week    20 Standard drinks or equivalent per week     Comment: 07/09/2016 "I had quit for 1 yr, relapsed 3 days ago"  . Drug use: Yes    Types: "Crack" cocaine, Cocaine     Comment: 07/09/2016 "I had quit for 1 yr, relapsed 3 days ago"  . Sexual activity: Not Currently   Other Topics Concern  . None   Social History Narrative  . None   Additional Social History:    Allergies:   Allergies  Allergen Reactions  . Aleve [Naproxen] Other (See Comments)    Heart problems so MD told him to not take  . Aspirin Swelling  . Ibuprofen Swelling  . Penicillins Swelling    Has patient had a PCN reaction causing immediate rash, facial/tongue/throat swelling, SOB or lightheadedness with hypotension: Yes Has patient had a PCN reaction causing severe rash involving mucus membranes or skin necrosis: Yes Has patient had a PCN reaction that required hospitalization: Yes Has patient had a PCN reaction occurring within the last 10 years: No If all of the above answers are "NO", then may proceed with Cephalosporin use.   . Seroquel [Quetiapine] Other (See Comments)    In higher doses, this causes excessive sedation  . Strawberry Extract Anaphylaxis  . Tomato Anaphylaxis  . Onion Nausea And Vomiting and Rash    Labs:  Results for orders placed or performed during the hospital encounter of 12/15/16 (from the past 48 hour(s))  Urinalysis, Routine w reflex microscopic     Status: Abnormal   Collection Time: 12/15/16  4:02 PM  Result Value Ref Range   Color, Urine STRAW (A) YELLOW   APPearance CLEAR CLEAR   Specific Gravity, Urine 1.013 1.005 - 1.030   pH 7.0  5.0 - 8.0   Glucose, UA NEGATIVE NEGATIVE mg/dL   Hgb urine dipstick NEGATIVE NEGATIVE   Bilirubin Urine NEGATIVE NEGATIVE   Ketones, ur NEGATIVE NEGATIVE mg/dL   Protein, ur NEGATIVE NEGATIVE mg/dL   Nitrite NEGATIVE NEGATIVE   Leukocytes, UA NEGATIVE NEGATIVE  CBC with Differential/Platelet     Status: Abnormal   Collection Time: 12/15/16  5:16 PM  Result Value Ref Range   WBC 7.1 4.0 - 10.5 K/uL   RBC 4.42 4.22 - 5.81 MIL/uL   Hemoglobin 12.7 (L) 13.0 - 17.0 g/dL   HCT 37.8 (L) 39.0 - 52.0 %   MCV 85.5 78.0 - 100.0 fL   MCH 28.7 26.0 - 34.0 pg   MCHC 33.6 30.0 - 36.0 g/dL   RDW 15.5 11.5 - 15.5 %   Platelets 242  150 - 400 K/uL   Neutrophils Relative % 71 %   Neutro Abs 5.0 1.7 - 7.7 K/uL   Lymphocytes Relative 18 %   Lymphs Abs 1.3 0.7 - 4.0 K/uL   Monocytes Relative 11 %   Monocytes Absolute 0.8 0.1 - 1.0 K/uL   Eosinophils Relative 0 %   Eosinophils Absolute 0.0 0.0 - 0.7 K/uL   Basophils Relative 0 %   Basophils Absolute 0.0 0.0 - 0.1 K/uL  Comprehensive metabolic panel     Status: Abnormal   Collection Time: 12/15/16  5:16 PM  Result Value Ref Range   Sodium 140 135 - 145 mmol/L   Potassium 3.9 3.5 - 5.1 mmol/L   Chloride 111 101 - 111 mmol/L   CO2 23 22 - 32 mmol/L   Glucose, Bld 90 65 - 99 mg/dL   BUN 10 6 - 20 mg/dL   Creatinine, Ser 0.97 0.61 - 1.24 mg/dL   Calcium 8.8 (L) 8.9 - 10.3 mg/dL   Total Protein 6.6 6.5 - 8.1 g/dL   Albumin 3.7 3.5 - 5.0 g/dL   AST 26 15 - 41 U/L   ALT 23 17 - 63 U/L   Alkaline Phosphatase 52 38 - 126 U/L   Total Bilirubin 0.5 0.3 - 1.2 mg/dL   GFR calc non Af Amer >60 >60 mL/min   GFR calc Af Amer >60 >60 mL/min    Comment: (NOTE) The eGFR has been calculated using the CKD EPI equation. This calculation has not been validated in all clinical situations. eGFR's persistently <60 mL/min signify possible Chronic Kidney Disease.    Anion gap 6 5 - 15  Lipase, blood     Status: None   Collection Time: 12/15/16  5:16 PM  Result  Value Ref Range   Lipase 14 11 - 51 U/L  Strep pneumoniae urinary antigen     Status: None   Collection Time: 12/16/16  3:02 AM  Result Value Ref Range   Strep Pneumo Urinary Antigen NEGATIVE NEGATIVE    Comment:        Infection due to S. pneumoniae cannot be absolutely ruled out since the antigen present may be below the detection limit of the test.   CBC     Status: Abnormal   Collection Time: 12/16/16  5:19 AM  Result Value Ref Range   WBC 5.0 4.0 - 10.5 K/uL   RBC 4.35 4.22 - 5.81 MIL/uL   Hemoglobin 12.2 (L) 13.0 - 17.0 g/dL   HCT 36.4 (L) 39.0 - 52.0 %   MCV 83.7 78.0 - 100.0 fL   MCH 28.0 26.0 - 34.0 pg   MCHC 33.5 30.0 - 36.0 g/dL   RDW 15.3 11.5 - 15.5 %   Platelets 230 150 - 400 K/uL  Basic metabolic panel     Status: Abnormal   Collection Time: 12/16/16  5:19 AM  Result Value Ref Range   Sodium 138 135 - 145 mmol/L   Potassium 3.8 3.5 - 5.1 mmol/L   Chloride 111 101 - 111 mmol/L   CO2 20 (L) 22 - 32 mmol/L   Glucose, Bld 84 65 - 99 mg/dL   BUN 9 6 - 20 mg/dL   Creatinine, Ser 0.89 0.61 - 1.24 mg/dL   Calcium 8.5 (L) 8.9 - 10.3 mg/dL   GFR calc non Af Amer >60 >60 mL/min   GFR calc Af Amer >60 >60 mL/min    Comment: (NOTE) The eGFR has been calculated using the CKD EPI  equation. This calculation has not been validated in all clinical situations. eGFR's persistently <60 mL/min signify possible Chronic Kidney Disease.    Anion gap 7 5 - 15  Urine rapid drug screen (hosp performed)     Status: Abnormal   Collection Time: 12/16/16 10:24 AM  Result Value Ref Range   Opiates POSITIVE (A) NONE DETECTED   Cocaine NONE DETECTED NONE DETECTED   Benzodiazepines NONE DETECTED NONE DETECTED   Amphetamines NONE DETECTED NONE DETECTED   Tetrahydrocannabinol NONE DETECTED NONE DETECTED   Barbiturates NONE DETECTED NONE DETECTED    Comment:        DRUG SCREEN FOR MEDICAL PURPOSES ONLY.  IF CONFIRMATION IS NEEDED FOR ANY PURPOSE, NOTIFY LAB WITHIN 5 DAYS.         LOWEST DETECTABLE LIMITS FOR URINE DRUG SCREEN Drug Class       Cutoff (ng/mL) Amphetamine      1000 Barbiturate      200 Benzodiazepine   201 Tricyclics       007 Opiates          300 Cocaine          300 THC              50   MRSA PCR Screening     Status: None   Collection Time: 12/16/16 10:34 AM  Result Value Ref Range   MRSA by PCR NEGATIVE NEGATIVE    Comment:        The GeneXpert MRSA Assay (FDA approved for NASAL specimens only), is one component of a comprehensive MRSA colonization surveillance program. It is not intended to diagnose MRSA infection nor to guide or monitor treatment for MRSA infections.   CBC     Status: Abnormal   Collection Time: 12/17/16  9:23 AM  Result Value Ref Range   WBC 7.1 4.0 - 10.5 K/uL   RBC 4.16 (L) 4.22 - 5.81 MIL/uL   Hemoglobin 12.0 (L) 13.0 - 17.0 g/dL   HCT 35.3 (L) 39.0 - 52.0 %   MCV 84.9 78.0 - 100.0 fL   MCH 28.8 26.0 - 34.0 pg   MCHC 34.0 30.0 - 36.0 g/dL   RDW 14.8 11.5 - 15.5 %   Platelets 220 150 - 400 K/uL  Basic metabolic panel     Status: Abnormal   Collection Time: 12/17/16  9:23 AM  Result Value Ref Range   Sodium 139 135 - 145 mmol/L   Potassium 3.0 (L) 3.5 - 5.1 mmol/L    Comment: REPEATED TO VERIFY DELTA CHECK NOTED    Chloride 109 101 - 111 mmol/L   CO2 20 (L) 22 - 32 mmol/L   Glucose, Bld 167 (H) 65 - 99 mg/dL   BUN 8 6 - 20 mg/dL   Creatinine, Ser 0.87 0.61 - 1.24 mg/dL   Calcium 8.7 (L) 8.9 - 10.3 mg/dL   GFR calc non Af Amer >60 >60 mL/min   GFR calc Af Amer >60 >60 mL/min    Comment: (NOTE) The eGFR has been calculated using the CKD EPI equation. This calculation has not been validated in all clinical situations. eGFR's persistently <60 mL/min signify possible Chronic Kidney Disease.    Anion gap 10 5 - 15  Magnesium     Status: None   Collection Time: 12/17/16  9:23 AM  Result Value Ref Range   Magnesium 1.8 1.7 - 2.4 mg/dL    Current Facility-Administered Medications  Medication  Dose Route Frequency Provider Last Rate Last  Dose  . albuterol (PROVENTIL) (2.5 MG/3ML) 0.083% nebulizer solution 2.5 mg  2.5 mg Nebulization Q2H PRN Reubin Milan, MD   2.5 mg at 12/16/16 1527  . amLODipine (NORVASC) tablet 5 mg  5 mg Oral Daily Reubin Milan, MD   5 mg at 12/17/16 1039  . atorvastatin (LIPITOR) tablet 20 mg  20 mg Oral q1800 Reubin Milan, MD   20 mg at 12/16/16 1712  . cholecalciferol (VITAMIN D) tablet 1,000 Units  1,000 Units Oral BID Reubin Milan, MD   1,000 Units at 12/17/16 1035  . divalproex (DEPAKOTE) DR tablet 500 mg  500 mg Oral BID Reubin Milan, MD   500 mg at 12/17/16 1032  . doxycycline (VIBRA-TABS) tablet 100 mg  100 mg Oral Q12H Florencia Reasons, MD   100 mg at 12/17/16 1245  . DULoxetine (CYMBALTA) DR capsule 30 mg  30 mg Oral Daily Reubin Milan, MD   30 mg at 12/17/16 1033  . famotidine (PEPCID) tablet 10 mg  10 mg Oral BID PRN Reubin Milan, MD      . finasteride [facility]) tablet 5 mg  5 mg Oral Daily Reubin Milan, MD   5 mg at 12/17/16 1033  . guaiFENesin (MUCINEX) 12 hr tablet 600 mg  600 mg Oral BID Florencia Reasons, MD   600 mg at 12/17/16 1033  . HYDROcodone-acetaminophen (NORCO/VICODIN) 5-325 MG per tablet 1 tablet  1 tablet Oral Q6H PRN Florencia Reasons, MD   1 tablet at 12/17/16 [phone]  . ipratropium-albuterol (DUONEB) 0.5-2.5 (3) MG/3ML nebulizer solution 3 mL  3 mL Nebulization Q6H Florencia Reasons, MD   3 mL at 12/17/16 0848  . LORazepam (ATIVAN) injection 1 mg  1 mg Intravenous Q4H PRN Reubin Milan, MD      . metroNIDAZOLE (FLAGYL) tablet 500 mg  500 mg Oral Blair Promise, MD   500 mg at 12/17/16 1248  . mometasone-formoterol (DULERA) 100-5 MCG/ACT inhaler 2 puff  2 puff Inhalation BID Reubin Milan, MD   2 puff at 12/17/16 [phone]  . ondansetron (ZOFRAN) injection 4 mg  4 mg Intravenous Q6H PRN Reubin Milan, MD   4 mg at 12/16/16 0304  . pantoprazole (PROTONIX) EC tablet 40 mg  40 mg Oral BID Florencia Reasons, MD   40 mg at  12/17/16 1035  . predniSONE (DELTASONE) tablet 60 mg  60 mg Oral Q breakfast Florencia Reasons, MD   60 mg at 12/17/16 0816  . senna-docusate (Senokot-S) tablet 1 tablet  1 tablet Oral BID Florencia Reasons, MD   1 tablet at 12/16/16 2121  . tamsulosin (FLOMAX) capsule 0.4 mg  0.4 mg Oral Daily Reubin Milan, MD   0.4 mg at 12/17/16 1033  . theophylline (THEODUR) 12 hr tablet 150 mg  150 mg Oral Q12H Reubin Milan, MD   150 mg at 12/17/16 1036  . thiamine (VITAMIN B-1) tablet 100 mg  100 mg Oral Daily Reubin Milan, MD   100 mg at 12/17/16 1035  . traZODone (DESYREL) tablet 50 mg  50 mg Oral QHS Reubin Milan, MD   50 mg at 12/16/16 2121    Musculoskeletal: Strength & Muscle Tone: decreased Gait & Station: unsteady Patient leans: N/A  Psychiatric Specialty Exam: Physical Exam as per H&P  ROS generalized weakness and improved breathing and no current nausea, vomiting, constipation or chest pain. No Fever-chills, No Headache, No changes with Vision or hearing, reports vertigo No problems  swallowing food or Liquids, No Chest pain, Cough or Shortness of Breath, No Abdominal pain, No Nausea or Vommitting, Bowel movements are regular, No Blood in stool or Urine, No dysuria, No new skin rashes or bruises, No new joints pains-aches,  No new weakness, tingling, numbness in any extremity, No recent weight gain or loss, No polyuria, polydypsia or polyphagia,  A full 10 point Review of Systems was done, except as stated above, all other Review of Systems were negative.  Blood pressure 117/71, pulse 67, temperature 98 F (36.7 C), temperature source Oral, resp. rate 16, height 5' 11"  (1.803 m), weight 83.8 kg (184 lb 11.9 oz), SpO2 95 %.Body mass index is 25.77 kg/m.  General Appearance: Guarded  Eye Contact:  Good  Speech:  Clear and Coherent  Volume:  Decreased  Mood:  Depressed  Affect:  Constricted and Depressed  Thought Process:  Coherent and Goal Directed  Orientation:  Full  (Time, Place, and Person)  Thought Content:  Logical  Suicidal Thoughts:  Yes.  without intent/plan  Homicidal Thoughts:  Yes.  without intent/plan  Memory:  Immediate;   Good Recent;   Fair Remote;   Fair  Judgement:  Impaired  Insight:  Fair  Psychomotor Activity:  Decreased  Concentration:  Concentration: Good and Attention Span: Fair  Recall:  Good  Fund of Knowledge:  Fair  Language:  Good  Akathisia:  Negative  Handed:  Right  AIMS (if indicated):     Assets:  Communication Skills Desire for Improvement Financial Resources/Insurance Housing Leisure Time Resilience Social Support Transportation  ADL's:  Intact  Cognition:  WNL  Sleep:        Treatment Plan Summary: Patient has been diagnosed with schizoaffective disorder, substance abuse and SI/HI, admitted for pneumonia to [facility] and now medically cleared Patient is willing to be transferred for further mental health treatment.  Daily contact with patient to assess and evaluate symptoms and progress in treatment and Medication management   Continue his medication management cymbalta, depakote and trazodone as ordered and has no side effects. No medication changes made during this visit.  Disposition: Recommend psychiatric Inpatient admission when medically cleared. Supportive therapy provided about ongoing stressors.  Ambrose Finland, MD 12/17/2016 1:03 PM

## 2016-12-17 NOTE — Tx Team (Signed)
Interdisciplinary Treatment and Diagnostic Plan Update  12/13/2016 Time of Session: 10:12 AM  Francisco Beard MRN: [id number]  Principal Diagnosis: Schizoaffective disorder (HCC)  Secondary Diagnoses: Principal Problem:   Schizoaffective disorder (HCC) Active Problems:   Polysubstance abuse   Current Medications:  Current Facility-Administered Medications  Medication Dose Route Frequency Provider Last Rate Last Dose  . acetaminophen (TYLENOL) tablet 650 mg  650 mg Oral Q6H PRN Withrow, Jojo C, FNP   650 mg at 12/13/16 0951  . albuterol (PROVENTIL HFA;VENTOLIN HFA) 108 (90 Base) MCG/ACT inhaler 1-2 puff  1-2 puff Inhalation Q6H PRN Withrow, Culley C, FNP   2 puff at 12/12/16 1603  . alum & mag hydroxide-simeth (MAALOX/MYLANTA) 200-200-20 MG/5ML suspension 30 mL  30 mL Oral Q4H PRN Withrow, Thomes C, FNP   30 mL at 12/12/16 1942  . divalproex (DEPAKOTE) DR tablet 500 mg  500 mg Oral Q12H Withrow, Braxson C, FNP   500 mg at 12/13/16 0949  . doxycycline (VIBRA-TABS) tablet 100 mg  100 mg Oral Q12H Kumar, Archana, MD   100 mg at 12/13/16 0948  . DULoxetine (CYMBALTA) DR capsule 30 mg  30 mg Oral Daily Withrow, Durelle C, FNP   30 mg at 12/13/16 0949  . magnesium hydroxide (MILK OF MAGNESIA) suspension 30 mL  30 mL Oral Daily PRN Withrow, Dusty C, FNP   30 mL at 12/13/16 0951  . tamsulosin (FLOMAX) capsule 0.4 mg  0.4 mg Oral Daily Withrow, Jimmy C, FNP   0.4 mg at 12/13/16 0949  . traZODone (DESYREL) tablet 50 mg  50 mg Oral QHS PRN Withrow, Brok C, FNP   50 mg at 12/12/16 2103    PTA Medications: Prescriptions Prior to Admission  Medication Sig Dispense Refill Last Dose  . albuterol (PROVENTIL HFA;VENTOLIN HFA) 108 (90 Base) MCG/ACT inhaler Inhale 1-2 puffs into the lungs every 4 (four) hours as needed for wheezing or shortness of breath. 18 g 2 12/09/2016 at Unknown time  . albuterol (PROVENTIL) (2.5 MG/3ML) 0.083% nebulizer solution Take 3 mLs (2.5 mg total) by nebulization every 4 (four) hours as needed  for wheezing or shortness of breath. 10 vial 0 unknown  . amLODipine (NORVASC) 5 MG tablet Take 1 tablet (5 mg total) by mouth daily. 90 tablet 0 12/08/2016 at Unknown time  . Ascorbic Acid (VITAMIN C PO) Take 1 tablet by mouth daily.   12/09/2016 at Unknown time  . atorvastatin (LIPITOR) 20 MG tablet Take 20 mg by mouth daily.   12/08/2016 at Unknown time  . budesonide-formoterol (SYMBICORT) 80-4.5 MCG/ACT inhaler Inhale 2 puffs into the lungs 2 (two) times daily. 1 Inhaler 12 Past Week at Unknown time  . cholecalciferol (VITAMIN D) 1000 units tablet Take 1,000 Units by mouth 2 (two) times daily.   12/08/2016 at Unknown time  . divalproex (DEPAKOTE) 500 MG DR tablet Take 1 tablet (500 mg total) by mouth 2 (two) times daily. (Patient not taking: Reported on 12/09/2016) 60 tablet 0 Not Taking at Unknown time  . divalproex (DEPAKOTE) 500 MG DR tablet Take 500 mg by mouth 2 (two) times daily.    12/08/2016 at 2000  . DULoxetine (CYMBALTA) 30 MG capsule Take 30 mg by mouth daily.   Past Week at Unknown time  . famotidine (PEPCID AC) 10 MG chewable tablet Chew 10 mg by mouth 2 (two) times daily as needed for heartburn.   Past Week at Unknown time  . ferrous gluconate (FERGON) 324 MG tablet Take 1 tablet (324 mg total)   by mouth daily with breakfast. 90 tablet 3 Past Week at Unknown time  . finasteride (PROSCAR) 5 MG tablet Take 1 tablet (5 mg total) by mouth daily. 30 tablet 2 Past Week at Unknown time  . fluPHENAZine (PROLIXIN) 10 MG tablet Take 10-20 mg by mouth See admin instructions. Pt takes 10mg in am, 20mg in pm   Past Week at Unknown time  . fluticasone (FLONASE) 50 MCG/ACT nasal spray Place 1 spray into both nostrils daily. 16 g 2 12/09/2016 at Unknown time  . folic acid (FOLVITE) 1 MG tablet Take 1 mg by mouth daily.   Past Week at Unknown time  . HYDROcodone-acetaminophen (NORCO/VICODIN) 5-325 MG tablet Take 1 tablet by mouth every 6 (six) hours as needed for moderate pain. (Patient not taking: Reported on  12/09/2016) 20 tablet 0 Not Taking at Unknown time  . pantoprazole (PROTONIX) 40 MG tablet Take 1 tablet (40 mg total) by mouth 2 (two) times daily. 60 tablet 2 12/08/2016 at Unknown time  . tamsulosin (FLOMAX) 0.4 MG CAPS capsule Take 1 capsule (0.4 mg total) by mouth daily. 30 capsule 2 12/09/2016 at Unknown time  . theophylline (THEO-24) 300 MG 24 hr capsule Take 300 mg by mouth daily.   Past Week at Unknown time  . thiamine 100 MG tablet Take 1 tablet (100 mg total) by mouth daily. 30 tablet 0 12/09/2016 at Unknown time  . tiotropium (SPIRIVA HANDIHALER) 18 MCG inhalation capsule Place 1 capsule (18 mcg total) into inhaler and inhale daily. 60 capsule 2 12/09/2016 at Unknown time    Patient Stressors: Financial difficulties Health problems Substance abuse  Patient Strengths: Communication skills General fund of knowledge Motivation for treatment/growth  Treatment Modalities: Medication Management, Group therapy, Case management,  1 to 1 session with clinician, Psychoeducation, Recreational therapy.   Physician Treatment Plan for Primary Diagnosis: Schizoaffective disorder (HCC) Long Term Goal(s): Improvement in symptoms so as ready for discharge  Short Term Goals: Ability to verbalize feelings will improve Ability to demonstrate self-control will improve Ability to identify and develop effective coping behaviors will improve Ability to identify triggers associated with substance abuse/mental health issues will improve Ability to demonstrate self-control will improve Ability to identify and develop effective coping behaviors will improve Ability to identify triggers associated with substance abuse/mental health issues will improve  Medication Management: Evaluate patient's response, side effects, and tolerance of medication regimen.  Therapeutic Interventions: 1 to 1 sessions, Unit Group sessions and Medication administration.  Evaluation of Outcomes: Progressing  Physician Treatment  Plan for Secondary Diagnosis: Principal Problem:   Schizoaffective disorder (HCC) Active Problems:   Polysubstance abuse   Long Term Goal(s): Improvement in symptoms so as ready for discharge  Short Term Goals: Ability to verbalize feelings will improve Ability to demonstrate self-control will improve Ability to identify and develop effective coping behaviors will improve Ability to identify triggers associated with substance abuse/mental health issues will improve Ability to demonstrate self-control will improve Ability to identify and develop effective coping behaviors will improve Ability to identify triggers associated with substance abuse/mental health issues will improve  Medication Management: Evaluate patient's response, side effects, and tolerance of medication regimen.  Therapeutic Interventions: 1 to 1 sessions, Unit Group sessions and Medication administration.  Evaluation of Outcomes: Progressing   RN Treatment Plan for Primary Diagnosis: Schizoaffective disorder (HCC) Long Term Goal(s): Knowledge of disease and therapeutic regimen to maintain health will improve  Short Term Goals: Ability to identify and develop effective coping behaviors will improve and Compliance   with prescribed medications will improve  Medication Management: RN will administer medications as ordered by provider, will assess and evaluate patient's response and provide education to patient for prescribed medication. RN will report any adverse and/or side effects to prescribing provider.  Therapeutic Interventions: 1 on 1 counseling sessions, Psychoeducation, Medication administration, Evaluate responses to treatment, Monitor vital signs and CBGs as ordered, Perform/monitor CIWA, COWS, AIMS and Fall Risk screenings as ordered, Perform wound care treatments as ordered.  Evaluation of Outcomes: Progressing   LCSW Treatment Plan for Primary Diagnosis: Schizoaffective disorder (HCC) Long Term Goal(s):  Safe transition to appropriate next level of care at discharge, Engage patient in therapeutic group addressing interpersonal concerns.  Short Term Goals: Engage patient in aftercare planning with referrals and resources  Therapeutic Interventions: Assess for all discharge needs, 1 to 1 time with Social worker, Explore available resources and support systems, Assess for adequacy in community support network, Educate family and significant other(s) on suicide prevention, Complete Psychosocial Assessment, Interpersonal group therapy.  Evaluation of Outcomes: Met  Either go to [facility] from here-referral sent and confirmed-or return home and follow up with [facility] team   Progress in Treatment: Attending groups: No Participating in groups: No Taking medication as prescribed: Yes Toleration medication: Yes, no side effects reported at this time Family/Significant other contact made: Yes ACT team Patient understands diagnosis: No Limited insight Discussing patient identified problems/goals with staff: Yes Medical problems stabilized or resolved: Yes Denies suicidal/homicidal ideation: Yes Issues/concerns per patient self-inventory: None Other: N/A  New problem(s) identified: None identified at this time.   New Short Term/Long Term Goal(s): None identified at this time.   Discharge Plan or Barriers:   Reason for Continuation of Hospitalization: Depression Hallucinations Medication stabilization Withdrawal symptoms  Estimated Length of Stay: 3-5 days  Attendees: Patient: 12/13/2016  10:12 AM  Physician: Archana Kumar, MD 12/13/2016  10:12 AM  Nursing: Elizabeth Awofadeju, RN 12/13/2016  10:12 AM  RN Care Manager: Jennifer Clark, RN 12/13/2016  10:12 AM  Social Worker: Rod Tira Lafferty 12/13/2016  10:12 AM  Recreational Therapist: Marjette Lindsey 12/13/2016  10:12 AM  Other: Delora Sutton 12/13/2016  10:12 AM  Other:  12/13/2016  10:12 AM    Scribe for Treatment Team:  Horton Ellithorpe LCSW  12/13/2016 10:12 AM   

## 2016-12-17 NOTE — Progress Notes (Signed)
[facility] Bed 1 Physician inform Nurse call report to: [phone] Patient signed voluntary form, faxed to [phone] arranged to transport at 2:00pm.    Vivi BarrackNicole Salvador Coupe, Theresia MajorsLCSWA, MSW Clinical Social Worker 5E and Psychiatric Service Line [phone] 12/17/2016  1:06 PM

## 2016-12-17 NOTE — Progress Notes (Signed)
Date: December 17, 2016 Chart reviewed for discharge orders: None found for case management. Mineola Duan,BSN,RN3, CCM/[phone] 

## 2016-12-17 NOTE — Progress Notes (Signed)
Adult Psychoeducational Group Note  Date:  12/17/2016 Time:  9:13 PM  Group Topic/Focus:  Wrap-Up Group:   The focus of this group is to help patients review their daily goal of treatment and discuss progress on daily workbooks.  Pt attended and participated in group. Pt stated his goal is to work on treatment for drugs, anger management, and work towards improving his living situation.   Berlin Hunuttle, Tito Ausmus M 12/17/2016, 9:13 PM

## 2016-12-17 NOTE — Discharge Summary (Signed)
Discharge Summary  Francisco Beard ZOX:[id number] DOB: Nov 14, 1960  PCP: Patient, No Pcp Per  Admit date: 12/15/2016 Discharge date: 12/17/2016  Time spent: >7930mins, more than 30% time spent on coordination of care   Patient is discharged to behavior health facility for SI/HI  Discharge Diagnoses:  Active Hospital Problems   Diagnosis Date Noted  . HCAP (healthcare-associated pneumonia) 12/15/2016  . Hyperlipidemia 12/15/2016  . Schizoaffective disorder (HCC) 12/10/2016  . Tobacco use disorder 11/04/2016  . BPH (benign prostatic hyperplasia) 11/04/2016  . Homicidal ideation   . HTN (hypertension) 04/17/2016  . COPD (chronic obstructive pulmonary disease) (HCC) 04/13/2016    Resolved Hospital Problems   Diagnosis Date Noted Date Resolved  No resolved problems to display.    Discharge Condition: stable  Diet recommendation: regular diet  Filed Weights   12/15/16 2127  Weight: 83.8 kg (184 lb 11.9 oz)    History of present illness:  [facility]  I have personally briefly reviewed patient's [facility]  Chief Complaint: Abdominal pain, nausea and emesis for the past 2 days.  HPI: Francisco AsalJohn Beard is a 56 y.o. male with medical history significant of anxiety, depression, bipolar disorder, schizoaffective disorder, asthmatic bronchitis, history of CHF (had normal echo in February of this year), COPD, chronic hip pain, GERD, hiatal hernia, hyperlipidemia, history of pneumonia 3, type 2 diabetes who was transferred from the behavioral health Hospital for the second day in a row for evaluation of upper abdominal pain, associated with nausea and vomiting. The patient also stated that he has been constipated and his last bowel movement was 3 days ago. He denies fever, chills, headache, chest pain, palpitations, dizziness, diaphoresis, pitting edema of the lower extremities, melena, hematochezia, dysuria, frequency or hematuria. He is currently  awake, alert, oriented 4, but mildly sedated after he was given Benadryl 25 mg IVP earlier  ED Course: Initial vital signs emergency department temperature 99.49F, pulse 59, respirations 18, blood pressure 136/83 mmHg and O2 sat 98% on room air. His urinalysis was negative. WBC 7.1, hemoglobin 12.2 g/dL and platelets [id number]. CMP showed a calcium of 8.8 mg/dL, but was otherwise negative. Lipase was 14.   Imaging: Chest radiograph today showed right basilar atelectasis. This scan of the abdomen done yesterday shows sigmoid diverticulosis, diffuse thickening and trabecular appearance of the bladder wall, punctate nonobstructive left renal upper pole stone without hydronephrosis right lung base atelectasis versus pneumonia and severe posterior arthritic changes of the right hip, findings suggestive of possible vascular necrosis of the left femoral head. Please see images and full radiology report for further detail.   Hospital Course:  Principal Problem:   HCAP (healthcare-associated pneumonia) Active Problems:   COPD (chronic obstructive pulmonary disease) (HCC)   HTN (hypertension)   Homicidal ideation   Tobacco use disorder   BPH (benign prostatic hyperplasia)   Schizoaffective disorder (HCC)   Hyperlipidemia   COPD exacerbation, possible right lower lobe pneumonia, can not rule out aspiration pneumonia, he did report vomiting prior to this:  -ct ab/pel : low chest : Right lung base atelectasis/scarring versus less likely pneumonia. -on initial exam he has diffuse wheezing, rhonchi, he is  Treated with nebs, steroid, mucinex, abx with vanc/aztreonam , home meds throphyline continued -mrsa screening negative, sputum culture pending  -symptom has much improved with above treatment , he felt much better, wheezing has resolved, he is discharged back to behavioral health with steroids taper, symbicort, spiriva, theophyline, doxycycline/flagyl (h/o pcn allergy, try to avoid  quinolones in patient on  psych meds to avoid Qt prolongation)   Abdominal pain: likely from constipation -CT ab/pel on admission: . Sigmoid diverticulosis. No evidence of bowel obstruction or active inflammation. Normal appendix.  Diffuse thickening and trabecular appearance of the bladder wall may be related to chronic bladder outlet obstruction. Correlation with urinalysis recommended to exclude UTI.  Punctate nonobstructing left renal upper pole stone. No hydronephrosis." -ua unremarkable,  -ab pain resolved after stool softener   Schizoaffective disorder (HCC) Continue Depakote 500 mg by mouth twice a day. Continue Cymbalta at 30 mg by mouth daily. Continue trazodone 50 mg by mouth at bedtime. Lorazepam 1 mg IVPB every 4 hours as needed for restlessness/anxiety  Homicidal ideation Intermittent  homicidal or suicidal ideations at this time. One to one sitter Return to behavioral health for further treatment  HTN (hypertension) Continue amlodipine 5 mg by mouth daily. Monitor blood pressure.  Tobacco use disorder Declined nicotine replacement therapy. Staff to provide smoking cessation information.  BPH (benign prostatic hyperplasia) Continue Flomax.  Hyperlipidemia Continue atorvastatin 20 mg by mouth every evening. Monitor hepatic function panel. Fasting lipid profile follow-up as an outpatient.  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 No acute fracture." Patient report he is in the process to establish care with pmd to preop eval for possible hip replacement surgery prior to this hospitalization.  Patient need follow up with pmd and ortho for this after discharge.    DVT prophylaxis:SCDs. Code Status:Full code. Family Communication:patient Disposition Plan:discharge to behavioral health Consults  called:psychiatry   Procedures:  none  Antibiotics:  vanc from admission to 7/16  Aztreonam from admission to   Discharge Exam: BP 117/71 (BP Location: Right Arm)   Pulse 67   Temp 98 F (36.7 C) (Oral)   Resp 16   Ht 5\' 11"  (1.803 m)   Wt 83.8 kg (184 lb 11.9 oz)   SpO2 94%   BMI 25.77 kg/m   General: NAD, poor dentition  Cardiovascular: RRR Respiratory: diffuse wheezing has almost resolved Abdomen: soft/NT/ND, + bs   Discharge Instructions You were cared for by a hospitalist during your hospital stay. If you have any questions about your discharge medications or the care you received while you were in the hospital after you are discharged, you can call the unit and asked to speak with the hospitalist on call if the hospitalist that took care of you is not available. Once you are discharged, your primary care physician will handle any further medical issues. Please note that NO REFILLS for any discharge medications will be authorized once you are discharged, as it is imperative that you return to your primary care physician (or establish a relationship with a primary care physician if you do not have one) for your aftercare needs so that they can reassess your need for medications and monitor your lab values.  Discharge Instructions    Diet general    Complete by:  As directed    Increase activity slowly    Complete by:  As directed      Allergies as of 12/17/2016      Reactions   Aleve [naproxen] Other (See Comments)   Heart problems so MD told him to not take   Aspirin Swelling   Ibuprofen Swelling   Penicillins Swelling   Has patient had a PCN reaction causing immediate rash, facial/tongue/throat swelling, SOB or lightheadedness with  hypotension: Yes Has patient had a PCN reaction causing severe rash involving mucus membranes or skin necrosis: Yes Has patient had a PCN reaction that required hospitalization: Yes Has patient had a PCN reaction occurring  within the last 10 years: No If all of the above answers are "NO", then may proceed with Cephalosporin use.   Seroquel [quetiapine] Other (See Comments)   In higher doses, this causes excessive sedation   Strawberry Extract Anaphylaxis   Tomato Anaphylaxis   Onion Nausea And Vomiting, Rash      Medication List    STOP taking these medications   HYDROcodone-acetaminophen 5-325 MG tablet Commonly known as:  NORCO/VICODIN   pantoprazole 40 MG tablet Commonly known as:  PROTONIX     TAKE these medications   albuterol 108 (90 Base) MCG/ACT inhaler Commonly known as:  PROVENTIL HFA;VENTOLIN HFA Inhale 1-2 puffs into the lungs every 6 (six) hours as needed for wheezing or shortness of breath.   amLODipine 5 MG tablet Commonly known as:  NORVASC Take 1 tablet (5 mg total) by mouth daily.   atorvastatin 20 MG tablet Commonly known as:  LIPITOR Take 20 mg by mouth daily.   budesonide-formoterol 80-4.5 MCG/ACT inhaler Commonly known as:  SYMBICORT Inhale 2 puffs into the lungs 2 (two) times daily.   cholecalciferol 1000 units tablet Commonly known as:  VITAMIN D Take 1,000 Units by mouth 2 (two) times daily.   divalproex 500 MG DR tablet Commonly known as:  DEPAKOTE Take 500 mg by mouth 2 (two) times daily. What changed:  Another medication with the same name was removed. Continue taking this medication, and follow the directions you see here.   doxycycline 100 MG tablet Commonly known as:  VIBRA-TABS Take 1 tablet (100 mg total) by mouth every 12 (twelve) hours.   DULoxetine 30 MG capsule Commonly known as:  CYMBALTA Take 30 mg by mouth daily.   famotidine 10 MG chewable tablet Commonly known as:  PEPCID AC Chew 10 mg by mouth 2 (two) times daily as needed for heartburn.   ferrous gluconate 324 MG tablet Commonly known as:  FERGON Take 1 tablet (324 mg total) by mouth daily with breakfast.   finasteride 5 MG tablet Commonly known as:  PROSCAR Take 1 tablet (5 mg  total) by mouth daily.   fluPHENAZine 10 MG tablet Commonly known as:  PROLIXIN Take 10-20 mg by mouth See admin instructions. Pt takes 10mg  in am, 20mg  in pm   fluticasone 50 MCG/ACT nasal spray Commonly known as:  FLONASE Place 1 spray into both nostrils daily.   folic acid 1 MG tablet Commonly known as:  FOLVITE Take 1 mg by mouth daily.   guaiFENesin 600 MG 12 hr tablet Commonly known as:  MUCINEX Take 1 tablet (600 mg total) by mouth 2 (two) times daily.   Magnesium Oxide 400 (240 Mg) MG Tabs Take 1 tablet (400 mg total) by mouth daily.   metroNIDAZOLE 500 MG tablet Commonly known as:  FLAGYL Take 1 tablet (500 mg total) by mouth every 8 (eight) hours.   ondansetron 8 MG disintegrating tablet Commonly known as:  ZOFRAN ODT Take 1 tablet (8 mg total) by mouth every 8 (eight) hours as needed for nausea or vomiting.   potassium chloride 10 MEQ tablet Commonly known as:  K-DUR Take 1 tablet (10 mEq total) by mouth daily.   predniSONE 10 MG tablet Commonly known as:  DELTASONE Label  & dispense according to the schedule below. 5 Pills  PO on day one then, 4 Pills PO on day two, 3 Pills PO on day three, 2 Pills PO on day four,1 Pills PO on day five,  then STOP.   senna-docusate 8.6-50 MG tablet Commonly known as:  Senokot-S Take 1 tablet by mouth at bedtime.   tamsulosin 0.4 MG Caps capsule Commonly known as:  FLOMAX Take 1 capsule (0.4 mg total) by mouth daily.   theophylline 300 MG 24 hr capsule Commonly known as:  THEO-24 Take 300 mg by mouth daily.   thiamine 100 MG tablet Take 1 tablet (100 mg total) by mouth daily.   tiotropium 18 MCG inhalation capsule Commonly known as:  SPIRIVA HANDIHALER Place 1 capsule (18 mcg total) into inhaler and inhale daily.   VITAMIN C PO Take 1 tablet by mouth daily.      Allergies  Allergen Reactions  . Aleve [Naproxen] Other (See Comments)    Heart problems so MD told him to not take  . Aspirin Swelling  . Ibuprofen  Swelling  . Penicillins Swelling    Has patient had a PCN reaction causing immediate rash, facial/tongue/throat swelling, SOB or lightheadedness with hypotension: Yes Has patient had a PCN reaction causing severe rash involving mucus membranes or skin necrosis: Yes Has patient had a PCN reaction that required hospitalization: Yes Has patient had a PCN reaction occurring within the last 10 years: No If all of the above answers are "NO", then may proceed with Cephalosporin use.   . Seroquel [Quetiapine] Other (See Comments)    In higher doses, this causes excessive sedation  . Strawberry Extract Anaphylaxis  . Tomato Anaphylaxis  . Onion Nausea And Vomiting and Rash   Follow-up Information    [facility] Follow up.   Contact information: [address] [place] [address] [phone]            The results of significant diagnostics from this hospitalization (including imaging, microbiology, ancillary and laboratory) are listed below for reference.    Significant Diagnostic Studies: Dg Chest 2 View  Result Date: 12/14/2016 CLINICAL DATA:  Shortness of breath EXAM: CHEST  2 VIEW COMPARISON:  11/04/2016 FINDINGS: Coarse perihilar interstitial opacity slight increased from prior. No focal consolidation or effusion. Normal heart size. No pneumothorax. IMPRESSION: Coarse perihilar interstitial opacity could relate to bronchial inflammation. No focal pneumonia. Electronically Signed   By: Jasmine Pang M.D.   On: 12/14/2016 00:52   Ct Abdomen Pelvis W Contrast  Result Date: 12/14/2016 CLINICAL DATA:  56 year old male with vomiting and diarrhea. Patient is on antibiotic. EXAM: CT ABDOMEN AND PELVIS WITH CONTRAST TECHNIQUE: Multidetector CT imaging of the abdomen and pelvis was performed using the standard protocol following bolus administration of intravenous contrast. CONTRAST:  ISOVUE-300 IOPAMIDOL (ISOVUE-300) INJECTION 61% COMPARISON:  CT dated 08/11/2016  FINDINGS: Lower chest: Right lung base subsegmental atelectasis/ scarring. Infiltrate is less likely but not excluded. Clinical correlation is recommended. No intra-abdominal free air or free fluid. Hepatobiliary: No focal liver abnormality is seen. No gallstones, gallbladder wall thickening, or biliary dilatation. Pancreas: Unremarkable. No pancreatic ductal dilatation or surrounding inflammatory changes. Spleen: Normal in size without focal abnormality. Adrenals/Urinary Tract: The adrenal glands are unremarkable. There is a punctate nonobstructing left renal upper pole stone. No hydronephrosis. The right kidney is unremarkable. There is diffuse thickened appearance the bladder wall. Correlation with urinalysis recommended to exclude cystitis. Stomach/Bowel: Small hiatal hernia. There is sigmoid diverticulosis with muscular hypertrophy. No active inflammatory changes. There is no  evidence of bowel obstruction or active inflammation. Normal appendix. Vascular/Lymphatic: The abdominal aorta and IVC appear unremarkable. The origins of the celiac axis, SMA, IMA and the origins of the renal arteries are patent. The SMV, splenic vein, and main portal vein are patent. No portal venous gas identified. There is no adenopathy. Reproductive: The prostate and seminal vesicles are grossly unremarkable. Other: None Musculoskeletal: Mild degenerate changes of the spine. Disc desiccation with vacuum phenomena at L4-L5. Severe right hip osteoarthritis with loss of superior joint space and bone-on-bone contact. There is large sub cortical cysts with mild flattening of the superior femoral head. Linear sclerotic density in the left femoral head may represent chronic changes or related to avascular necrosis. No acute fracture. IMPRESSION: 1. Sigmoid diverticulosis. No evidence of bowel obstruction or active inflammation. Normal appendix. 2. Diffuse thickening and trabecular appearance of the bladder wall may be related to chronic  bladder outlet obstruction. Correlation with urinalysis recommended to exclude UTI. 3. Punctate nonobstructing left renal upper pole stone. No hydronephrosis. 4. Right lung base atelectasis/scarring versus less likely pneumonia. 5. 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 No acute fracture. Electronically Signed   By: Elgie Collard M.D.   On: 12/14/2016 04:28   Dg Abd Acute W/chest  Result Date: 12/15/2016 CLINICAL DATA:  Vomiting yesterday and today, former smoker, asthma, COPD, CHF, diabetes mellitus, hypertension EXAM: DG ABDOMEN ACUTE W/ 1V CHEST COMPARISON:  CT abdomen and pelvis 12/14/2016, chest radiograph 12/14/2016 FINDINGS: Normal heart size, mediastinal contours, and pulmonary vascularity. RIGHT basilar atelectasis. Emphysematous changes with bullous disease at RIGHT apex. No infiltrate, pleural effusion or pneumothorax. Nonobstructive bowel gas pattern. No bowel dilatation, bowel wall thickening, or free air. Bones demineralized. Advanced degenerative changes of the RIGHT hip joint with marked joint space narrowing, sclerosis and significant lucency/subchondral cyst formation at the RIGHT femoral head along with partial femoral head partial collapse most consistent with sequela of avascular necrosis. IMPRESSION: RIGHT basilar atelectasis. Normal bowel gas pattern. 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 Electronically Signed   By: Ulyses Southward M.D.   On: 12/15/2016 16:30    Microbiology: Recent Results (from the past 240 hour(s))  Culture, sputum-assessment     Status: None   Collection Time: 12/15/16 12:40 AM  Result Value Ref Range Status   Specimen Description SPU  Final   Special Requests NONE  Final   Sputum evaluation THIS SPECIMEN IS ACCEPTABLE FOR SPUTUM CULTURE  Final   Report Status 12/17/2016 FINAL  Final  Culture,  respiratory (NON-Expectorated)     Status: None (Preliminary result)   Collection Time: 12/15/16 12:40 AM  Result Value Ref Range Status   Specimen Description SPU  Final   Special Requests NONE Reflexed from [id number]  Final   Gram Stain   Final    RARE WBC PRESENT, PREDOMINANTLY PMN RARE GRAM POSITIVE COCCI RARE GRAM POSITIVE RODS Performed at [facility], [address], [place], [place] [address]    Culture PENDING  Incomplete   Report Status PENDING  Incomplete  MRSA PCR Screening     Status: None   Collection Time: 12/16/16 10:34 AM  Result Value Ref Range Status   MRSA by PCR NEGATIVE NEGATIVE Final    Comment:        The GeneXpert MRSA Assay (FDA approved for NASAL specimens only), is one component of a comprehensive MRSA colonization  surveillance program. It is not intended to diagnose MRSA infection nor to guide or monitor treatment for MRSA infections.      Labs: Basic Metabolic Panel:  Recent Labs Lab 12/14/16 0140 12/15/16 1716 12/16/16 0519 12/17/16 0923  NA 140 140 138 139  K 3.5 3.9 3.8 3.0*  CL 109 111 111 109  CO2 22 23 20* 20*  GLUCOSE 97 90 84 167*  BUN 10 10 9 8   CREATININE 0.84 0.97 0.89 0.87  CALCIUM 8.8* 8.8* 8.5* 8.7*  MG  --   --   --  1.8   Liver Function Tests:  Recent Labs Lab 12/15/16 1716  AST 26  ALT 23  ALKPHOS 52  BILITOT 0.5  PROT 6.6  ALBUMIN 3.7    Recent Labs Lab 12/15/16 1716  LIPASE 14   No results for input(s): AMMONIA in the last 168 hours. CBC:  Recent Labs Lab 12/14/16 0140 12/15/16 1716 12/16/16 0519 12/17/16 0923  WBC 6.1 7.1 5.0 7.1  NEUTROABS 3.5 5.0  --   --   HGB 13.2 12.7* 12.2* 12.0*  HCT 39.0 37.8* 36.4* 35.3*  MCV 86.9 85.5 83.7 84.9  PLT 249 242 230 220   Cardiac Enzymes: No results for input(s): CKTOTAL, CKMB, CKMBINDEX, TROPONINI in the last 168 hours. BNP: BNP (last 3 results)  Recent Labs  04/01/16 1412  BNP 43.2    ProBNP (last 3 results) No results for  input(s): PROBNP in the last 8760 hours.  CBG: No results for input(s): GLUCAP in the last 168 hours.     SignedAlbertine Grates MD, PhD  [facility] 12/17/2016, 9:44 PM

## 2016-12-17 NOTE — Progress Notes (Signed)
Francisco RuizJohn is a 56 year old male being admitted voluntarily to [facility] from [facility] unit.  He came to the ED on 12/14/16 for homicidal ideation, abdominal pain and pneumonia [facility].  He was medically admitted and [facility] for [facility]/HI.  He has history of COPD, CHF, Chronic right hip pain, HTN, BPH and pneumonia.  He is diagnosed with Schizoaffective Disorder.  He currently denies any SI/HI or A/V hallucinations.  He continues to report chronic pain from his hips (8/10).  Oriented him to the unit.  Admission paperwork completed and signed.  Belongings searched and secured in locker # 22.  Skin assessment completed and noted dry skin on both feet, long toenails and left hand swollen from infiltration from IP medical unit.  Q 15 minute checks initiated for safety.  We will monitor the progress towards his goals.

## 2016-12-17 NOTE — [facility] Suicide Risk Assessment (Signed)
[facility] INPATIENT:  Family/Significant Other Suicide Prevention Education  Suicide Prevention Education:  Patient Refusal for Family/Significant Other Suicide Prevention Education: The patient Leanord AsalJohn Reichel has refused to provide written consent for family/significant other to be provided Family/Significant Other Suicide Prevention Education during admission and/or prior to discharge.  Physician notified.  Baldo DaubRodney B [facility] 12/17/2016, 3:05 PM

## 2016-12-17 NOTE — Progress Notes (Signed)
[facility]. No questions or concerns at this time.
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
## 2016-12-18 DIAGNOSIS — F251 Schizoaffective disorder, depressive type: Secondary | ICD-10-CM

## 2016-12-18 DIAGNOSIS — J189 Pneumonia, unspecified organism: Secondary | ICD-10-CM

## 2016-12-18 DIAGNOSIS — F17210 Nicotine dependence, cigarettes, uncomplicated: Secondary | ICD-10-CM

## 2016-12-18 DIAGNOSIS — F1910 Other psychoactive substance abuse, uncomplicated: Secondary | ICD-10-CM

## 2016-12-18 DIAGNOSIS — F1490 Cocaine use, unspecified, uncomplicated: Secondary | ICD-10-CM

## 2016-12-18 DIAGNOSIS — E785 Hyperlipidemia, unspecified: Secondary | ICD-10-CM

## 2016-12-18 MED ORDER — FAMOTIDINE 10 MG PO TABS: 10.0000 mg | ORAL_TABLET | Freq: Two times a day (BID) | ORAL | 0 refills | Status: AC | PRN

## 2016-12-18 MED ORDER — DIVALPROEX SODIUM 500 MG PO DR TAB: 500.0000 mg | DELAYED_RELEASE_TABLET | Freq: Two times a day (BID) | ORAL | 0 refills | Status: AC

## 2016-12-18 MED ORDER — VITAMIN D 1000 UNITS PO TABS: 1000.0000 [IU] | ORAL_TABLET | Freq: Two times a day (BID) | ORAL | Status: AC

## 2016-12-18 MED ORDER — PREDNISONE 20 MG PO TABS: 40.0000 mg | ORAL_TABLET | Freq: Every day | ORAL | 0 refills | Status: AC

## 2016-12-18 MED ORDER — ALBUTEROL SULFATE HFA 108 (90 BASE) MCG/ACT IN AERS: 1.0000 | INHALATION_SPRAY | Freq: Four times a day (QID) | RESPIRATORY_TRACT | 0 refills | Status: AC | PRN

## 2016-12-18 MED ORDER — FLUTICASONE PROPIONATE 50 MCG/ACT NA SUSP: 1.0000 | Freq: Every day | NASAL | 1 refills | Status: AC

## 2016-12-18 MED ORDER — DOXYCYCLINE HYCLATE 100 MG PO TABS: 100.0000 mg | ORAL_TABLET | Freq: Two times a day (BID) | ORAL | 0 refills | Status: AC

## 2016-12-18 MED ORDER — SENNOSIDES-DOCUSATE SODIUM 8.6-50 MG PO TABS: 1.0000 | ORAL_TABLET | Freq: Every day | ORAL | 0 refills | Status: AC

## 2016-12-18 MED ORDER — GUAIFENESIN ER 600 MG PO TB12: 600.0000 mg | ORAL_TABLET | Freq: Two times a day (BID) | ORAL | 0 refills | Status: AC

## 2016-12-18 MED ORDER — FOLIC ACID 1 MG PO TABS: 1.0000 mg | ORAL_TABLET | Freq: Every day | ORAL | Status: AC

## 2016-12-18 MED ORDER — TAMSULOSIN HCL 0.4 MG PO CAPS: 0.4000 mg | ORAL_CAPSULE | Freq: Every day | ORAL | 0 refills | Status: AC

## 2016-12-18 MED ORDER — METRONIDAZOLE 500 MG PO TABS: 500.0000 mg | ORAL_TABLET | Freq: Three times a day (TID) | ORAL | 0 refills | Status: AC

## 2016-12-18 MED ORDER — THEOPHYLLINE ER 300 MG PO TB12: 150.0000 mg | ORAL_TABLET | Freq: Two times a day (BID) | ORAL | 0 refills | Status: AC

## 2016-12-18 MED ORDER — ONDANSETRON 8 MG PO TBDP: 8.0000 mg | ORAL_TABLET | Freq: Three times a day (TID) | ORAL | 0 refills | Status: AC | PRN

## 2016-12-18 MED ORDER — AMLODIPINE BESYLATE 5 MG PO TABS: 5.0000 mg | ORAL_TABLET | Freq: Every day | ORAL | 0 refills | Status: AC

## 2016-12-18 MED ORDER — PREDNISONE 20 MG PO TABS: 20.0000 mg | ORAL_TABLET | Freq: Every day | ORAL | 0 refills | Status: AC

## 2016-12-18 MED ORDER — POTASSIUM CHLORIDE ER 10 MEQ PO TBCR: 10.0000 meq | EXTENDED_RELEASE_TABLET | Freq: Every day | ORAL | 0 refills | Status: AC

## 2016-12-18 MED ORDER — TIOTROPIUM BROMIDE MONOHYDRATE 18 MCG IN CAPS: 18.0000 ug | ORAL_CAPSULE | Freq: Every day | RESPIRATORY_TRACT | 0 refills | Status: AC

## 2016-12-18 MED ORDER — DULOXETINE HCL 30 MG PO CPEP: 30.0000 mg | ORAL_CAPSULE | Freq: Every day | ORAL | 0 refills | Status: AC

## 2016-12-18 MED ORDER — PREDNISONE 10 MG PO TABS: 10.0000 mg | ORAL_TABLET | Freq: Every day | ORAL | 0 refills | Status: AC

## 2016-12-18 MED ORDER — MAGNESIUM OXIDE -MG SUPPLEMENT 400 (240 MG) MG PO TABS: 400.0000 mg | ORAL_TABLET | Freq: Every day | ORAL | 0 refills | Status: AC

## 2016-12-18 MED ORDER — BUDESONIDE-FORMOTEROL FUMARATE 80-4.5 MCG/ACT IN AERO: 2.0000 | INHALATION_SPRAY | Freq: Two times a day (BID) | RESPIRATORY_TRACT | 0 refills | Status: AC

## 2016-12-18 MED ORDER — FLUPHENAZINE HCL 10 MG PO TABS: ORAL_TABLET | ORAL | 0 refills | Status: AC

## 2016-12-18 MED ORDER — PREDNISONE 10 MG PO TABS: 30.0000 mg | ORAL_TABLET | Freq: Every day | ORAL | 0 refills | Status: AC

## 2016-12-18 MED ORDER — FINASTERIDE 5 MG PO TABS: 5.0000 mg | ORAL_TABLET | Freq: Every day | ORAL | 0 refills | Status: AC

## 2016-12-18 MED ORDER — FERROUS GLUCONATE 324 (38 FE) MG PO TABS: 324.0000 mg | ORAL_TABLET | Freq: Every day | ORAL | 0 refills | Status: AC

## 2016-12-18 MED ORDER — ATORVASTATIN CALCIUM 20 MG PO TABS: 20.0000 mg | ORAL_TABLET | Freq: Every day | ORAL | 0 refills | Status: AC

## 2016-12-18 NOTE — Progress Notes (Signed)
Patient ID: Francisco AsalJohn Beard, male   DOB: [birth year], 56 y.o.   MRN: [id number] Patient discharged to home/self care in the company of his ACT team.   Patient acknowledged understanding of all discharge planning.  Patient acknowledged the receipt of all of his belongings. Patient denies SI, HI and AVH at time of discharge.

## 2016-12-18 NOTE — BHH Suicide Risk Assessment (Signed)
[facility]   Principal Problem: Schizoaffective disorder [facility](HCC) Discharge Diagnoses:  Patient Active Problem List   Diagnosis Date Noted  . Schizoaffective disorder (HCC) [F25.9] 12/10/2016    Priority: High  . Polysubstance abuse [F19.10] 07/09/2016    Priority: High  . HCAP (healthcare-associated pneumonia) [J18.9] 12/15/2016  . Hyperlipidemia [E78.5] 12/15/2016  . Acute respiratory failure (HCC) [J96.00] 11/04/2016  . Allergic rhinitis [J30.9] 11/04/2016  . Tobacco use disorder [F17.200] 11/04/2016  . BPH (benign prostatic hyperplasia) [N40.0] 11/04/2016  . Acute exacerbation of COPD with asthma (HCC) [J44.1, J45.901] 07/09/2016  . Homicidal ideation [R45.850]   . HTN (hypertension) [I10] 04/17/2016  . Chest pain [R07.9]   . COPD exacerbation (HCC) [J44.1] 04/13/2016  . COPD (chronic obstructive pulmonary disease) (HCC) [J44.9] 04/13/2016  . Avascular necrosis of bone of right hip (HCC) [M87.051] 04/08/2016  . COPD with acute exacerbation (HCC) [J44.1] 04/01/2016  . Suicidal ideations [R45.851] 08/17/2013  Patient is a 56 year old [facility] he was stabilized for his pneumonia. Patient reports that he is doing much better, no longer has any thoughts of hurting himself or others, adds that was because of his medical illness that he was feeling agitated and depressed.  Patient states on a scale of 0-10, with 0 being no symptoms in 10 being the worst, his depression is a 3 out of 10. He also denies hearing any voices, having any thoughts of hurting himself or others. He adds that he was just frustrated with his brother because he holds him responsible for the death of his mother but knows that she was sick, and there was not much that could be done. He states he still does not have a good relationship with his brother but has no thoughts of hurting him. He adds that he follow-ups with his act team regularly, likes working with them. He denies  any side effects of the medications, any concerns this morning. He also denies any paranoia, hallucinations.  Total Time spent with patient: 30 minutes  Musculoskeletal: Strength & Muscle Tone: within normal limits Gait & Station: unsteady, uses walker Patient leans: N/A  Psychiatric Specialty Exam: Review of Systems  Constitutional: Negative.  Negative for fever and malaise/fatigue.  HENT: Negative.  Negative for congestion and sore throat.   Eyes: Negative.  Negative for blurred vision, double vision, discharge and redness.  Respiratory: Negative.  Negative for cough, shortness of breath and wheezing.   Cardiovascular: Negative.  Negative for chest pain and palpitations.  Gastrointestinal: Negative.  Negative for abdominal pain, constipation, diarrhea, heartburn, nausea and vomiting.  Musculoskeletal: Positive for back pain and joint pain. Negative for falls and myalgias.  Skin: Negative.  Negative for rash.  Neurological: Negative.  Negative for dizziness, seizures, loss of consciousness, weakness and headaches.  Endo/Heme/Allergies: Negative.  Negative for environmental allergies.  Psychiatric/Behavioral: Negative.  Negative for depression, hallucinations, memory loss, substance abuse and suicidal ideas. The patient is not nervous/anxious and does not have insomnia.     Blood pressure 116/78, pulse 86, temperature 98.6 F (37 C), temperature source Oral, resp. rate 18, height 5\' 11"  (1.803 m), weight 81.6 kg (180 lb), SpO2 98 %.Body mass index is 25.1 kg/m.  General Appearance: Casual  Eye Contact::  Good  Speech:  Clear and Coherent and Normal Rate409  Volume:  Normal  Mood:  Euthymic  Affect:  Congruent and Full Range  Thought Process:  Coherent, Goal Directed and Descriptions of Associations: Intact  Orientation:  Full (Time,  Place, and Person)  Thought Content:  WDL  Suicidal Thoughts:  No  Homicidal Thoughts:  No  Memory:  Immediate;   Fair Recent;   Fair Remote;    Fair  Judgement:  Intact  Insight:  Present  Psychomotor Activity:  Normal  Concentration:  Fair  Recall:  [employer] of Knowledge:Fair  Language: Fair  Akathisia:  No  Handed:  Right  AIMS (if indicated):     Assets:  Desire for Improvement Housing Social Support  Sleep:  Number of Hours: 6.5  Cognition: WNL  ADL's:  Intact   Mental Status Per Nursing Assessment::   On Admission:  NA  Demographic Factors:  Male, Low socioeconomic status and Unemployed  Loss Factors: Decline in physical health  Historical Factors: Prior suicide attempts, Family history of mental illness or substance abuse and Impulsivity  Risk Reduction Factors:   Positive social support and Positive coping skills or problem solving skills  Continued Clinical Symptoms:  Alcohol/Substance Abuse/Dependencies More than one psychiatric diagnosis Previous Psychiatric Diagnoses and Treatments Medical Diagnoses and Treatments/Surgeries  Cognitive Features That Contribute To Risk:  None    Suicide Risk:  Minimal: No identifiable suicidal ideation.  Patients presenting with no risk factors but with morbid ruminations; may be classified as minimal risk based on the severity of the depressive symptoms  Follow-up Information    [facility], [facility] Follow up.   Why:  They asked that you call them today when you get home so they can come check in with you. Contact information: [address] [place] [address] [phone]           Plan Of Care/Follow-up recommendations:  Activity:  Tolerated Diet:  Regular Other:  Keep follow-up appointments and continue to follow with ACTT team  Nelly Rout, MD 12/18/2016, 10:51 AM

## 2016-12-18 NOTE — Progress Notes (Signed)
Recreation Therapy Notes   Date: 12/18/2016 Time: 10:00am Location: [address] Dayroom  Group Topic: Problem-Solving  Goal Area(s) Addresses:  Pts will successfully identify problems they are currently facing. Pts will successfully offer solutions to anonymous problems.  Intervention:  Pencils Printer Paper  Activity: Pts were asked to write down a current problem they are trying to solve. Pts were then given other peoples papers and asked to offer solutions for someone else problem.  Education: Problem-Solving, Discharge Planning  Education Outcome: Acknowledges understanding  Clinical Observations/Feedback: Pt did not attend group.  Rachel Meyer, Recreation Therapy Intern  Allisa Einspahr, LRT/CTRS 

## 2016-12-18 NOTE — Social Work (Signed)
Referred to [facility], is [place] resident.  Tomio Kirk, LCSW Lead Clinical Social Worker Phone:  [phone]  

## 2016-12-18 NOTE — Progress Notes (Signed)
  [facility] Discharge [facility] :  Will you be returning to the same living situation after discharge:  Yes,  home At discharge, do you have transportation home?: Yes,  bus pass Do you have the ability to pay for your medications: Yes,  MCD  Release of information consent forms completed and in the chart;  Patient's signature needed at discharge.  Patient to Follow up at: Follow-up Information    [facility] Follow up.   Why:  They asked that you call them today when you get home so they can come check in with you. Contact information: [address] [place] [address] [phone]           Next level of care provider has access to [facility] Link:no  Safety Planning and Suicide Prevention discussed: Yes,  yes  Have you used any form of tobacco in the last 30 days? (Cigarettes, Smokeless Tobacco, Cigars, and/or Pipes): Yes  Has patient been referred to the Quitline?: Patient refused referral  Patient has been referred for addiction treatment: Yes  Ida RogueRodney B Onya Eutsler 12/18/2016, 10:04 AM

## 2016-12-18 NOTE — Progress Notes (Addendum)
D: When asked what the circumstances surrounding his adm pt stated, "anger, crack and homicidal".Stated, he "feels better now, but still having "some anger". Pt asked the writer for an extra pillow, because he wasn't in an adj bed. Stated, "I feel better when I my head is elevated.   A:  Support and encouragement was offered. 15 min checks continued for safety.  R: Pt remains safe.      Addendum: Writer moved pt from 502-2 to 501-1.

## 2016-12-18 NOTE — Tx Team (Signed)
Interdisciplinary Treatment and Diagnostic Plan Update  12/13/2016 Time of Session: 10:12 AM  Francisco Beard MRN: [id number]  Principal Diagnosis: Schizoaffective disorder (HCC)  Secondary Diagnoses: Principal Problem:   Schizoaffective disorder (HCC) Active Problems:   Polysubstance abuse   Current Medications:  Current Facility-Administered Medications  Medication Dose Route Frequency Provider Last Rate Last Dose  . acetaminophen (TYLENOL) tablet 650 mg  650 mg Oral Q6H PRN Withrow, Armonie C, FNP   650 mg at 12/13/16 0951  . albuterol (PROVENTIL HFA;VENTOLIN HFA) 108 (90 Base) MCG/ACT inhaler 1-2 puff  1-2 puff Inhalation Q6H PRN Withrow, Takeo C, FNP   2 puff at 12/12/16 1603  . alum & mag hydroxide-simeth (MAALOX/MYLANTA) 200-200-20 MG/5ML suspension 30 mL  30 mL Oral Q4H PRN Withrow, Anguel C, FNP   30 mL at 12/12/16 1942  . divalproex (DEPAKOTE) DR tablet 500 mg  500 mg Oral Q12H Withrow, Tori C, FNP   500 mg at 12/13/16 0949  . doxycycline (VIBRA-TABS) tablet 100 mg  100 mg Oral Q12H Kumar, Archana, MD   100 mg at 12/13/16 0948  . DULoxetine (CYMBALTA) DR capsule 30 mg  30 mg Oral Daily Withrow, Son C, FNP   30 mg at 12/13/16 0949  . magnesium hydroxide (MILK OF MAGNESIA) suspension 30 mL  30 mL Oral Daily PRN Withrow, Brace C, FNP   30 mL at 12/13/16 0951  . tamsulosin (FLOMAX) capsule 0.4 mg  0.4 mg Oral Daily Withrow, Jorian C, FNP   0.4 mg at 12/13/16 0949  . traZODone (DESYREL) tablet 50 mg  50 mg Oral QHS PRN Withrow, Bijan C, FNP   50 mg at 12/12/16 2103    PTA Medications: Prescriptions Prior to Admission  Medication Sig Dispense Refill Last Dose  . albuterol (PROVENTIL HFA;VENTOLIN HFA) 108 (90 Base) MCG/ACT inhaler Inhale 1-2 puffs into the lungs every 4 (four) hours as needed for wheezing or shortness of breath. 18 g 2 12/09/2016 at Unknown time  . albuterol (PROVENTIL) (2.5 MG/3ML) 0.083% nebulizer solution Take 3 mLs (2.5 mg total) by nebulization every 4 (four) hours as needed  for wheezing or shortness of breath. 10 vial 0 unknown  . amLODipine (NORVASC) 5 MG tablet Take 1 tablet (5 mg total) by mouth daily. 90 tablet 0 12/08/2016 at Unknown time  . Ascorbic Acid (VITAMIN C PO) Take 1 tablet by mouth daily.   12/09/2016 at Unknown time  . atorvastatin (LIPITOR) 20 MG tablet Take 20 mg by mouth daily.   12/08/2016 at Unknown time  . budesonide-formoterol (SYMBICORT) 80-4.5 MCG/ACT inhaler Inhale 2 puffs into the lungs 2 (two) times daily. 1 Inhaler 12 Past Week at Unknown time  . cholecalciferol (VITAMIN D) 1000 units tablet Take 1,000 Units by mouth 2 (two) times daily.   12/08/2016 at Unknown time  . divalproex (DEPAKOTE) 500 MG DR tablet Take 1 tablet (500 mg total) by mouth 2 (two) times daily. (Patient not taking: Reported on 12/09/2016) 60 tablet 0 Not Taking at Unknown time  . divalproex (DEPAKOTE) 500 MG DR tablet Take 500 mg by mouth 2 (two) times daily.    12/08/2016 at 2000  . DULoxetine (CYMBALTA) 30 MG capsule Take 30 mg by mouth daily.   Past Week at Unknown time  . famotidine (PEPCID AC) 10 MG chewable tablet Chew 10 mg by mouth 2 (two) times daily as needed for heartburn.   Past Week at Unknown time  . ferrous gluconate (FERGON) 324 MG tablet Take 1 tablet (324 mg total)   Unknown time  . ferrous gluconate (FERGON) 324 MG tablet Take 1 tablet (324 mg total) by mouth daily with breakfast. 90 tablet 3 Past Week at Unknown time  . finasteride (PROSCAR) 5 MG tablet Take 1 tablet (5 mg total) by mouth daily. 30 tablet 2 Past Week at Unknown time  . fluPHENAZine (PROLIXIN) 10 MG tablet Take 10-20 mg by mouth See admin instructions. Pt takes '10mg'$  in am, '20mg'$  in pm   Past Week at Unknown time  . fluticasone (FLONASE) 50 MCG/ACT nasal spray Place 1 spray into both nostrils daily. 16 g 2 12/09/2016 at Unknown time  . folic acid (FOLVITE) 1 MG tablet Take 1 mg by mouth daily.   Past Week at Unknown time  . HYDROcodone-acetaminophen (NORCO/VICODIN) 5-325 MG tablet Take 1 tablet by mouth every 6 (six) hours as needed for moderate  pain. (Patient not taking: Reported on 12/09/2016) 20 tablet 0 Not Taking at Unknown time  . pantoprazole (PROTONIX) 40 MG tablet Take 1 tablet (40 mg total) by mouth 2 (two) times daily. 60 tablet 2 12/08/2016 at Unknown time  . tamsulosin (FLOMAX) 0.4 MG CAPS capsule Take 1 capsule (0.4 mg total) by mouth daily. 30 capsule 2 12/09/2016 at Unknown time  . theophylline (THEO-24) 300 MG 24 hr capsule Take 300 mg by mouth daily.   Past Week at Unknown time  . thiamine 100 MG tablet Take 1 tablet (100 mg total) by mouth daily. 30 tablet 0 12/09/2016 at Unknown time  . tiotropium (SPIRIVA HANDIHALER) 18 MCG inhalation capsule Place 1 capsule (18 mcg total) into inhaler and inhale daily. 60 capsule 2 12/09/2016 at Unknown time    Patient Stressors: Financial difficulties Health problems Substance abuse  Patient Strengths: Curator fund of knowledge Motivation for treatment/growth  Treatment Modalities: Medication Management, Group therapy, Case management,  1 to 1 session with clinician, Psychoeducation, Recreational therapy.   Physician Treatment Plan for Primary Diagnosis: Schizoaffective disorder ([employer]) Long Term Goal(s): Improvement in symptoms so as ready for discharge  Short Term Goals: Ability to verbalize feelings will improve Ability to demonstrate self-control will improve Ability to identify and develop effective coping behaviors will improve Ability to identify triggers associated with substance abuse/mental health issues will improve Ability to demonstrate self-control will improve Ability to identify and develop effective coping behaviors will improve Ability to identify triggers associated with substance abuse/mental health issues will improve  Medication Management: Evaluate patient's response, side effects, and tolerance of medication regimen.  Therapeutic Interventions: 1 to 1 sessions, Unit Group sessions and Medication administration.  Evaluation  of Outcomes: Adequate for d/c  Physician Treatment Plan for Secondary Diagnosis: Principal Problem:   Schizoaffective disorder ([place]) Active Problems:   Polysubstance abuse   Long Term Goal(s): Improvement in symptoms so as ready for discharge  Short Term Goals: Ability to verbalize feelings will improve Ability to demonstrate self-control will improve Ability to identify and develop effective coping behaviors will improve Ability to identify triggers associated with substance abuse/mental health issues will improve Ability to demonstrate self-control will improve Ability to identify and develop effective coping behaviors will improve Ability to identify triggers associated with substance abuse/mental health issues will improve  Medication Management: Evaluate patient's response, side effects, and tolerance of medication regimen.  Therapeutic Interventions: 1 to 1 sessions, Unit Group sessions and Medication administration.  Evaluation of Outcomes: Adequate for d/c   RN Treatment Plan for Primary Diagnosis: Schizoaffective disorder [facility]) Long Term Goal(s): Knowledge of disease and therapeutic regimen to  maintain health will improve  Short Term Goals: Ability to identify and develop effective coping behaviors will improve and Compliance with prescribed medications will improve  Medication Management: RN will administer medications as ordered by provider, will assess and evaluate patient's response and provide education to patient for prescribed medication. RN will report any adverse and/or side effects to prescribing provider.  Therapeutic Interventions: 1 on 1 counseling sessions, Psychoeducation, Medication administration, Evaluate responses to treatment, Monitor vital signs and CBGs as ordered, Perform/monitor CIWA, COWS, AIMS and Fall Risk screenings as ordered, Perform wound care treatments as ordered.  Evaluation of Outcomes: Adequate for d/c   LCSW Treatment  Plan for Primary Diagnosis: Schizoaffective disorder [facility]) Long Term Goal(s): Safe transition to appropriate next level of care at discharge, Engage patient in therapeutic group addressing interpersonal concerns.  Short Term Goals: Engage patient in aftercare planning with referrals and resources  Therapeutic Interventions: Assess for all discharge needs, 1 to 1 time with Social worker, Explore available resources and support systems, Assess for adequacy in community support network, Educate family and significant other(s) on suicide prevention, Complete Psychosocial Assessment, Interpersonal group therapy.  Evaluation of Outcomes: Met return home and follow up with [facility] ACT team   Progress in Treatment: Attending groups: No Participating in groups: No Taking medication as prescribed: Yes Toleration medication: Yes, no side effects reported at this time Family/Significant other contact made: Yes ACT team Patient understands diagnosis: No Limited insight Discussing patient identified problems/goals with staff: Yes Medical problems stabilized or resolved: Yes Denies suicidal/homicidal ideation: Yes Issues/concerns per patient self-inventory: None Other: N/A  New problem(s) identified: None identified at this time.   New Short Term/Long Term Goal(s): None identified at this time.   Discharge Plan or Barriers:   Reason for Continuation of Hospitalization:  Estimated Length of Stay: D/C today  Attendees: Patient: 12/13/2016  10:12 AM  Physician: Hampton Abbot, MD 12/13/2016  10:12 AM  Nursing: Sena Hitch, RN 12/13/2016  10:12 AM  RN Care Manager: Lars Pinks, RN 12/13/2016  10:12 AM  Social Worker: Ripley Fraise 12/13/2016  10:12 AM  Recreational Therapist: Winfield Cunas 12/13/2016  10:12 AM  Other: Norberto Sorenson 12/13/2016  10:12 AM  Other:  12/13/2016  10:12 AM    Scribe for Treatment Team:  Roque Lias LCSW 12/13/2016 10:12 AM
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
## 2016-12-19 LAB — CULTURE, RESPIRATORY: CULTURE: NORMAL

## 2016-12-19 LAB — CULTURE, RESPIRATORY W GRAM STAIN

## 2017-07-07 IMAGING — US US ABDOMEN LIMITED
1 series · 14 of 25 positions shown · non-contrast
Comparison: CT Abdomen and Pelvis 04/15/2016

CLINICAL DATA: 55-year-old male with right upper quadrant abdominal
pain for 3 days. Suggestion of layering sludge or stones in the
gallbladder on recent CT Abdomen and Pelvis. Initial encounter.

EXAM:
US ABDOMEN LIMITED - RIGHT UPPER QUADRANT

[Series 1: us abdomen limited · 0.20mm/px · 14 of 31 slices shown]
[im 1/31]
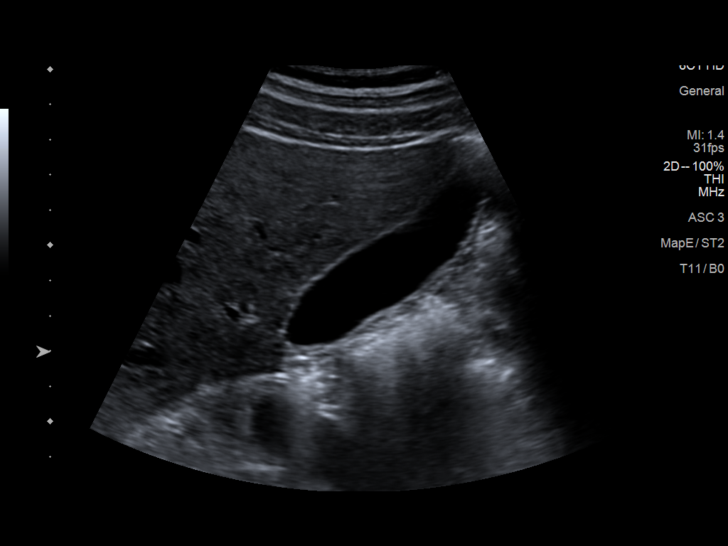
[im 3/31]
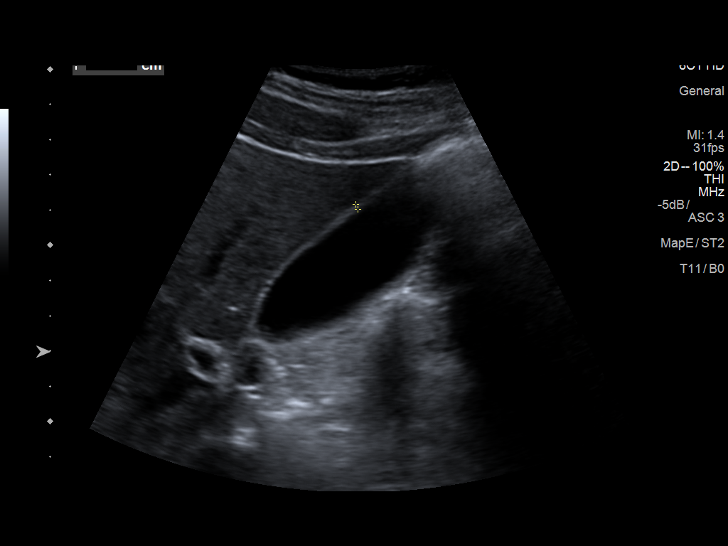
[im 6/31]
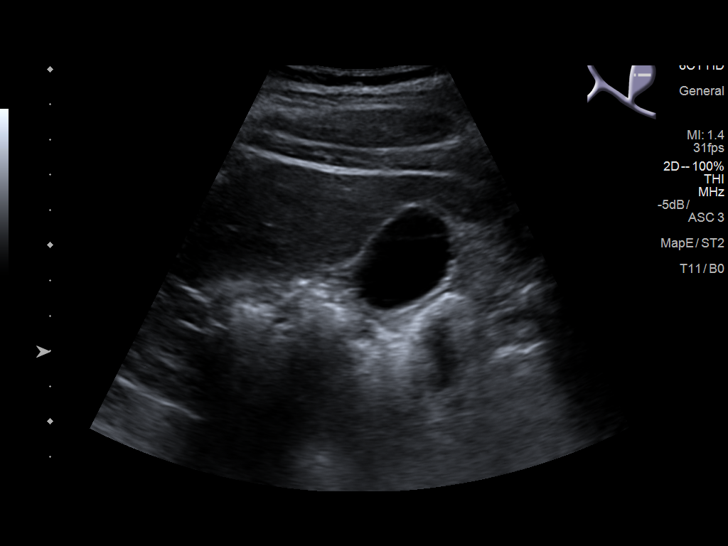
[im 8/31]
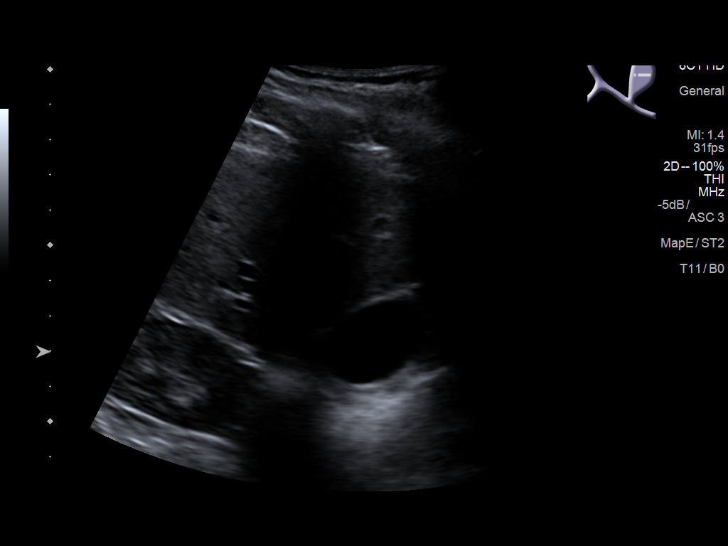
[im 11/31]
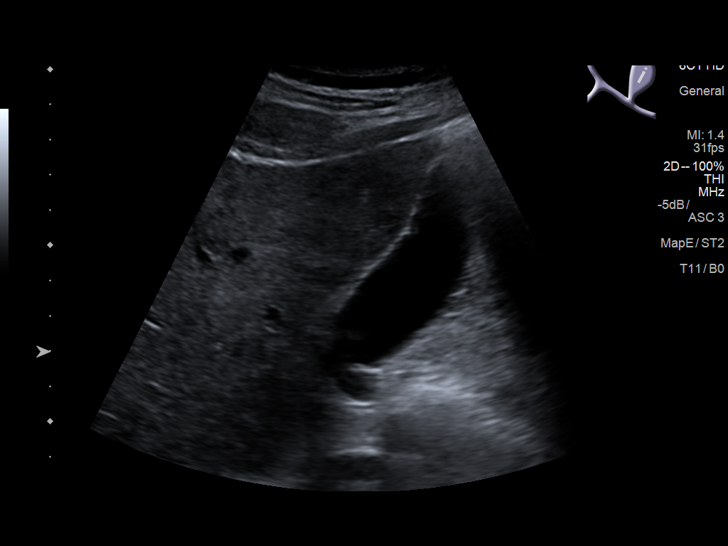
[im 12/31]
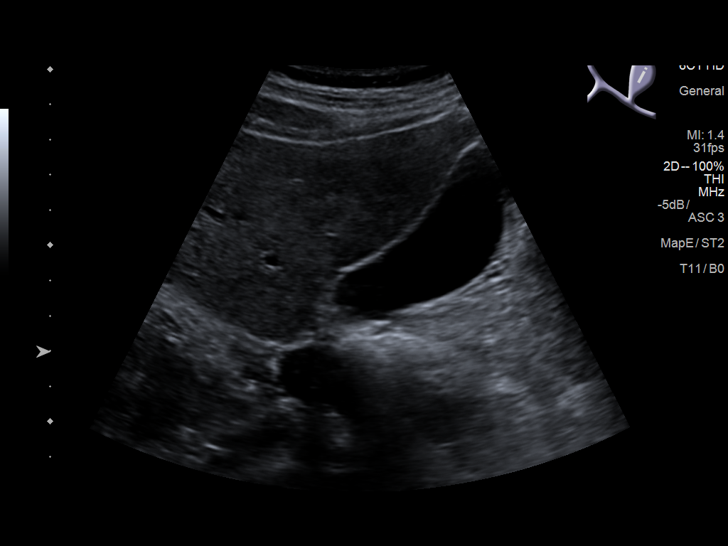
[im 14/31]
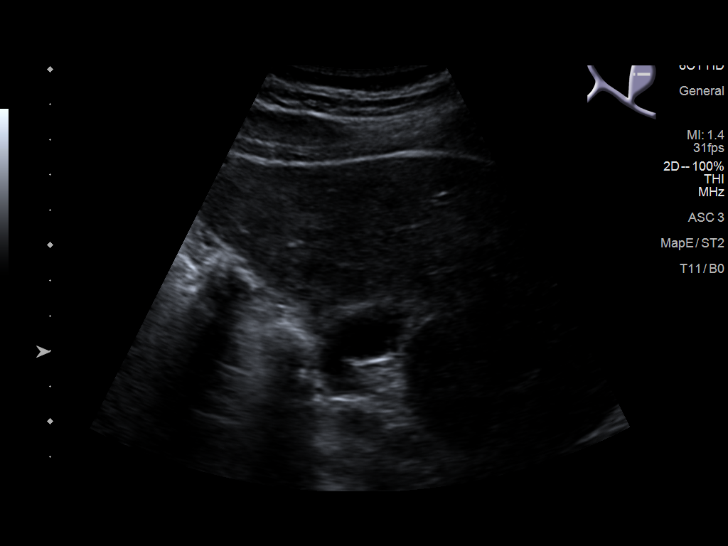
[im 17/31]
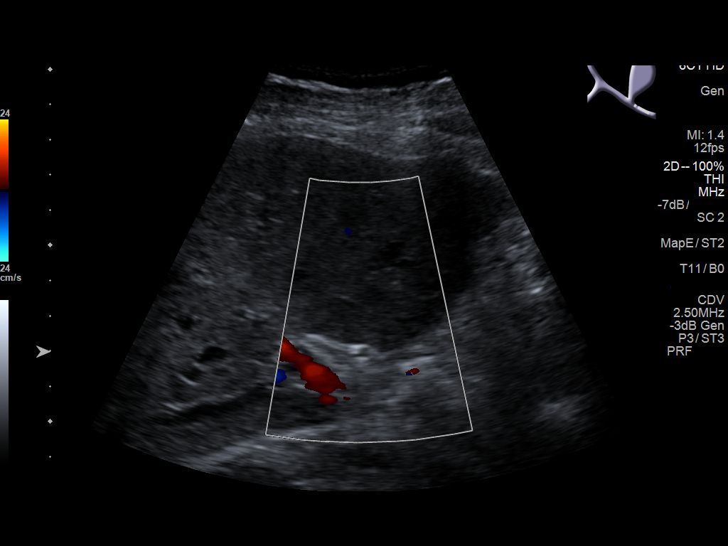
[im 19/31]
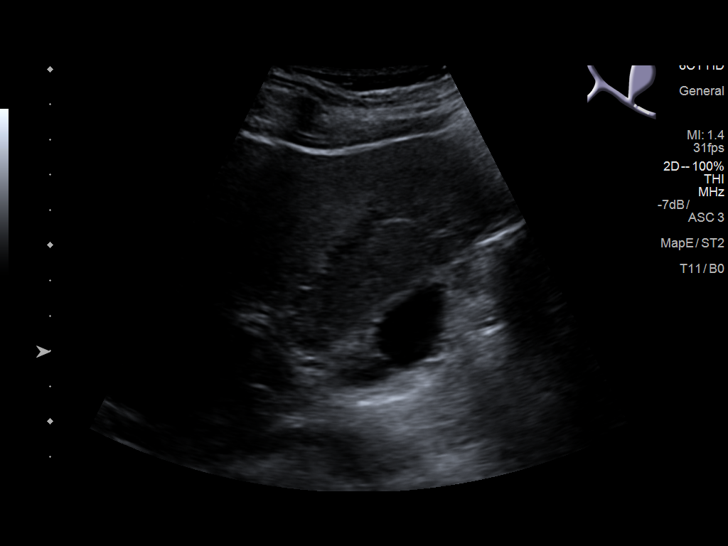
[im 21/31]
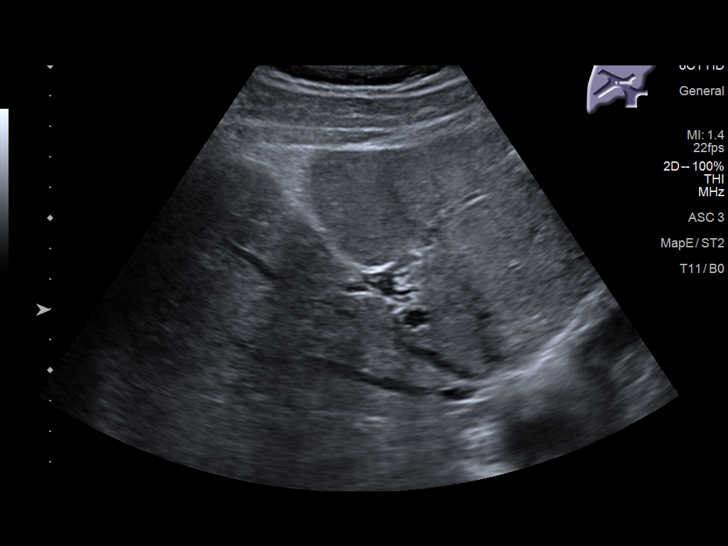
[im 23/31]
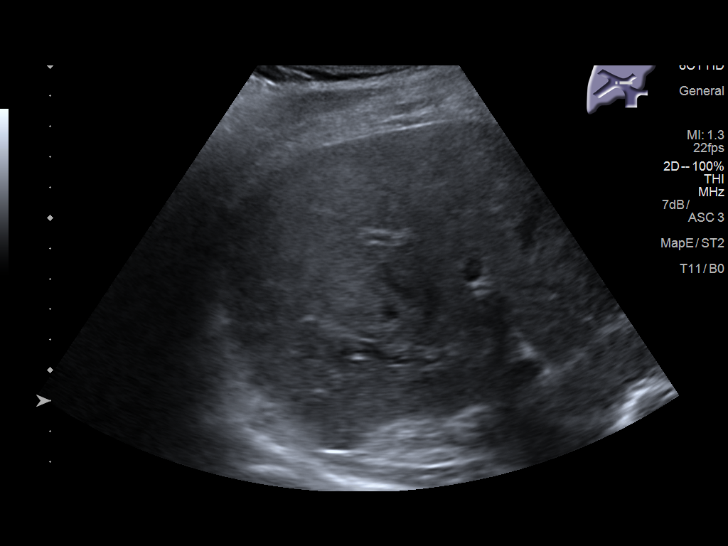
[im 26/31]
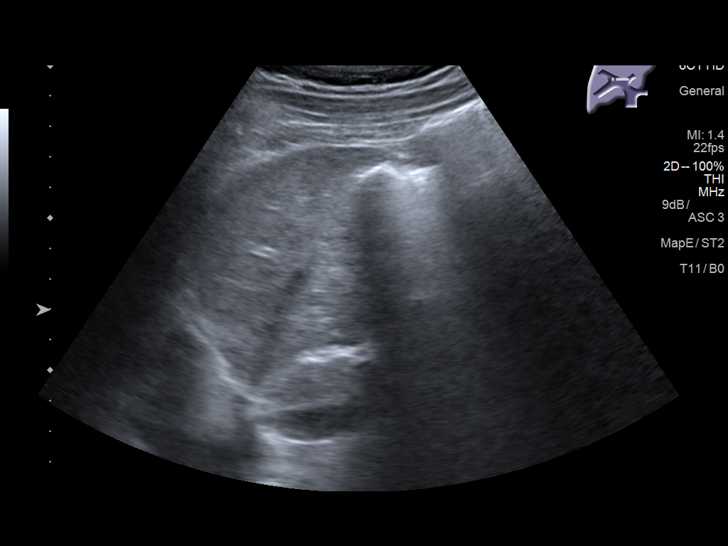
[im 28/31]
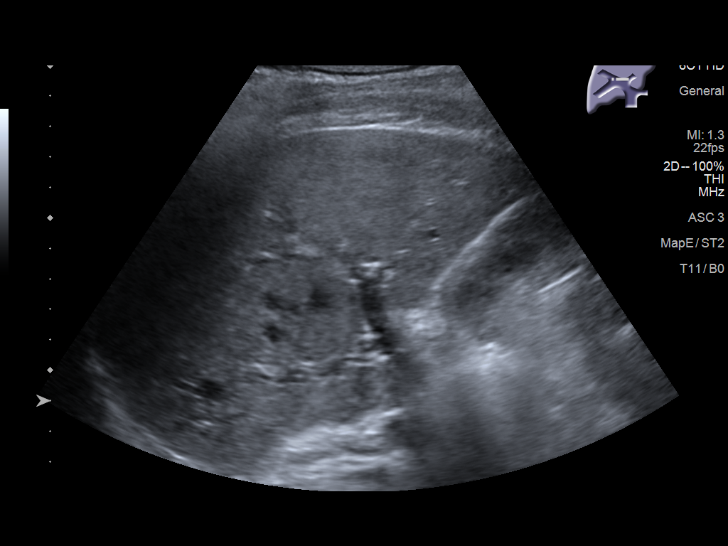
[im 31/31]
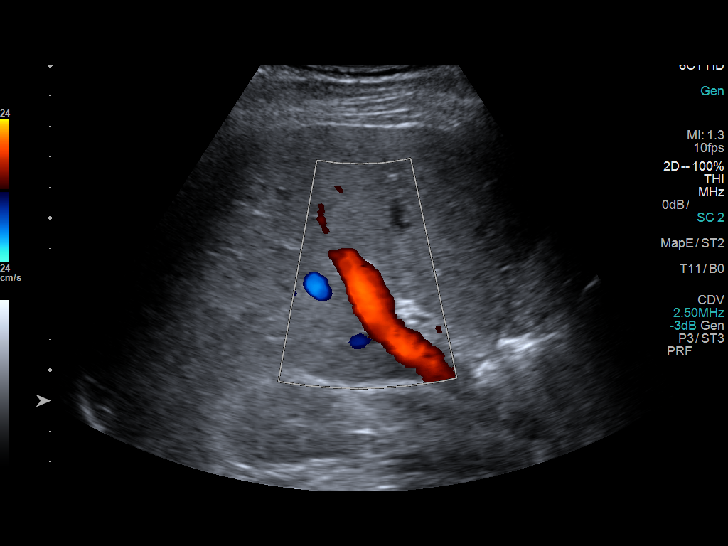

[14 of 25 positions shown; findings below may reference images not displayed]

FINDINGS: Gallbladder:

Normal. No gallstones or sludge. No wall thickening. No sonographic
Murphy sign noted by sonographer.

Common bile duct:

Diameter: 2 mm, normal

Liver:

No focal lesion identified. Within normal limits in parenchymal
echogenicity.

Other findings: Negative visible right kidney.
IMPRESSION: Normal right upper quadrant ultrasound. The gallbladder is normal;
the appearance of the gallbladder on the recent CT was artifactual.

## 2018-04-02 IMAGING — CT CT ABD-PELV W/ CM
2 of 5 series · 9 of 46 positions shown, 10 images · IV contrast (iopamidol)
Comparison: CT Abdomen and Pelvis 11/14/2015 and earlier.

CLINICAL DATA: 55-year-old male with upper abdominal pain and
nausea. Initial encounter.

EXAM:
CT ABDOMEN AND PELVIS WITH CONTRAST
TECHNIQUE: Multidetector CT imaging of the abdomen and pelvis was performed
using the standard protocol following bolus administration of
intravenous contrast.
CONTRAST:  100 mL 3EO57O-6MM IOPAMIDOL (3EO57O-6MM) INJECTION 61%

[Series 201: routine, idose (2) · axial · 0.86mm/px · z∈[+458,+798]mm · 6 of 90 slices shown, 7 images]
[im 11/90  soft-tissue]
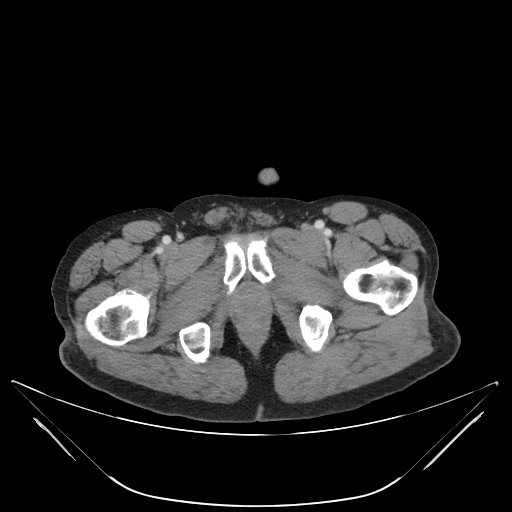
[im 11/90  bone]
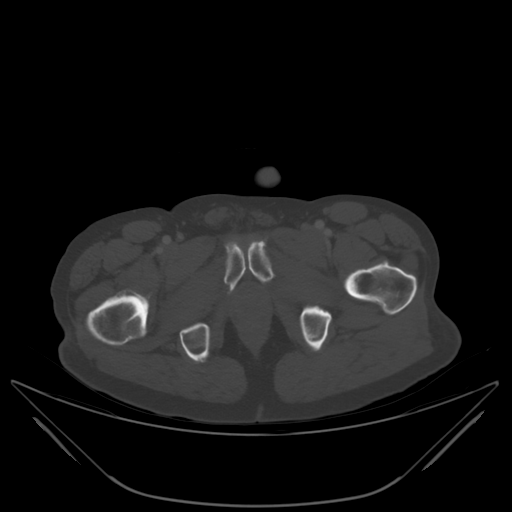
[im 27/90  soft-tissue]
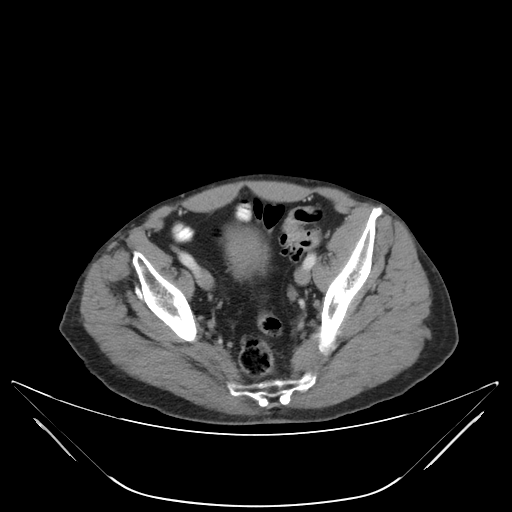
[im 37/90  soft-tissue]
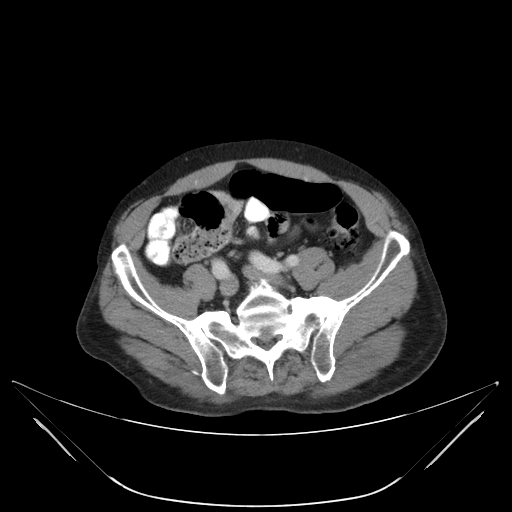
[im 53/90  soft-tissue]
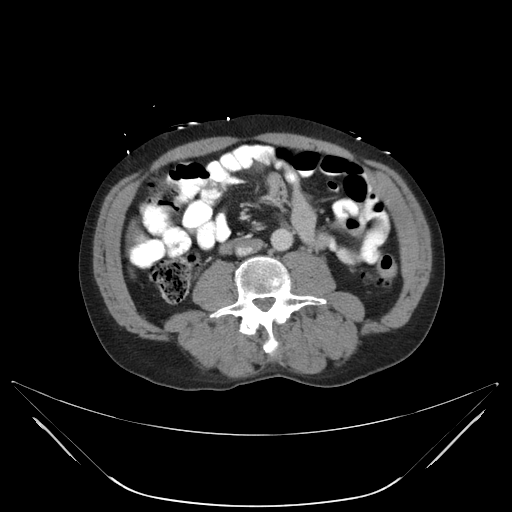
[im 69/90  soft-tissue]
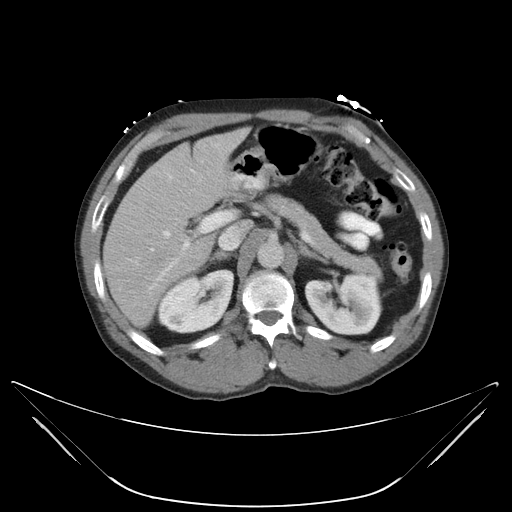
[im 79/90  soft-tissue]
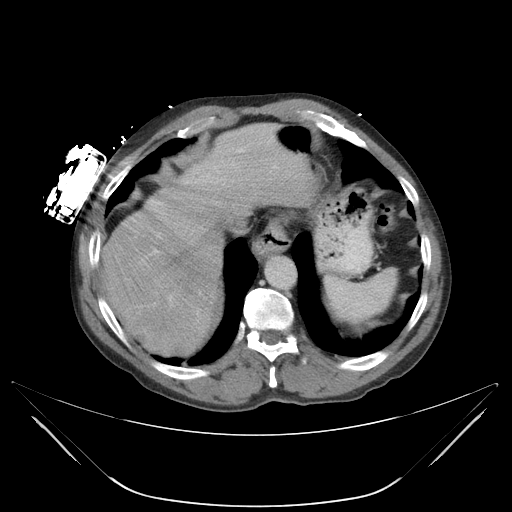

[Series 203: coronals, idose (2) · coronal · 0.45mm/px · 3 of 126 slices shown]
[im 42/126  soft-tissue]
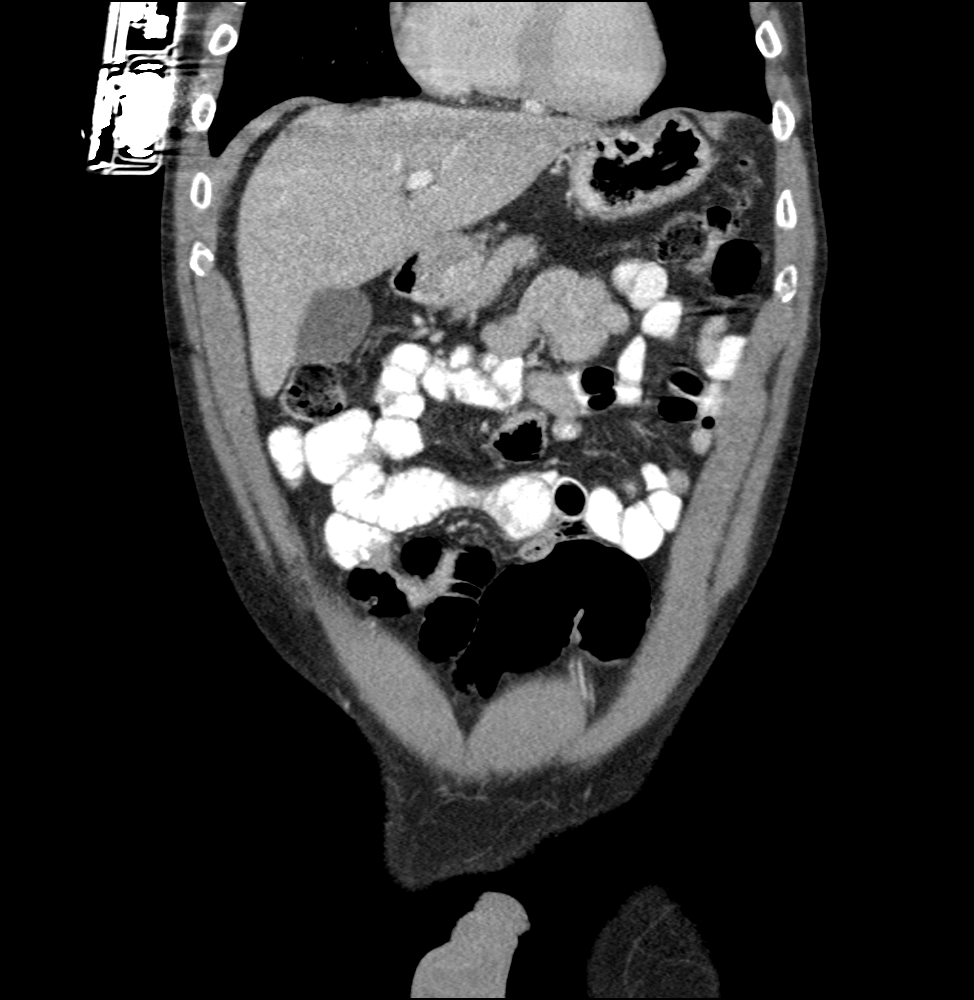
[im 56/126  soft-tissue]
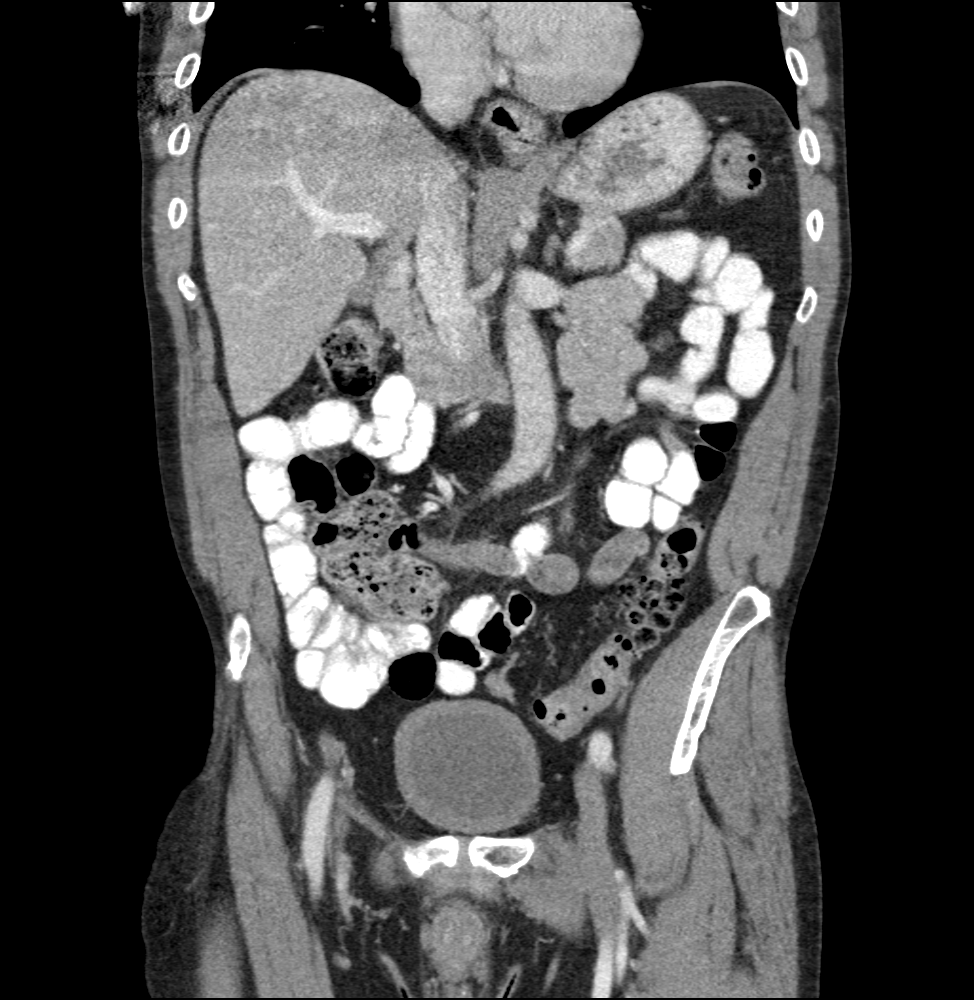
[im 70/126  soft-tissue]
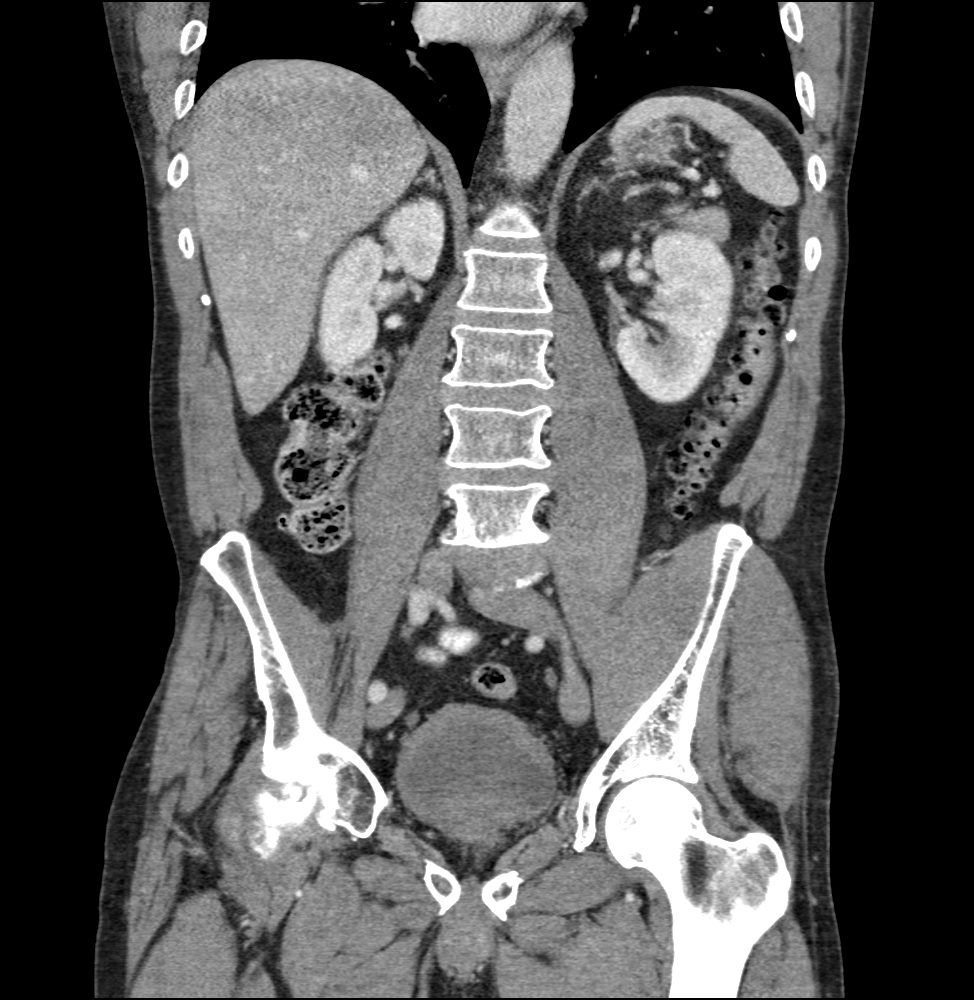

[9 of 46 positions shown; findings below may reference images not displayed]

FINDINGS: Lower chest: Improved lung volumes and bibasilar ventilation. Mild
residual atelectasis or scarring in the right costophrenic angle. No
pericardial or pleural effusion.

No upper abdominal free air.

Hepatobiliary: Stable liver with several small sub cm hypodense
areas in the dome which appear benign (such as small cysts or
biliary hamartoma is). There is dependent density in the gallbladder
which could reflect sludge or small stones, but no pericholecystic
inflammation. No biliary ductal enlargement.

Pancreas: Negative pancreas.

Spleen: Negative.

Adrenals/Urinary Tract: Negative adrenal glands.

The right double-J ureteral stent has been removed. Bilateral renal
enhancement and contrast excretion is normal. The bladder is more
decompressed which might in part account for circumferential chronic
bladder wall thickening. No definite perivesical stranding.

Stomach/Bowel: Negative rectum. Negative sigmoid colon aside from
redundancy. In the left lower quadrant beginning at the junction of
the descending and sigmoid colon and continuing throughout the
descending colon there is severe diverticulosis. No active
inflammation is identified. The left colon is decompressed. The
diverticulosis continues to the splenic flexure but relatively
spares the transverse colon otherwise. Negative transverse and right
colon aside from occasional diverticula and retained stool. Negative
appendix (coronal image 57). Oral contrast has not reached the
distal ileum. Small fecalith in the TI appears inconsequential
(series 21, image 51). Small gastric hiatal hernia is unchanged.
Otherwise negative stomach and duodenum.

Vascular/Lymphatic: Major arterial structures are patent and
negative aside from mild ectasia. Portal venous system is patent.
New line no lymphadenopathy.

Reproductive: Negative.

Other: No pelvic free fluid.

Musculoskeletal: Severe degenerative changes at the right hip again
noted. Stable visualized osseous structures.
IMPRESSION: 1. Evidence of layering sludge or stones in the gallbladder but no
CT evidence of acute cholecystitis. Recommend right upper quadrant
ultrasound follow-up in this clinical setting.
2. Severe diverticulosis throughout the left colon but no active
inflammation identified. Negative appendix. No bowel obstruction.
Stable small gastric hiatal hernia.
3. Removed right double-J ureteral stent with no adverse features or
obstructive uropathy. There is continued circumferential bladder
wall thickening which is nonspecific.

## 2018-04-03 DEATH — deceased
